# Patient Record
Sex: Male | Born: 1955 | Hispanic: No | Marital: Single | Smoking: Former smoker
Health system: Southern US, Community
[De-identification: ages and names within clinical notes are randomized; demographics above are authoritative.]

## PROBLEM LIST (undated history)

## (undated) DIAGNOSIS — E119 Type 2 diabetes mellitus without complications: Secondary | ICD-10-CM

## (undated) DIAGNOSIS — I10 Essential (primary) hypertension: Secondary | ICD-10-CM

## (undated) DIAGNOSIS — B1920 Unspecified viral hepatitis C without hepatic coma: Secondary | ICD-10-CM

## (undated) HISTORY — DX: Essential (primary) hypertension: I10

## (undated) HISTORY — DX: Unspecified viral hepatitis C without hepatic coma: B19.20

## (undated) HISTORY — DX: Type 2 diabetes mellitus without complications: E11.9

---

## 2000-11-26 ENCOUNTER — Emergency Department (HOSPITAL_COMMUNITY): Admission: EM | Admit: 2000-11-26 | Discharge: 2000-11-26 | Payer: Self-pay | Admitting: Emergency Medicine

## 2012-05-09 ENCOUNTER — Ambulatory Visit (INDEPENDENT_AMBULATORY_CARE_PROVIDER_SITE_OTHER): Payer: 59 | Admitting: Physician Assistant

## 2012-05-09 ENCOUNTER — Encounter: Payer: Self-pay | Admitting: Physician Assistant

## 2012-05-09 VITALS — BP 120/70 | HR 75 | Temp 98.3°F | Resp 18 | Ht 60.0 in | Wt 160.8 lb

## 2012-05-09 DIAGNOSIS — R768 Other specified abnormal immunological findings in serum: Secondary | ICD-10-CM | POA: Insufficient documentation

## 2012-05-09 DIAGNOSIS — J069 Acute upper respiratory infection, unspecified: Secondary | ICD-10-CM

## 2012-05-09 MED ORDER — AZITHROMYCIN 250 MG PO TABS: ORAL_TABLET | ORAL | Status: DC

## 2012-05-09 MED ORDER — MAGIC MOUTHWASH W/LIDOCAINE: 5.0000 mL | Freq: Four times a day (QID) | ORAL | Status: DC | PRN

## 2012-05-09 MED ORDER — FLUTICASONE PROPIONATE 50 MCG/ACT NA SUSP: 2.0000 | Freq: Every day | NASAL | Status: DC

## 2012-05-09 NOTE — Progress Notes (Signed)
  Subjective:    Patient ID: [name] [name], male    DOB: 1956-07-28, 56 y.o.   MRN: [id number]  HPI 56 yo male here for 2 week h/o nasal congestion, cough, occasional sneezing, sore throat.  No f/c.   Review of Systems  All other systems reviewed and are negative.       Objective:   Physical Exam  Nursing note and vitals reviewed. Constitutional: He is oriented to person, place, and time. He appears well-developed and well-nourished.  HENT:  Head: Normocephalic and atraumatic.  Mouth/Throat: No oropharyngeal exudate (throat w/erythema but no exudate).       B ears w/ congestion, no infxn. Turbinates enlarged and inflamed.  Neck: Normal range of motion. Neck supple. No thyromegaly present.  Cardiovascular: Normal rate, regular rhythm and normal heart sounds.   Pulmonary/Chest: Effort normal and breath sounds normal.  Lymphadenopathy:    He has no cervical adenopathy.  Neurological: He is alert and oriented to person, place, and time.  Skin: Skin is warm.       Assessment & Plan:  URI-cover for atypicals due to length of illness.  See meds.

## 2014-02-23 ENCOUNTER — Ambulatory Visit: Payer: 59

## 2014-02-23 ENCOUNTER — Ambulatory Visit (INDEPENDENT_AMBULATORY_CARE_PROVIDER_SITE_OTHER): Payer: 59 | Admitting: Family Medicine

## 2014-02-23 VITALS — BP 132/74 | HR 70 | Temp 98.2°F | Resp 18 | Ht 60.5 in | Wt 156.8 lb

## 2014-02-23 DIAGNOSIS — R079 Chest pain, unspecified: Secondary | ICD-10-CM

## 2014-02-23 DIAGNOSIS — J309 Allergic rhinitis, unspecified: Secondary | ICD-10-CM

## 2014-02-23 DIAGNOSIS — Z23 Encounter for immunization: Secondary | ICD-10-CM

## 2014-02-23 DIAGNOSIS — R040 Epistaxis: Secondary | ICD-10-CM

## 2014-02-23 DIAGNOSIS — M25539 Pain in unspecified wrist: Secondary | ICD-10-CM

## 2014-02-23 DIAGNOSIS — M25532 Pain in left wrist: Principal | ICD-10-CM

## 2014-02-23 DIAGNOSIS — R209 Unspecified disturbances of skin sensation: Secondary | ICD-10-CM

## 2014-02-23 DIAGNOSIS — R202 Paresthesia of skin: Secondary | ICD-10-CM

## 2014-02-23 DIAGNOSIS — M25531 Pain in right wrist: Secondary | ICD-10-CM

## 2014-02-23 DIAGNOSIS — M778 Other enthesopathies, not elsewhere classified: Secondary | ICD-10-CM

## 2014-02-23 DIAGNOSIS — G5600 Carpal tunnel syndrome, unspecified upper limb: Secondary | ICD-10-CM

## 2014-02-23 LAB — POCT CBC
Granulocyte percent: 63.1 %G (ref 37–80)
HCT, POC: 36 % — AB (ref 43.5–53.7)
Hemoglobin: 10.5 g/dL — AB (ref 14.1–18.1)
Lymph, poc: 2 (ref 0.6–3.4)
MCH, POC: 23.3 pg — AB (ref 27–31.2)
MCHC: 29.2 g/dL — AB (ref 31.8–35.4)
MCV: 80.1 fL (ref 80–97)
MID (cbc): 0.7 (ref 0–0.9)
MPV: 10.9 fL (ref 0–99.8)
POC Granulocyte: 4.5 (ref 2–6.9)
POC LYMPH PERCENT: 27.7 %L (ref 10–50)
POC MID %: 9.2 %M (ref 0–12)
Platelet Count, POC: 175 10*3/uL (ref 142–424)
RBC: 4.5 M/uL — AB (ref 4.69–6.13)
RDW, POC: 14.4 %
WBC: 7.1 10*3/uL (ref 4.6–10.2)

## 2014-02-23 MED ORDER — GABAPENTIN 300 MG PO CAPS: 300.0000 mg | ORAL_CAPSULE | Freq: Every day | ORAL | Status: DC

## 2014-02-23 MED ORDER — FLUTICASONE PROPIONATE 50 MCG/ACT NA SUSP: 2.0000 | Freq: Every day | NASAL | Status: DC

## 2014-02-23 MED ORDER — MELOXICAM 15 MG PO TABS: 15.0000 mg | ORAL_TABLET | Freq: Every day | ORAL | Status: DC

## 2014-02-23 NOTE — Patient Instructions (Addendum)
1.  START B COMPLEX VITAMIN --- ONE TABLET DAILY.  H?i Ch?ng ?ng C? Tay (Carpal Tunnel Syndrome) ?ng c? tay l m?t vng h?p n?m ? m?t lng bn tay c?a c? tay. ?ng ???c hnh thnh b?i cc x??ng c? tay v dy ch?ng. Dy th?n kinh, m?ch mu v gn ?i qua ?ng c? tay. C? ??ng c? tay l?p ?i l?p l?i ho?c cc b?nh l nh?t ??nh c th? gy s?ng ? bn trong ?ng ny. Ch? s?ng ny b ch?t dy th?n kinh chnh ? c? tay (dy th?n kinh gi?a) v gy tnh tr?ng ?au ? bn tay v cnh tay ???c g?i l h?i ch?ng ?ng c? tay.  NGUYN NHN  C? ??ng c? tay l?p ?i l?p l?i.  Ch?n th??ng c? tay.  M?t s? b?nh nh?t ??nh nh? vim kh?p, b?nh ti?u ???ng, nghi?n r??u, c??ng gip v suy th?n.  Bo ph.  Mang thai. TRI?U CH?NG  C?m gic "r?n r?n nh? ki?n b" ? cc ngn tay ho?c bn tay.  ?au bu?t ho?c t ? ngn tay ho?c bn tay.  C?m gic ?au nh?c ? ton b? cnh tay.  ?au c? tay lan ln cnh tay t?i vai.  ?au lan xu?ng lng bn tay ho?c ngn tay.  C?m gic y?u ? bn tay. CH?N ?ON Chuyn gia ch?m [place] s?c kh?e s? xem b?nh s? c?a b?n v khm th?c th?. C th? c?n ph?i ki?m tra ?i?n c? ??. Ki?m tra ny ?o tn hi?u ?i?n do cc c? g?i ?i. Cc tn hi?u ?i?n ny th??ng b? ch?m l?i b?i h?i ch?ng ?ng c? tay. B?n c?ng c th? c?n ph?i ch?p X-quang. ?I?U TR? H?i ch?ng ?ng c? tay c th? t? kh?i. Chuyn gia ch?m [place] s?c kh?e c?a b?n c th? khuy?n ngh? n?p c? tay ho?c s? d?ng thu?c, ch?ng h?n nh? thu?c khng vim khng c steroid. Tim cortison c th? h?u ch. ?i khi, c th? c?n ph?i ph?u thu?t ?? gi?i phng dy th?n kinh b? b ch?t. H??NG D?N CH?M [place] T?I NH  S? d?ng t?t c? thu?c theo ch? d?n c?a chuyn gia ch?m [place] s?c kh?e. Ch? s? d?ng thu?c khng c?n k toa ho?c thu?c c?n k toa ?? gi?m ?au, gi?m c?m gic kh ch?u ho?c h? s?t theo ch? d?n c?a chuyn gia ch?m  s?c kh?e c?a b?n.  N?u b?n ???c n?p ?? gi? cho c? tay khng b? cong, hy s? d?ng n?p theo ch? d?n. ?i?u quan tr?ng l c?n mang n?p vo ban ?m. S? d?ng n?p cho ??n  ch?ng no b?n cn b? ?au ho?c t ? bn tay, cnh tay ho?c c? tay. C th? m?t 1 ??n 2 thng.  Khng s? d?ng c? tay vo b?t k? ho?t ??ng no c th? gy ?au. N?u cc tri?u ch?ng c?a b?n c lin quan ??n cng vi?c, b?n c th? c?n ph?i ni chuy?n v?i ng??i s? d?ng lao ??ng c?a mnh v? vi?c ??i sang m?t cng vi?c khng c?n s? d?ng c? tay.  Ch??m ? l?nh ln c? tay sau th?i gian ho?t ??ng c? tay ko di.  Cho ? l?nh vo ti nh?a.  ??t kh?n t?m gi?a da v ti.  Ch??m ? l?nh trong 15 ??n 20 pht, 3 ??n 4 l?n m?i ngy.  Gi? m?i cu?c h?n khm l?i theo ch? d?n c?a chuyn gia ch?m  s?c kh?e. Cc cu?c h?n ny bao g?m b?t k? cu?c h?n chuy?n khm chuyn khoa ch?nh hnh,  v?t l tr? li?u v ph?c h?i ch?c n?ng no. B?t k? s? ch?m tr? no trong vi?c nh?n ch?m [place] c?n thi?t c th? d?n ??n s? ch?m tr? ho?c tnh tr?ng b?nh c?a b?n khng kh?i. HY NGAY L?P T?C ?I KHM N?U:  B?n c nh?ng tri?u ch?ng m?i khng r nguyn nhn.  Cc tri?u ch?ng c?a b?n tr? nn t?i t? h?n v khng c?i thi?n ho?c ki?m sot ???c b?ng thu?c. ??M B?O B?N:  Hi?u cc h??ng d?n ny.  S? theo di tnh tr?ng c?a mnh.  S? yu c?u tr? gip ngay l?p t?c n?u b?n c?m th?y khng ?? ho?c tnh tr?ng tr?m tr?ng h?n. Document Released: 12/04/2005 Document Revised: 08/06/2013 [facility] Patient Information 2014 [place], [place].

## 2014-02-23 NOTE — Progress Notes (Addendum)
Subjective:    Patient ID: [name] [name], male    DOB: Mar 20, 1956, 58 y.o.   MRN: [id number] This chart was scribed for [name] Simmer, MD by Nicholos Johns, Medical Scribe. This patient's care was started at 8:01 PM.  02/23/2014  Pain, Epistaxis and Flu Vaccine  Epistaxis    HPI Comments: [name] [name] is a 58 y.o. male who presents to the [facility] complaining of pain, tingling, weakness, and numbness in the hands and wrists; worse on the right, onset 1 month ago. Describes pain like a bee sting. Denies any injury related to these symptoms. Has taken Advil and Aleve for pain with no relief. Has not applied ice or heat.   Also reports excessive epistaxis episodes especially at night. 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 Will also have occasional HA; states he had one yesterday but does not have one today.   Reports intermittent chest pain that has been off and on since 2005. Will sometimes feel it when he rotates side to side.   Has been diagnosed with Hepatitis C.   Only child. Father still alive. States mother died in 76. Currently has a girlfriend in which they share 3 children ages 73, 31, and 58. Also has 3 children back in [place]. Quit smoking in 1994. Drinks beer occasionally. Denies drug usage. Denies any other medical problems or recent surgeries. Denies neck pain.   Pt would also like a flu vaccine.   Review of Systems  Constitutional: Negative for fever, chills, diaphoresis and fatigue.  HENT: Positive for nosebleeds.   Respiratory: Negative for cough and shortness of breath.   Cardiovascular: Positive for chest pain.  Gastrointestinal: Negative for blood in stool.  Genitourinary: Negative for hematuria.  Musculoskeletal: Positive for arthralgias (wrists). Negative for neck pain.  Skin: Negative for rash.  Neurological: Positive for headaches. Negative for dizziness, tremors, seizures, syncope,  facial asymmetry, speech difficulty, weakness, light-headedness and numbness.   History reviewed. No pertinent past medical history. No Known Allergies Current Outpatient Prescriptions  Medication Sig Dispense Refill   Alum & Mag Hydroxide-Simeth (MAGIC MOUTHWASH W/LIDOCAINE) SOLN Take 5 mLs by mouth 4 (four) times daily as needed.  120 mL  0   DM-Doxylamine-Acetaminophen (NYQUIL COLD & FLU PO) Take by mouth every 24 hours x 2 doses.       fluticasone (FLONASE) 50 MCG/ACT nasal spray Place 2 sprays into the nose daily.  16 g  6   gabapentin (NEURONTIN) 300 MG capsule Take 1 capsule (300 mg total) by mouth at bedtime.  30 capsule  1   meloxicam (MOBIC) 15 MG tablet Take 1 tablet (15 mg total) by mouth daily.  30 tablet  0   No current facility-administered medications for this visit.       Objective:    BP 132/74   Pulse 70   Temp(Src) 98.2 F (36.8 C) (Oral)   Resp 18   Ht 5' 0.5" (1.537 m)   Wt 156 lb 12.8 oz (71.124 kg)   BMI 30.11 kg/m2   SpO2 98% Physical Exam  Vitals reviewed. Constitutional: He is oriented to person, place, and time. He appears well-developed and well-nourished. No distress.  HENT:  Head: Normocephalic and atraumatic.  Right Ear: External ear normal.  Left Ear: External ear normal.  Mouth/Throat: Oropharynx is clear and moist.  Drainage in nares bilaterally.   Eyes: Conjunctivae and EOM are  normal. Pupils are equal, round, and reactive to light.  Neck: Neck supple.  Cardiovascular: Normal rate, regular rhythm and normal heart sounds.   No murmur heard. Pulmonary/Chest: Effort normal. No respiratory distress.  Abdominal: Soft. There is no tenderness.  Musculoskeletal: Normal range of motion.  Full ROM of lumbar and cervical spine. Full ROM of the shoulders. Normal supination and pronation of the hands bilaterally. Pain with flexion and extension of the lateral wrist. Mild tenderness to palpation of the lateral epicondyle bilaterally.   Lymphadenopathy:      He has no cervical adenopathy.  Neurological: He is alert and oriented to person, place, and time.  Skin: Skin is warm and dry.  Psychiatric: He has a normal mood and affect. His behavior is normal.   Results for orders placed in visit on 02/23/14  POCT CBC      Result Value Ref Range   WBC 7.1  4.6 - 10.2 K/uL   Lymph, poc 2.0  0.6 - 3.4   POC LYMPH PERCENT 27.7  10 - 50 %L   MID (cbc) 0.7  0 - 0.9   POC MID % 9.2  0 - 12 %M   POC Granulocyte 4.5  2 - 6.9   Granulocyte percent 63.1  37 - 80 %G   RBC 4.50 (*) 4.69 - 6.13 M/uL   Hemoglobin 10.5 (*) 14.1 - 18.1 g/dL   HCT, POC 47.836.0 (*) 29.543.5 - 53.7 %   MCV 80.1  80 - 97 fL   MCH, POC 23.3 (*) 27 - 31.2 pg   MCHC 29.2 (*) 31.8 - 35.4 g/dL   RDW, POC 62.114.4     Platelet Count, POC 175  142 - 424 K/uL   MPV 10.9  0 - 99.8 fL   EKG: NSR; no acute ST changes.  [facility] reading (PRIMARY) by  Dr. Katrinka BlazingSmith.  R WRIST:  NAD   Assessment & Plan:  Pain in both wrists - Plan: DG Wrist Complete Right  Paresthesias - Plan: POCT CBC  Chest pain - Plan: Comprehensive metabolic panel, EKG 12-Lead  Epistaxis - Plan: POCT CBC  Allergic rhinitis, cause unspecified  Need for prophylactic vaccination and inoculation against influenza - Plan: Flu Vaccine QUAD 36+ mos IM  Carpal tunnel syndrome  Wrist tendonitis   1. Carpal tunnel syndrome versus wrist tendonitis: New. Etiology to B wrist pain with paresthesias; rx for Meloxicam and Gabapentin provided; recommend wrist splints qhs.  If no improvement in one month, call office for ortho referral.  Recommend starting B complex daily.  2.  Chest pain: New.  EKG normal; suggestive of musculoskeletal etiology because changing positions can trigger pain. 3.  Epistaxis: New.  Recommend humidifier qhs and nasal saline each nare bid.   4. Allergic Rhinitis: uncontrolled; recommend Claritin or Zyrtec daily. 5.  S/p flu vaccine.  6. Anemia: New to this provider; will warrant repeat at next visit.  Will also  need to clarify if history of colonoscopy.  May be chronic issue for patient with hemoglobinopathy?  Language barrier thus history is difficult. 7. Hepatitis C: pt reports history of Hepatitis C; did not go into detail at this visit regarding previous treatment or referral to hepatitis clinic; will warrant clarification.    Meds ordered this encounter  Medications   meloxicam (MOBIC) 15 MG tablet    Sig: Take 1 tablet (15 mg total) by mouth daily.    Dispense:  30 tablet    Refill:  0   gabapentin (NEURONTIN)  300 MG capsule    Sig: Take 1 capsule (300 mg total) by mouth at bedtime.    Dispense:  30 capsule    Refill:  1    No Follow-up on file.  I personally performed the services described in this documentation, which was scribed in my presence.  The recorded information has been reviewed and is accurate.  [name] [name], M.D.  [facility] [address] [place], [place]  [address] [phone] phone [phone] fax

## 2014-02-24 LAB — COMPREHENSIVE METABOLIC PANEL
ALT: 31 U/L (ref 0–53)
AST: 45 U/L — ABNORMAL HIGH (ref 0–37)
Albumin: 3.7 g/dL (ref 3.5–5.2)
Alkaline Phosphatase: 84 U/L (ref 39–117)
BUN: 11 mg/dL (ref 6–23)
CO2: 25 mEq/L (ref 19–32)
Calcium: 8.5 mg/dL (ref 8.4–10.5)
Chloride: 97 mEq/L (ref 96–112)
Creat: 0.8 mg/dL (ref 0.50–1.35)
Glucose, Bld: 251 mg/dL — ABNORMAL HIGH (ref 70–99)
Potassium: 4 mEq/L (ref 3.5–5.3)
Sodium: 129 mEq/L — ABNORMAL LOW (ref 135–145)
Total Bilirubin: 0.9 mg/dL (ref 0.2–1.2)
Total Protein: 9.3 g/dL — ABNORMAL HIGH (ref 6.0–8.3)

## 2014-02-28 ENCOUNTER — Encounter: Payer: Self-pay | Admitting: *Deleted

## 2014-03-02 ENCOUNTER — Ambulatory Visit (INDEPENDENT_AMBULATORY_CARE_PROVIDER_SITE_OTHER): Payer: 59 | Admitting: Family Medicine

## 2014-03-02 VITALS — BP 114/72 | HR 64 | Temp 97.9°F | Resp 18 | Wt 158.0 lb

## 2014-03-02 DIAGNOSIS — M25539 Pain in unspecified wrist: Secondary | ICD-10-CM

## 2014-03-02 DIAGNOSIS — T148XXA Other injury of unspecified body region, initial encounter: Secondary | ICD-10-CM

## 2014-03-02 DIAGNOSIS — X503XXA Overexertion from repetitive movements, initial encounter: Secondary | ICD-10-CM

## 2014-03-02 MED ORDER — PREDNISONE 20 MG PO TABS: 40.0000 mg | ORAL_TABLET | Freq: Every day | ORAL | Status: DC

## 2014-03-02 NOTE — Progress Notes (Signed)
Patient ID: [name] [name] MRN: [id number], DOB: August 26, 1956, 58 y.o. Date of Encounter: 03/02/2014, 6:46 PM  Primary Physician: No primary provider on file.  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 Pt was seen by Dr. Katrinka Blazing one weeks ago for similar symptoms. Pt was prescribed Meloxicam and gabapentin. He states that his Epistaxis has resolved. Pt states that his right wrist pain started 1 month ago. Mr. 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 After sanding the wood he states that he experiences a throbbing pain and numbness.    No past medical history on file.   Home Meds: Prior to Admission medications   Medication Sig Start Date End Date Taking? Authorizing Provider  fluticasone (FLONASE) 50 MCG/ACT nasal spray Place 2 sprays into both nostrils daily. 02/23/14 02/23/15 Yes [name] Chick, MD  gabapentin (NEURONTIN) 300 MG capsule Take 1 capsule (300 mg total) by mouth at bedtime. 02/23/14  Yes [name] Chick, MD  meloxicam (MOBIC) 15 MG tablet Take 1 tablet (15 mg total) by mouth daily. 02/23/14  Yes [name] Chick, MD    Allergies: No Known Allergies  History   Social History   Marital Status: Single    Spouse Name: N/A    Number of Children: 3   Years of Education: N/A   Occupational History   WOOD WORK    Social History Main Topics   Smoking status: Former Smoker    Quit date: 05/09/1996   Smokeless tobacco: Never Used   Alcohol Use: Yes     Comment: social   Drug Use: No   Sexual Activity: Yes   Other Topics Concern   Not on file   Social History Narrative   Marital status: single; dating girlfriend (age 43) x 11 years.  From [place].      Children: 3 sons (12, 15, 3) in [place]; 3 children in [place].      Employment: wood work      Tobacco: none; quit 1994.      Alcohol:  Beer on weekends.    Drugs: none          Review of Systems: right wrist pain, right arm pain Constitutional: negative for chills, fever, night sweats, weight changes, or fatigue  HEENT: negative for vision changes, hearing loss, congestion, rhinorrhea, ST, epistaxis, or sinus pressure Cardiovascular: negative for chest pain or palpitations Respiratory: negative for hemoptysis, wheezing, shortness of breath, or cough Abdominal: negative for abdominal pain, nausea, vomiting, diarrhea, or constipation Dermatological: negative for rash Neurologic: negative for headache, dizziness, or syncope All other systems reviewed and are otherwise negative with the exception to those above and in the HPI.   Physical Exam: Blood pressure 114/72, pulse 64, temperature 97.9 F (36.6 C), temperature source Oral, resp. rate 18, weight 158 lb (71.668 kg), SpO2 98.00%., Body mass index is 30.34 kg/(m^2). General: Well developed, well nourished, in no acute distress. Head: Normocephalic, atraumatic, eyes without discharge, sclera non-icteric, nares are without discharge. Bilateral auditory canals clear, TM's are without perforation, pearly grey and translucent with reflective cone of light bilaterally. Oral cavity moist, posterior pharynx without exudate, erythema, peritonsillar abscess, or post nasal drip.  Neck: Supple. No thyromegaly. Full ROM. No lymphadenopathy. Lungs: Clear bilaterally to auscultation without wheezes, rales, or rhonchi. Breathing is unlabored.  Heart: RRR with S1 S2. No murmurs, rubs, or gallops appreciated. Abdomen: Soft, non-tender, non-distended with normoactive bowel sounds. No hepatomegaly. No rebound/guarding. No obvious abdominal masses. Msk:  Strength and tone normal for age. Extremities/Skin: Warm and dry. No clubbing or cyanosis. No edema. No rashes or suspicious lesions. Neuro: Alert and oriented X 3. Moves all extremities spontaneously. Gait is normal. CNII-XII grossly in tact. Psych:  Responds to  questions appropriately with a normal affect.    ASSESSMENT AND PLAN:  58 y.o. 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

## 2014-07-28 ENCOUNTER — Ambulatory Visit (INDEPENDENT_AMBULATORY_CARE_PROVIDER_SITE_OTHER): Payer: 59 | Admitting: Internal Medicine

## 2014-07-28 ENCOUNTER — Ambulatory Visit (INDEPENDENT_AMBULATORY_CARE_PROVIDER_SITE_OTHER): Payer: 59

## 2014-07-28 VITALS — BP 152/90 | HR 57 | Temp 98.5°F | Resp 20 | Ht 60.5 in | Wt 150.0 lb

## 2014-07-28 DIAGNOSIS — R1013 Epigastric pain: Secondary | ICD-10-CM

## 2014-07-28 DIAGNOSIS — D649 Anemia, unspecified: Secondary | ICD-10-CM

## 2014-07-28 DIAGNOSIS — R7309 Other abnormal glucose: Secondary | ICD-10-CM

## 2014-07-28 DIAGNOSIS — IMO0001 Reserved for inherently not codable concepts without codable children: Secondary | ICD-10-CM

## 2014-07-28 DIAGNOSIS — R030 Elevated blood-pressure reading, without diagnosis of hypertension: Secondary | ICD-10-CM

## 2014-07-28 DIAGNOSIS — E119 Type 2 diabetes mellitus without complications: Secondary | ICD-10-CM

## 2014-07-28 DIAGNOSIS — R161 Splenomegaly, not elsewhere classified: Secondary | ICD-10-CM

## 2014-07-28 DIAGNOSIS — R894 Abnormal immunological findings in specimens from other organs, systems and tissues: Secondary | ICD-10-CM

## 2014-07-28 DIAGNOSIS — R739 Hyperglycemia, unspecified: Secondary | ICD-10-CM

## 2014-07-28 DIAGNOSIS — R768 Other specified abnormal immunological findings in serum: Secondary | ICD-10-CM

## 2014-07-28 LAB — POCT CBC
Granulocyte percent: 76.9 %G (ref 37–80)
HEMATOCRIT: 41.9 % — AB (ref 43.5–53.7)
HEMOGLOBIN: 12.2 g/dL — AB (ref 14.1–18.1)
LYMPH, POC: 1.2 (ref 0.6–3.4)
MCH: 23.8 pg — AB (ref 27–31.2)
MCHC: 29.2 g/dL — AB (ref 31.8–35.4)
MCV: 81.5 fL (ref 80–97)
MID (cbc): 0.5 (ref 0–0.9)
MPV: 8.9 fL (ref 0–99.8)
POC GRANULOCYTE: 5.7 (ref 2–6.9)
POC LYMPH PERCENT: 16.1 %L (ref 10–50)
POC MID %: 7 %M (ref 0–12)
Platelet Count, POC: 126 10*3/uL — AB (ref 142–424)
RBC: 5.14 M/uL (ref 4.69–6.13)
RDW, POC: 15.3 %
WBC: 7.04 10*3/uL (ref 4.6–10.2)

## 2014-07-28 LAB — POCT GLYCOSYLATED HEMOGLOBIN (HGB A1C): HEMOGLOBIN A1C: 8

## 2014-07-28 MED ORDER — LISINOPRIL 10 MG PO TABS: 10.0000 mg | ORAL_TABLET | Freq: Every day | ORAL | Status: DC

## 2014-07-28 MED ORDER — POLYETHYLENE GLYCOL 3350 17 GM/SCOOP PO POWD: ORAL | Status: DC

## 2014-07-28 MED ORDER — ONDANSETRON HCL 4 MG PO TABS: 4.0000 mg | ORAL_TABLET | Freq: Three times a day (TID) | ORAL | Status: DC | PRN

## 2014-07-28 MED ORDER — METFORMIN HCL 500 MG PO TABS: 500.0000 mg | ORAL_TABLET | Freq: Two times a day (BID) | ORAL | Status: DC

## 2014-07-28 NOTE — Patient Instructions (Signed)
Return next Tuesday 8/18 at 10am

## 2014-07-28 NOTE — Progress Notes (Addendum)
Subjective:    Patient ID: [name] [name], male    DOB: Jan 22, 1956, 58 y.o.   MRN: [id number] This chart was scribed for Ellamae Siaobert Leilyn Frayre, MD by Evon Slackerrance Branch, ED Scribe. This Patient was seen in room 13 and the patients care was started at 8:38 PM  Abdominal Pain Associated symptoms include constipation and headaches. Pertinent negatives include no diarrhea, dysuria, fever or vomiting.   Chief Complaint  Patient presents with  . Abdominal Pain    pt stated that he is having abd pain/heartburn that started yesterday.  worse when he lays down, not able to eat or drink.    HPI Comments: [name] [name] is a 58 y.o. male who presents to the [facility] complaining of abdominal pain onset 1 day prior. HE states he has associated constipation, headache and right sided chest pain. He states that he was having some difficulty at work yesterday due to the pain. Denies vomiting, diarrhea, fever, chills, palpations, SOB, or dysuria. He states he has a Hx of Hepatitis C.  Hx Rec abd pain and HA over the years.  Review of chart shows recent visit in March where blood sugar was abnormal/he was lost to contact followup Language barrier significant for phone calls Hemoglobin 10.5 that time 1 abnormal liver test/platelets normal  Patient Active Problem List   Diagnosis Date Noted  . Hepatitis C antibody test positive 05/09/2012   chart revealed [facility] Dr. Trixie DeisNuez infectious disease 2009-genotypes 6 Cryoglobulins positive2 No followup He was immunized against hepatitis A/has antibodies to hepatitis B  Further review of the old chart Evaluation by Dr. Link SnuffereveSchwar for chronic costochondral and other joint pains 2009--low positive ANA. 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 Ellis Parentsaub's exam [phone] but f/u not in chart   Prior to Admission medications   Medication Sig Start Date End Date Taking? Authorizing Provider  aspirin-sod bicarb-citric acid (ALKA-SELTZER) 325 MG TBEF tablet Take 325 mg by mouth every 6 (six) hours as needed.   Yes Historical Provider, MD  fluticasone (FLONASE) 50 MCG/ACT nasal spray Place 2 sprays into both nostrils daily. 02/23/14 02/23/15 Yes Ethelda ChickKristi M Smith, MD  gabapentin (NEURONTIN) 300 MG capsule Take 1 capsule (300 mg total) by mouth at bedtime. 02/23/14  Yes Ethelda ChickKristi M Smith, MD  meloxicam (MOBIC) 15 MG tablet Take 1 tablet (15 mg total) by mouth daily. 02/23/14  Yes Ethelda ChickKristi M Smith, MD  predniSONE (DELTASONE) 20 MG tablet Take 2 tablets (40 mg total) by mouth daily. 03/02/14  Yes Elvina SidleKurt Lauenstein, MD    Review of Systems  Constitutional: Negative for fever and chills.  HENT: Positive for nosebleeds.   Respiratory: Negative for shortness of breath.   Cardiovascular: Positive for chest pain. Negative for palpitations.  Gastrointestinal: Positive for abdominal pain and constipation. Negative for vomiting and diarrhea.  Genitourinary: Negative for dysuria.  Neurological: Positive for headaches.    Objective:    Physical Exam  Nursing note and vitals reviewed. Constitutional: He is oriented to person, place, and time. He appears well-developed and well-nourished. No distress.  HENT:  Head: Normocephalic and atraumatic.  Eyes: Conjunctivae and EOM are normal.  Neck: Neck supple. No thyromegaly present.  Cardiovascular: Normal rate and regular rhythm.   Murmur heard.  Systolic murmur is present  Soft systolic murmur at apex of heart.   Pulmonary/Chest: Effort normal and breath sounds normal. No respiratory distress.  Abdominal: Soft. Bowel sounds are increased.  Firm mass LUQ-LMQ ? Spleen Liver not enlarged No other  masses tenderLMQ/LLQ  Musculoskeletal: Normal range of motion.  Lymphadenopathy:    He has no cervical adenopathy.  Neurological: He is alert and oriented to person, place, and time.  Skin: Skin is warm and dry.  Psychiatric: He has a normal mood and affect. His behavior is normal.   [facility] reading (PRIMARY) by  Dr. Merla Riches no obstruction/? Splenomegaly/excessive stool burden  Results for orders placed in visit on 07/28/14  COMPREHENSIVE METABOLIC PANEL      Result Value Ref Range   Sodium 133 (*) 135 - 145 mEq/L   Potassium 4.1  3.5 - 5.3 mEq/L   Chloride 101  96 - 112 mEq/L   CO2 26  19 - 32 mEq/L   Glucose, Bld 302 (*) 70 - 99 mg/dL   BUN 8  6 - 23 mg/dL   Creat 9.51  8.84 - 1.66 mg/dL   Total Bilirubin 1.1  0.2 - 1.2 mg/dL   Alkaline Phosphatase 77  39 - 117 U/L   AST 61 (*) 0 - 37 U/L   ALT 43  0 - 53 U/L   Total Protein 8.7 (*) 6.0 - 8.3 g/dL   Albumin 3.7  3.5 - 5.2 g/dL   Calcium 9.0  8.4 - 06.3 mg/dL  LIPID PANEL      Result Value Ref Range   Cholesterol 109  0 - 200 mg/dL   Triglycerides 016 (*) <150 mg/dL   HDL 35 (*) >01 mg/dL   Total CHOL/HDL Ratio 3.1     VLDL 63 (*) 0 - 40 mg/dL   LDL Cholesterol 11  0 - 99 mg/dL  POCT CBC      Result Value Ref Range   WBC 7.04  4.6 - 10.2 K/uL   Lymph, poc 1.2  0.6 - 3.4   POC LYMPH PERCENT 16.1  10 - 50 %L   MID (cbc) 0.5  0 - 0.9   POC MID % 7.0  0 - 12 %M   POC Granulocyte 5.7  2 - 6.9   Granulocyte percent 76.9  37 - 80 %G   RBC 5.14  4.69 - 6.13 M/uL   Hemoglobin 12.2 (*) 14.1 - 18.1 g/dL   HCT, POC 09.3 (*) 23.5 - 53.7 %   MCV 81.5  80 - 97 fL   MCH, POC 23.8 (*) 27 - 31.2 pg   MCHC 29.2 (*) 31.8 - 35.4 g/dL   RDW, POC 57.3     Platelet Count, POC 126 (*) 142 - 424 K/uL   MPV 8.9  0 - 99.8 fL  POCT GLYCOSYLATED HEMOGLOBIN (HGB A1C)      Result Value Ref Range   Hemoglobin A1C 8.0      45 min OV Assessment & Plan:  Abdominal pain, epigastric -  Likely secondary to constipation with possible  relationship to hyperglycemia  MiraLax twice a day til successful than once a day  Anemia, unspecified anemia type  This has varied with microcytosis at times since 1997--very likely hemoglobin E or a variant  Hep C by Hx without followup--he says he was treated but there is no record in the [facility] system  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 aspirin-sod bicarb-citric acid (ALKA-SELTZER) 325 MG TBEF tablet    Sig: Take 325 mg by mouth every 6 (six) hours as needed.  . metFORMIN (GLUCOPHAGE) 500 MG tablet    Sig: Take 1 tablet (500 mg total) by mouth 2 (two) times daily with a meal.    Dispense:  60 tablet    Refill:  0  . lisinopril (PRINIVIL,ZESTRIL) 10 MG tablet    Sig: Take 1 tablet (10 mg total) by mouth daily.    Dispense:  90 tablet    Refill:  3  . ondansetron (ZOFRAN) 4 MG tablet    Sig: Take 1 tablet (4 mg total) by mouth every 8 (eight) hours as needed for nausea or vomiting.    Dispense:  20 tablet    Refill:  0  . polyethylene glycol powder (GLYCOLAX/MIRALAX) powder    Sig: 1 scoop in 8 oz water bid for 3 days then qd for 7 days    Dispense:  255 g    Refill:  0   Reck 1 week   I have completed the patient encounter in its entirety as documented by the scribe, with editing by me where necessary. Christyanna Mckeon P. Merla Riches, M.D.

## 2014-07-29 DIAGNOSIS — D500 Iron deficiency anemia secondary to blood loss (chronic): Secondary | ICD-10-CM | POA: Insufficient documentation

## 2014-07-29 DIAGNOSIS — D638 Anemia in other chronic diseases classified elsewhere: Secondary | ICD-10-CM | POA: Insufficient documentation

## 2014-07-29 DIAGNOSIS — R161 Splenomegaly, not elsewhere classified: Secondary | ICD-10-CM | POA: Insufficient documentation

## 2014-07-29 LAB — COMPREHENSIVE METABOLIC PANEL
ALT: 43 U/L (ref 0–53)
AST: 61 U/L — ABNORMAL HIGH (ref 0–37)
Albumin: 3.7 g/dL (ref 3.5–5.2)
Alkaline Phosphatase: 77 U/L (ref 39–117)
BUN: 8 mg/dL (ref 6–23)
CO2: 26 meq/L (ref 19–32)
Calcium: 9 mg/dL (ref 8.4–10.5)
Chloride: 101 mEq/L (ref 96–112)
Creat: 0.76 mg/dL (ref 0.50–1.35)
GLUCOSE: 302 mg/dL — AB (ref 70–99)
Potassium: 4.1 mEq/L (ref 3.5–5.3)
SODIUM: 133 meq/L — AB (ref 135–145)
TOTAL PROTEIN: 8.7 g/dL — AB (ref 6.0–8.3)
Total Bilirubin: 1.1 mg/dL (ref 0.2–1.2)

## 2014-07-29 LAB — LIPID PANEL
CHOL/HDL RATIO: 3.1 ratio
Cholesterol: 109 mg/dL (ref 0–200)
HDL: 35 mg/dL — AB (ref >39)
LDL CALC: 11 mg/dL (ref 0–99)
Triglycerides: 314 mg/dL — ABNORMAL HIGH (ref <150)
VLDL: 63 mg/dL — ABNORMAL HIGH (ref 0–40)

## 2014-08-04 ENCOUNTER — Ambulatory Visit (INDEPENDENT_AMBULATORY_CARE_PROVIDER_SITE_OTHER): Payer: 59

## 2014-08-04 ENCOUNTER — Ambulatory Visit (INDEPENDENT_AMBULATORY_CARE_PROVIDER_SITE_OTHER): Payer: 59 | Admitting: Internal Medicine

## 2014-08-04 VITALS — BP 118/76 | HR 68 | Temp 97.7°F | Resp 16 | Ht 60.5 in | Wt 148.8 lb

## 2014-08-04 DIAGNOSIS — R894 Abnormal immunological findings in specimens from other organs, systems and tissues: Secondary | ICD-10-CM

## 2014-08-04 DIAGNOSIS — R079 Chest pain, unspecified: Secondary | ICD-10-CM

## 2014-08-04 DIAGNOSIS — E119 Type 2 diabetes mellitus without complications: Secondary | ICD-10-CM

## 2014-08-04 DIAGNOSIS — R51 Headache: Secondary | ICD-10-CM

## 2014-08-04 DIAGNOSIS — R768 Other specified abnormal immunological findings in serum: Secondary | ICD-10-CM

## 2014-08-04 LAB — GLUCOSE, POCT (MANUAL RESULT ENTRY): POC GLUCOSE: 154 mg/dL — AB (ref 70–99)

## 2014-08-04 MED ORDER — METFORMIN HCL 500 MG PO TABS: 500.0000 mg | ORAL_TABLET | Freq: Two times a day (BID) | ORAL | Status: DC

## 2014-08-04 NOTE — Progress Notes (Signed)
Subjective:  This chart was scribed for Tonye Pearsonobert P Sherre Wooton, MD by Charline BillsEssence Howell, ED Scribe. The patient was seen in room 4. Patient's care was started at 11:18 AM.   Patient ID: [name] [name], male    DOB: July 12, 1956, 58 y.o.   MRN: [id number]  Chief Complaint  Patient presents with  . Follow-up    abdominal pain better but headache, dizziness   HPI HPI Comments: [name] [name] is a 58 y.o. male who presents to the [facility] for a follow-up regarding abdominal pain. Pt was seen one week ago by Dr. Merla Richesoolittle for abdominal pain onset 1 day prior to visit. Pt states that abdominal pain has improved but he reports associated HA, dizziness, eye pain, visual disturbances, dry throat, R sided mild chest pain. He denies cough. Pt states that he rubs medicine on his chest with relief. Last CXR in 2001.  Pt has not eaten today. He takes 2 Metformin tablets daily.  Pt is still working.   History reviewed. No pertinent past medical history. Current Outpatient Prescriptions on File Prior to Visit  Medication Sig Dispense Refill  . aspirin-sod bicarb-citric acid (ALKA-SELTZER) 325 MG TBEF tablet Take 325 mg by mouth every 6 (six) hours as needed.      . fluticasone (FLONASE) 50 MCG/ACT nasal spray Place 2 sprays into both nostrils daily.  16 g  11  . lisinopril (PRINIVIL,ZESTRIL) 10 MG tablet Take 1 tablet (10 mg total) by mouth daily.  90 tablet  3  . metFORMIN (GLUCOPHAGE) 500 MG tablet Take 1 tablet (500 mg total) by mouth 2 (two) times daily with a meal.  60 tablet  0  . ondansetron (ZOFRAN) 4 MG tablet Take 1 tablet (4 mg total) by mouth every 8 (eight) hours as needed for nausea or vomiting.  20 tablet  0  . polyethylene glycol powder (GLYCOLAX/MIRALAX) powder 1 scoop in 8 oz water bid for 3 days then qd for 7 days  255 g  0   No current facility-administered medications on file prior to visit.   No Known Allergies  Review of Systems  Eyes: Positive for pain and visual  disturbance.  Respiratory: Negative for cough.   Cardiovascular: Positive for chest pain.  Neurological: Positive for dizziness and headaches.      Objective:   Physical Exam  Nursing note and vitals reviewed. Constitutional: He is oriented to person, place, and time. He appears well-developed and well-nourished.  HENT:  Head: Normocephalic and atraumatic.  Nose: Nose normal.  Mouth/Throat: Oropharynx is clear and moist.  Eyes: Conjunctivae and EOM are normal. Pupils are equal, round, and reactive to light.  Neck: Neck supple.  Cardiovascular: Normal rate and regular rhythm.   No murmur heard. Pulmonary/Chest: Effort normal and breath sounds normal.  Clear to ausculation   Musculoskeletal: Normal range of motion.  Neurological: He is alert and oriented to person, place, and time.  Cranial nerves 2-12 intact  Skin: Skin is warm and dry.  Psychiatric: He has a normal mood and affect. His behavior is normal.  Triage Vitals: BP 118/76  Pulse 68  Temp(Src) 97.7 F (36.5 C) (Oral)  Resp 16  Ht 5' 0.5" (1.537 m)  Wt 148 lb 12.8 oz (67.495 kg)  BMI 28.57 kg/m2  SpO2 99%     [facility] reading (PRIMARY) by  Dr Glorianna Gott= lungs are clear/no bony lesions/heart size normal   Assessment & Plan:   I personally performed the services described in this documentation, which  was scribed in my presence. The recorded information has been reviewed and is accurate.  Hepatitis C antibody test positive - Plan: HCV RNA quant rflx ultra or genotype  Type II or unspecified type diabetes mellitus without mention of complication, not stated as uncontrolled--07/28/14 -   Responding to medication/hemoglobin A1c the first week in November  Blurred vision secondary to this  Chest pain, unspecified - musculoskeletal origin  Headache-unclear etiology/use Tylenol and follow  Meds ordered this encounter  Medications  . metFORMIN (GLUCOPHAGE) 500 MG tablet    Sig: Take 1 tablet (500 mg total) by mouth 2  (two) times daily with a meal.    Dispense:  180 tablet    Refill:  0

## 2014-08-04 NOTE — Patient Instructions (Signed)
Recheck the 1st week of november

## 2014-08-05 LAB — HCV RNA QUANT RFLX ULTRA OR GENOTYP
HCV QUANT: 71893 [IU]/mL — AB (ref <15)
HCV Quantitative Log: 4.86 {Log} — ABNORMAL HIGH (ref <1.18)

## 2014-08-10 LAB — HEPATITIS C GENOTYPE: HCV Genotype: 1

## 2014-10-20 ENCOUNTER — Encounter: Payer: Self-pay | Admitting: Family Medicine

## 2015-01-15 ENCOUNTER — Other Ambulatory Visit: Payer: Self-pay | Admitting: Internal Medicine

## 2015-04-05 ENCOUNTER — Ambulatory Visit (INDEPENDENT_AMBULATORY_CARE_PROVIDER_SITE_OTHER): Payer: 59 | Admitting: Family Medicine

## 2015-04-05 ENCOUNTER — Ambulatory Visit (INDEPENDENT_AMBULATORY_CARE_PROVIDER_SITE_OTHER): Payer: 59

## 2015-04-05 VITALS — BP 118/62 | HR 67 | Temp 98.0°F | Resp 16 | Ht 60.5 in | Wt 158.4 lb

## 2015-04-05 DIAGNOSIS — R079 Chest pain, unspecified: Secondary | ICD-10-CM | POA: Diagnosis not present

## 2015-04-05 DIAGNOSIS — E119 Type 2 diabetes mellitus without complications: Secondary | ICD-10-CM | POA: Diagnosis not present

## 2015-04-05 DIAGNOSIS — Z23 Encounter for immunization: Secondary | ICD-10-CM

## 2015-04-05 DIAGNOSIS — L282 Other prurigo: Secondary | ICD-10-CM

## 2015-04-05 DIAGNOSIS — K219 Gastro-esophageal reflux disease without esophagitis: Secondary | ICD-10-CM

## 2015-04-05 DIAGNOSIS — J370 Chronic laryngitis: Secondary | ICD-10-CM | POA: Diagnosis not present

## 2015-04-05 LAB — POCT GLYCOSYLATED HEMOGLOBIN (HGB A1C): HEMOGLOBIN A1C: 6.3

## 2015-04-05 LAB — GLUCOSE, POCT (MANUAL RESULT ENTRY): POC Glucose: 278 mg/dl — AB (ref 70–99)

## 2015-04-05 MED ORDER — METFORMIN HCL 500 MG PO TABS: 500.0000 mg | ORAL_TABLET | Freq: Two times a day (BID) | ORAL | Status: DC

## 2015-04-05 MED ORDER — LISINOPRIL 10 MG PO TABS: 10.0000 mg | ORAL_TABLET | Freq: Every day | ORAL | Status: DC

## 2015-04-05 MED ORDER — OMEPRAZOLE 20 MG PO CPDR: 20.0000 mg | DELAYED_RELEASE_CAPSULE | Freq: Two times a day (BID) | ORAL | Status: AC

## 2015-04-05 MED ORDER — PREDNISONE 10 MG (21) PO TBPK
10.0000 mg | ORAL_TABLET | Freq: Every day | ORAL | Status: DC
Start: 2015-04-05 — End: 2015-04-05

## 2015-04-05 NOTE — Patient Instructions (Addendum)
Please take your medication as prescribed.  I am placing you on omeprazole to see if it helps your hoarseness, while we await the referral to the ear nose and throat doctor.  Remember to drink water.   We need to check your diabetes again in 6 months.

## 2015-04-05 NOTE — Progress Notes (Signed)
[facility] [address] [place] [address] [phone]  Date:  04/05/2015   Name:  [name] [name]   DOB:  09-10-1956   MRN:  [id number]  PCP:  No PCP Per Patient    Chief Complaint: Sore Throat; Hoarse; Diabetes; and Medication Refill   History of Present Illness:  [name] [name] is a 59 y.o. very pleasant male patient who presents with the following: [place] speaking with moderate language barrier, no translator with hotline for this dialect...  Patient complains of 4-5 months of sore throat.  He states that he has some trouble breathing, and as if something is stuck down his throat.  He does have some heartburn but no regurge.  He denies any blood in the stool.  He has no He has never felt choking.  It feels very dry.  He drinks 2-3 bottles of water per day.   He also complains of chest pain.  This is not associated with any sob, diaphoresis, nasuea, numbness or tingling.  He states he feels pain on the inside, pointing to his sternum.  It is not a palpable pain.  There is a rash flare along his sternum that itches.   He has no hx of asthma, but he was an avid smoker for decades.  He quit in 1997. Patient is here also for diabetes recheck along with medication refill.  He has been a week without metformin.  He denies nausea, vomiting, diarrhea, tremulousness, or episodes of vision changes.  He does not check his blood sugar at home.  No dizziness.  1 week of red rash along chest.  Palpitations, feels like his water in lungs.  No asthma.  Water 2-3 water bottles.      Patient Active Problem List   Diagnosis Date Noted  . 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 Type II or unspecified type diabetes mellitus without mention of complication, not stated as uncontrolled--07/28/14 07/29/2014  . Absolute anemia 07/29/2014  . Hepatitis C antibody test positive 05/09/2012    Past Medical History  Diagnosis Date  . Hepatitis C   . Diabetes mellitus without complication   .  Hypertension     History reviewed. No pertinent past surgical history.  History  Substance Use Topics  . Smoking status: Former Smoker    Quit date: 05/09/1996  . Smokeless tobacco: Never Used  . Alcohol Use: Yes     Comment: social    History reviewed. No pertinent family history.  No Known Allergies  Medication list has been reviewed and updated.  Current Outpatient Prescriptions on File Prior to Visit  Medication Sig Dispense Refill  . lisinopril (PRINIVIL,ZESTRIL) 10 MG tablet Take 1 tablet (10 mg total) by mouth daily. 90 tablet 3  . metFORMIN (GLUCOPHAGE) 500 MG tablet Take 1 tablet (500 mg total) by mouth 2 (two) times daily with a meal. PATIENT NEEDS OFFICE VISIT FOR ADDITIONAL REFILLS 60 tablet 0  . aspirin-sod bicarb-citric acid (ALKA-SELTZER) 325 MG TBEF tablet Take 325 mg by mouth every 6 (six) hours as needed.    . ondansetron (ZOFRAN) 4 MG tablet Take 1 tablet (4 mg total) by mouth every 8 (eight) hours as needed for nausea or vomiting. (Patient not taking: Reported on 04/05/2015) 20 tablet 0  . polyethylene glycol powder (GLYCOLAX/MIRALAX) powder 1 scoop in 8 oz water bid for 3 days then qd for 7 days (Patient not taking: Reported on 04/05/2015) 255 g 0   No current facility-administered medications on file prior  to visit.    Review of Systems: ROS otherwise unremarkable unless listed above.  Physical Examination: Filed Vitals:   04/05/15 1806  BP: 118/62  Pulse: 67  Temp: 98 F (36.7 C)  Resp: 16   Filed Vitals:   04/05/15 1806  Height: 5' 0.5" (1.537 m)  Weight: 158 lb 6 oz (71.838 kg)   Body mass index is 30.41 kg/(m^2). Ideal Body Weight: Weight in (lb) to have BMI = 25: 129.9  Physical Exam  Constitutional: He appears well-developed and well-nourished.  HENT:  Head: Normocephalic and atraumatic.  Right Ear: External ear and ear canal normal. Tympanic membrane is injected. Tympanic membrane is not perforated and not erythematous.  Left Ear:  External ear and ear canal normal. Tympanic membrane is injected. Tympanic membrane is not perforated and not erythematous.  Nose: Nose normal. No mucosal edema or rhinorrhea. Right sinus exhibits no maxillary sinus tenderness and no frontal sinus tenderness. Left sinus exhibits no maxillary sinus tenderness and no frontal sinus tenderness.  Mouth/Throat: Oropharynx is clear and moist. Mucous membranes are not pale and not cyanotic. No uvula swelling. No oropharyngeal exudate, posterior oropharyngeal edema or posterior oropharyngeal erythema.  Eyes: Conjunctivae and EOM are normal. Pupils are equal, round, and reactive to light.  Neck: Trachea normal. No rigidity. Normal range of motion present. No thyroid mass and no thyromegaly present.  Abnormal phonation, with raspy but almost potato-like.    Cardiovascular: Normal rate and regular rhythm.  Exam reveals no gallop, no distant heart sounds and no friction rub.   No murmur heard. Pulses:      Carotid pulses are 2+ on the right side, and 2+ on the left side.      Radial pulses are 2+ on the right side, and 2+ on the left side.  Pulmonary/Chest: No apnea. No respiratory distress. He has no decreased breath sounds. He has no wheezes. He has no rhonchi.  No stridor or tripoding.  Breath sounds are normal.  Abdominal: Soft. Normal appearance.  Skin: He is not diaphoretic.  Upper sternum with mildly erythematous raised skin without vesicles or papules.    Psychiatric: He has a normal mood and affect. His behavior is normal. Thought content normal.    Prenebulizing treatment: 330  Predicted: 556  [facility] reading (PRIMARY) by  Dr. Milus GlazierLauenstein: No acute cardiopulmonary findings.  Results for orders placed or performed in visit on 04/05/15  POCT glucose (manual entry)  Result Value Ref Range   POC Glucose 278 (A) 70 - 99 mg/dl  POCT glycosylated hemoglobin (Hb A1C)  Result Value Ref Range   Hemoglobin A1C 6.3      Assessment and Plan: 59 year  old male is here today for chief complaint of sorethroat, hoarseness, and medication refill.   Blood sugar is high, but by the a1c this is likely due to his lack of medication continuity.  Refilling metformin.  Advised for diabetes recheck in 6 months. I am treating patient with a PPI for possible laryngeal reflux, though there is concern for stricture or malignancy.  At this time, ENT is appreciated with possible endoscopy.  He was a heavy smoker in the past, and endoscopy may be warranted at this time.  Type 2 diabetes mellitus without complication - Plan: POCT glucose (manual entry), POCT glycosylated hemoglobin (Hb A1C), COMPLETE METABOLIC PANEL WITH GFR, Lipid panel, Microalbumin, urine, metFORMIN (GLUCOPHAGE) 500 MG tablet, lisinopril (PRINIVIL,ZESTRIL) 10 MG tablet, Tdap vaccine greater than or equal to 7yo IM  Chest pain, unspecified  chest pain type - Plan: EKG 12-Lead, DG Chest 2 View  Chronic laryngitis - Plan: Ambulatory referral to ENT  Gastroesophageal reflux disease, esophagitis presence not specified - Plan: omeprazole (PRILOSEC) 20 MG capsule  Need for pneumococcal vaccination - Plan: Pneumococcal polysaccharide vaccine 23-valent greater than or equal to 2yo subcutaneous/IM  Need for Tdap vaccination - Plan: Tdap vaccine greater than or equal to 7yo IM  Trena Platt, PA-C [facility] 4/19/20168:07 AM

## 2015-04-06 LAB — LIPID PANEL
Cholesterol: 109 mg/dL (ref 0–200)
HDL: 31 mg/dL — ABNORMAL LOW (ref >=40)
LDL CALC: 57 mg/dL (ref 0–99)
Total CHOL/HDL Ratio: 3.5 Ratio
Triglycerides: 103 mg/dL (ref <150)
VLDL: 21 mg/dL (ref 0–40)

## 2015-04-06 LAB — COMPLETE METABOLIC PANEL WITH GFR
ALT: 53 U/L (ref 0–53)
AST: 68 U/L — ABNORMAL HIGH (ref 0–37)
Albumin: 3.5 g/dL (ref 3.5–5.2)
Alkaline Phosphatase: 75 U/L (ref 39–117)
BUN: 9 mg/dL (ref 6–23)
CALCIUM: 8.8 mg/dL (ref 8.4–10.5)
CHLORIDE: 101 meq/L (ref 96–112)
CO2: 26 meq/L (ref 19–32)
Creat: 0.83 mg/dL (ref 0.50–1.35)
GLUCOSE: 230 mg/dL — AB (ref 70–99)
Potassium: 4 mEq/L (ref 3.5–5.3)
SODIUM: 133 meq/L — AB (ref 135–145)
TOTAL PROTEIN: 8.3 g/dL (ref 6.0–8.3)
Total Bilirubin: 1.3 mg/dL — ABNORMAL HIGH (ref 0.2–1.2)

## 2015-04-06 LAB — MICROALBUMIN, URINE: Microalb, Ur: 8.5 mg/dL — ABNORMAL HIGH (ref <2.0)

## 2015-04-06 MED ORDER — TRIAMCINOLONE ACETONIDE 0.1 % EX CREA: 1.0000 "application " | TOPICAL_CREAM | Freq: Two times a day (BID) | CUTANEOUS | Status: AC

## 2015-04-15 ENCOUNTER — Encounter: Payer: Self-pay | Admitting: Family Medicine

## 2015-09-09 ENCOUNTER — Encounter (HOSPITAL_COMMUNITY): Payer: Self-pay | Admitting: Emergency Medicine

## 2015-09-09 ENCOUNTER — Inpatient Hospital Stay (HOSPITAL_COMMUNITY)
Admission: EM | Admit: 2015-09-09 | Discharge: 2015-09-12 | DRG: 394 | Disposition: A | Discharge status: Home / Self Care | Payer: 59 | Attending: Internal Medicine | Admitting: Internal Medicine

## 2015-09-09 DIAGNOSIS — E119 Type 2 diabetes mellitus without complications: Secondary | ICD-10-CM | POA: Diagnosis present

## 2015-09-09 DIAGNOSIS — Z79899 Other long term (current) drug therapy: Secondary | ICD-10-CM

## 2015-09-09 DIAGNOSIS — K6289 Other specified diseases of anus and rectum: Principal | ICD-10-CM | POA: Diagnosis present

## 2015-09-09 DIAGNOSIS — D638 Anemia in other chronic diseases classified elsewhere: Secondary | ICD-10-CM | POA: Diagnosis present

## 2015-09-09 DIAGNOSIS — K602 Anal fissure, unspecified: Secondary | ICD-10-CM | POA: Diagnosis present

## 2015-09-09 DIAGNOSIS — E871 Hypo-osmolality and hyponatremia: Secondary | ICD-10-CM | POA: Diagnosis present

## 2015-09-09 DIAGNOSIS — R339 Retention of urine, unspecified: Secondary | ICD-10-CM | POA: Insufficient documentation

## 2015-09-09 DIAGNOSIS — D696 Thrombocytopenia, unspecified: Secondary | ICD-10-CM | POA: Diagnosis present

## 2015-09-09 DIAGNOSIS — Z87891 Personal history of nicotine dependence: Secondary | ICD-10-CM

## 2015-09-09 DIAGNOSIS — R740 Nonspecific elevation of levels of transaminase and lactic acid dehydrogenase [LDH]: Secondary | ICD-10-CM | POA: Diagnosis present

## 2015-09-09 DIAGNOSIS — B1920 Unspecified viral hepatitis C without hepatic coma: Secondary | ICD-10-CM | POA: Diagnosis present

## 2015-09-09 DIAGNOSIS — K5900 Constipation, unspecified: Secondary | ICD-10-CM | POA: Diagnosis present

## 2015-09-09 DIAGNOSIS — E860 Dehydration: Secondary | ICD-10-CM | POA: Diagnosis present

## 2015-09-09 DIAGNOSIS — I10 Essential (primary) hypertension: Secondary | ICD-10-CM | POA: Diagnosis present

## 2015-09-09 DIAGNOSIS — R161 Splenomegaly, not elsewhere classified: Secondary | ICD-10-CM | POA: Diagnosis present

## 2015-09-09 DIAGNOSIS — D72829 Elevated white blood cell count, unspecified: Secondary | ICD-10-CM | POA: Diagnosis present

## 2015-09-09 MED ORDER — LORAZEPAM 2 MG/ML IJ SOLN
0.5000 mg | Freq: Once | INTRAMUSCULAR | Status: AC
  Administered 2015-09-10: 0.5 mg via INTRAVENOUS
  Filled 2015-09-09: qty 1

## 2015-09-09 MED ORDER — SODIUM CHLORIDE 0.9 % IV BOLUS (SEPSIS)
500.0000 mL | Freq: Once | INTRAVENOUS | Status: AC
  Administered 2015-09-10: 500 mL via INTRAVENOUS

## 2015-09-09 NOTE — ED Notes (Signed)
Per EMS, patient came from home complaining of abdominal pain.  His abdomen is distended.  Pt states he can't urinate or use restroom since 1 pm today.  Pt took 1 Dulocax without any relief.  Pt also complains of lower back pain.  Denies n/v or diarrhea.  Pt states his stool normal.    No IV or medication given route.  BP: 154/64 P:96 R: 20

## 2015-09-09 NOTE — ED Provider Notes (Signed)
CSN: [id number]   Arrival date & time 09/09/15 2256  History  This chart was scribed for Gerhard Munch, MD by Bethel Born, ED Scribe. This patient was seen in room WA03/WA03 and the patient's care was started at 11:24 PM.  Chief Complaint  Patient presents with  . Abdominal Pain    HPI The history is provided by the patient. No language interpreter was used.   [name] [name] is a 59 y.o. male with PMHx of DM, HTN, and hepatitis C who presents to the Emergency Department complaining of new urinary retention with onset 10 hours ago. Associated symptoms include severe lower abdominal pain, perineal pain, and rectal pain. He has not had a bowel movement today and notes very hard stool yesterday. Pt denies vomiting.   Past Medical History  Diagnosis Date  . Hepatitis C   . Diabetes mellitus without complication   . Hypertension     History reviewed. No pertinent past surgical history.  No family history on file.  Social History  Substance Use Topics  . Smoking status: Former Smoker    Quit date: 05/09/1996  . Smokeless tobacco: Never Used  . Alcohol Use: Yes     Comment: social     Review of Systems  Constitutional:       Per HPI, otherwise negative  HENT:       Per HPI, otherwise negative  Respiratory:       Per HPI, otherwise negative  Cardiovascular:       Per HPI, otherwise negative  Gastrointestinal: Negative for vomiting.  Endocrine:       Negative aside from HPI  Genitourinary:       Neg aside from HPI   Musculoskeletal:       Per HPI, otherwise negative  Skin: Negative.   Neurological: Negative for syncope.   Home Medications   Prior to Admission medications   Medication Sig Start Date End Date Taking? Authorizing Provider  aspirin-sod bicarb-citric acid (ALKA-SELTZER) 325 MG TBEF tablet Take 325 mg by mouth every 6 (six) hours as needed.   Yes Historical Provider, MD  bisacodyl (DULCOLAX) 5 MG EC tablet Take 5 mg by mouth daily as needed for moderate  constipation.   Yes Historical Provider, MD  lisinopril (PRINIVIL,ZESTRIL) 10 MG tablet Take 1 tablet (10 mg total) by mouth daily. 04/05/15  Yes Collie Siad English, PA  metFORMIN (GLUCOPHAGE) 500 MG tablet Take 1 tablet (500 mg total) by mouth 2 (two) times daily with a meal. 04/05/15  Yes Collie Siad English, PA  omeprazole (PRILOSEC) 20 MG capsule Take 1 capsule (20 mg total) by mouth 2 (two) times daily before a meal. 04/05/15  Yes Stephanie D English, PA  ondansetron (ZOFRAN) 4 MG tablet Take 1 tablet (4 mg total) by mouth every 8 (eight) hours as needed for nausea or vomiting. Patient not taking: Reported on 04/05/2015 07/28/14   Tonye Pearson, MD  polyethylene glycol powder [facility]) powder 1 scoop in 8 oz water bid for 3 days then qd for 7 days Patient not taking: Reported on 04/05/2015 07/28/14   Tonye Pearson, MD    Allergies  Review of patient's allergies indicates no known allergies.  Triage Vitals: BP 138/84 mmHg  Pulse 91  Temp(Src) 99.8 F (37.7 C) (Oral)  Resp 20  SpO2 98%  Physical Exam  Constitutional: He is oriented to person, place, and time. He appears well-developed. No distress.  HENT:  Head: Normocephalic and atraumatic.  Eyes: Conjunctivae and EOM are  normal.  Cardiovascular: Normal rate and regular rhythm.   Pulmonary/Chest: Effort normal. No stridor. No respiratory distress.  Abdominal: He exhibits no distension.  Genitourinary: Testes normal and penis normal. Uncircumcised.  Normal scrotum 1 external hemorrhoid No obvious bleeding or fissure  Musculoskeletal: He exhibits no edema.  Neurological: He is alert and oriented to person, place, and time.  Skin: Skin is warm and dry.  Psychiatric: He has a normal mood and affect.  Nursing note and vitals reviewed.   ED Course  Procedures COORDINATION OF CARE: 11:27 PM Discussed treatment plan which includes lab work, bladder scan, IVF and Ativan with pt at bedside and pt agreed to  plan.  Bladder scan demonstrates greater than 500 mL of urine. Patient Foley catheter placed, without complication. More than 500 mL of urine drained.   Labs Reviewed  COMPREHENSIVE METABOLIC PANEL - Abnormal; Notable for the following:    Sodium 131 (*)    Glucose, Bld 215 (*)    Total Protein 8.5 (*)    AST 116 (*)    ALT 89 (*)    Total Bilirubin 1.9 (*)    All other components within normal limits  LIPASE, BLOOD - Abnormal; Notable for the following:    Lipase 58 (*)    All other components within normal limits  CBC WITH DIFFERENTIAL/PLATELET - Abnormal; Notable for the following:    WBC 18.8 (*)    Hemoglobin 11.2 (*)    HCT 36.0 (*)    MCV 77.9 (*)    MCH 24.2 (*)    Platelets 116 (*)    Neutro Abs 16.4 (*)    Monocytes Absolute 1.3 (*)    All other components within normal limits  URINALYSIS, ROUTINE W REFLEX MICROSCOPIC (NOT AT [facility]) - Abnormal; Notable for the following:    Color, Urine AMBER (*)    Glucose, UA 250 (*)    Protein, ur 30 (*)    All other components within normal limits  CULTURE, BLOOD (ROUTINE X 2)  CULTURE, BLOOD (ROUTINE X 2)  ETHANOL  URINE MICROSCOPIC-ADD ON    Imaging Review Ct Abdomen Pelvis W Contrast  09/10/2015   CLINICAL DATA:  Severe lower abdominal pain, perineal pain, urinary retention for several hours, leukocytosis, elevated lipase, hepatitis-C, diabetes mellitus, hypertension, former smoker  EXAM: CT ABDOMEN AND PELVIS WITH CONTRAST  TECHNIQUE: Multidetector CT imaging of the abdomen and pelvis was performed using the standard protocol following bolus administration of intravenous contrast. Sagittal and coronal MPR images reconstructed from axial data set.  CONTRAST:  OMNIPAQUE IOHEXOL 300 MG/ML SOLN IV. Dilute oral contrast.  COMPARISON:  None  FINDINGS: Lung bases clear.  Splenomegaly, spleen measuring 12.4 x 5.0 x 15.4 cm.  No additional focal abnormalities of the liver, spleen, pancreas, kidneys, or adrenal glands.   Contracted gallbladder.  Normal appendix.  Rectal wall thickening.  Stomach and remaining bowel loops normal appearance.  Normal size celiac and periportal nodes.  No mass, adenopathy, free air or free fluid.  Foley catheter and air within urinary bladder.  Bladder wall minimally thickened though bladder is incompletely distended, potentially artifact.  Unremarkable ureters.  Osseous structures unremarkable.  IMPRESSION: Mild rectal wall thickening, raising question of proctitis, recommend correlation with proctoscopy.  Minimal splenomegaly.   Electronically Signed   By: Ulyses Southward M.D.   On: 09/10/2015 02:37    Update: Patient remains in substantial pain, s/p BO suppository.  5:11 AM Patient newly febrile.  Persistent tachycardia.    MDM  Patient presents with abdominal pain, decreased urine and stool production. On exam the patient was found to have notable lower abdominal pain, as well as urinary retention. Patient's CT scan demonstrated proctitis, and given his persistent rectal pain, decreased bowel movements, concern for early proctocolitis. Patient was tachycardic, febrile, and with his urinary retention, after initiation of prostaglandin antibodies, fluid resuscitation, he was admitted for further evaluation and management.    Gerhard Munch, MD 09/10/15 [phone]

## 2015-09-09 NOTE — ED Notes (Signed)
Bed: WA03 Expected date:  Expected time:  Means of arrival:  Comments: 59 yo M  Back  Pain

## 2015-09-10 ENCOUNTER — Encounter (HOSPITAL_COMMUNITY): Payer: Self-pay

## 2015-09-10 ENCOUNTER — Encounter (HOSPITAL_COMMUNITY)
Admission: EM | Disposition: A | Discharge status: Home / Self Care | Payer: Self-pay | Source: Home / Self Care | Attending: Internal Medicine

## 2015-09-10 ENCOUNTER — Emergency Department (HOSPITAL_COMMUNITY): Payer: 59

## 2015-09-10 DIAGNOSIS — I10 Essential (primary) hypertension: Secondary | ICD-10-CM | POA: Diagnosis present

## 2015-09-10 DIAGNOSIS — D72829 Elevated white blood cell count, unspecified: Secondary | ICD-10-CM | POA: Diagnosis present

## 2015-09-10 DIAGNOSIS — E871 Hypo-osmolality and hyponatremia: Secondary | ICD-10-CM | POA: Diagnosis present

## 2015-09-10 DIAGNOSIS — R339 Retention of urine, unspecified: Secondary | ICD-10-CM

## 2015-09-10 DIAGNOSIS — D638 Anemia in other chronic diseases classified elsewhere: Secondary | ICD-10-CM | POA: Diagnosis present

## 2015-09-10 DIAGNOSIS — Z87891 Personal history of nicotine dependence: Secondary | ICD-10-CM | POA: Diagnosis not present

## 2015-09-10 DIAGNOSIS — E119 Type 2 diabetes mellitus without complications: Secondary | ICD-10-CM | POA: Diagnosis present

## 2015-09-10 DIAGNOSIS — Z79899 Other long term (current) drug therapy: Secondary | ICD-10-CM | POA: Diagnosis not present

## 2015-09-10 DIAGNOSIS — K5900 Constipation, unspecified: Secondary | ICD-10-CM | POA: Diagnosis present

## 2015-09-10 DIAGNOSIS — K6289 Other specified diseases of anus and rectum: Secondary | ICD-10-CM | POA: Diagnosis present

## 2015-09-10 DIAGNOSIS — E860 Dehydration: Secondary | ICD-10-CM | POA: Diagnosis present

## 2015-09-10 DIAGNOSIS — B1920 Unspecified viral hepatitis C without hepatic coma: Secondary | ICD-10-CM | POA: Diagnosis present

## 2015-09-10 DIAGNOSIS — R161 Splenomegaly, not elsewhere classified: Secondary | ICD-10-CM | POA: Diagnosis present

## 2015-09-10 DIAGNOSIS — K602 Anal fissure, unspecified: Secondary | ICD-10-CM | POA: Diagnosis present

## 2015-09-10 DIAGNOSIS — D696 Thrombocytopenia, unspecified: Secondary | ICD-10-CM | POA: Diagnosis present

## 2015-09-10 DIAGNOSIS — R740 Nonspecific elevation of levels of transaminase and lactic acid dehydrogenase [LDH]: Secondary | ICD-10-CM | POA: Diagnosis present

## 2015-09-10 HISTORY — PX: FLEXIBLE SIGMOIDOSCOPY: SHX5431

## 2015-09-10 LAB — URINALYSIS, ROUTINE W REFLEX MICROSCOPIC
Bilirubin Urine: NEGATIVE
Glucose, UA: 250 mg/dL — AB
HGB URINE DIPSTICK: NEGATIVE
Ketones, ur: NEGATIVE mg/dL
LEUKOCYTES UA: NEGATIVE
Nitrite: NEGATIVE
Protein, ur: 30 mg/dL — AB
SPECIFIC GRAVITY, URINE: 1.02 (ref 1.005–1.030)
UROBILINOGEN UA: 1 mg/dL (ref 0.0–1.0)
pH: 6 (ref 5.0–8.0)

## 2015-09-10 LAB — CBC WITH DIFFERENTIAL/PLATELET
BASOS PCT: 0 %
Basophils Absolute: 0 10*3/uL (ref 0.0–0.1)
Eosinophils Absolute: 0 10*3/uL (ref 0.0–0.7)
Eosinophils Relative: 0 %
HEMATOCRIT: 36 % — AB (ref 39.0–52.0)
HEMOGLOBIN: 11.2 g/dL — AB (ref 13.0–17.0)
LYMPHS PCT: 6 %
Lymphs Abs: 1.1 10*3/uL (ref 0.7–4.0)
MCH: 24.2 pg — AB (ref 26.0–34.0)
MCHC: 31.1 g/dL (ref 30.0–36.0)
MCV: 77.9 fL — ABNORMAL LOW (ref 78.0–100.0)
MONOS PCT: 7 %
Monocytes Absolute: 1.3 10*3/uL — ABNORMAL HIGH (ref 0.1–1.0)
NEUTROS ABS: 16.4 10*3/uL — AB (ref 1.7–7.7)
Neutrophils Relative %: 87 %
Platelets: 116 10*3/uL — ABNORMAL LOW (ref 150–400)
RBC: 4.62 MIL/uL (ref 4.22–5.81)
RDW: 14.2 % (ref 11.5–15.5)
WBC: 18.8 10*3/uL — ABNORMAL HIGH (ref 4.0–10.5)

## 2015-09-10 LAB — COMPREHENSIVE METABOLIC PANEL
ALBUMIN: 3.6 g/dL (ref 3.5–5.0)
ALT: 89 U/L — AB (ref 17–63)
AST: 116 U/L — AB (ref 15–41)
Alkaline Phosphatase: 69 U/L (ref 38–126)
Anion gap: 7 (ref 5–15)
BUN: 13 mg/dL (ref 6–20)
CHLORIDE: 102 mmol/L (ref 101–111)
CO2: 22 mmol/L (ref 22–32)
CREATININE: 0.93 mg/dL (ref 0.61–1.24)
Calcium: 9 mg/dL (ref 8.9–10.3)
GFR calc Af Amer: >60 mL/min (ref >60)
GFR calc non Af Amer: >60 mL/min (ref >60)
GLUCOSE: 215 mg/dL — AB (ref 65–99)
Potassium: 3.7 mmol/L (ref 3.5–5.1)
SODIUM: 131 mmol/L — AB (ref 135–145)
Total Bilirubin: 1.9 mg/dL — ABNORMAL HIGH (ref 0.3–1.2)
Total Protein: 8.5 g/dL — ABNORMAL HIGH (ref 6.5–8.1)

## 2015-09-10 LAB — GLUCOSE, CAPILLARY
GLUCOSE-CAPILLARY: 249 mg/dL — AB (ref 65–99)
GLUCOSE-CAPILLARY: 206 mg/dL — AB (ref 65–99)
Glucose-Capillary: 209 mg/dL — ABNORMAL HIGH (ref 65–99)
Glucose-Capillary: 217 mg/dL — ABNORMAL HIGH (ref 65–99)

## 2015-09-10 LAB — MAGNESIUM: Magnesium: 1.4 mg/dL — ABNORMAL LOW (ref 1.7–2.4)

## 2015-09-10 LAB — HIV ANTIBODY (ROUTINE TESTING W REFLEX): HIV SCREEN 4TH GENERATION: NONREACTIVE

## 2015-09-10 LAB — LIPASE, BLOOD: Lipase: 58 U/L — ABNORMAL HIGH (ref 22–51)

## 2015-09-10 LAB — URINE MICROSCOPIC-ADD ON

## 2015-09-10 LAB — ETHANOL: Alcohol, Ethyl (B): <5 mg/dL (ref <5)

## 2015-09-10 SURGERY — SIGMOIDOSCOPY, FLEXIBLE
Anesthesia: Moderate Sedation

## 2015-09-10 MED ORDER — CIPROFLOXACIN IN D5W 400 MG/200ML IV SOLN
400.0000 mg | Freq: Once | INTRAVENOUS | Status: AC
  Administered 2015-09-10: 400 mg via INTRAVENOUS
  Filled 2015-09-10: qty 200

## 2015-09-10 MED ORDER — CIPROFLOXACIN IN D5W 400 MG/200ML IV SOLN
400.0000 mg | Freq: Two times a day (BID) | INTRAVENOUS | Status: DC
  Administered 2015-09-10 – 2015-09-12 (×4): 400 mg via INTRAVENOUS
  Filled 2015-09-10 (×5): qty 200

## 2015-09-10 MED ORDER — ENOXAPARIN SODIUM 40 MG/0.4ML ~~LOC~~ SOLN
40.0000 mg | SUBCUTANEOUS | Status: DC
  Administered 2015-09-10 – 2015-09-11 (×2): 40 mg via SUBCUTANEOUS
  Filled 2015-09-10 (×2): qty 0.4

## 2015-09-10 MED ORDER — ACETAMINOPHEN 500 MG PO TABS
1000.0000 mg | ORAL_TABLET | Freq: Once | ORAL | Status: AC
  Administered 2015-09-10: 1000 mg via ORAL
  Filled 2015-09-10: qty 2

## 2015-09-10 MED ORDER — METRONIDAZOLE IN NACL 5-0.79 MG/ML-% IV SOLN
500.0000 mg | Freq: Three times a day (TID) | INTRAVENOUS | Status: DC
  Administered 2015-09-10 – 2015-09-12 (×7): 500 mg via INTRAVENOUS
  Filled 2015-09-10 (×8): qty 100

## 2015-09-10 MED ORDER — POLYETHYLENE GLYCOL 3350 17 G PO PACK: 17.0000 g | PACK | Freq: Every day | ORAL | Status: DC | PRN

## 2015-09-10 MED ORDER — ONDANSETRON HCL 4 MG/2ML IJ SOLN: 4.0000 mg | Freq: Three times a day (TID) | INTRAMUSCULAR | Status: AC | PRN

## 2015-09-10 MED ORDER — SODIUM CHLORIDE 0.9 % IJ SOLN
3.0000 mL | Freq: Two times a day (BID) | INTRAMUSCULAR | Status: DC
  Administered 2015-09-11: 3 mL via INTRAVENOUS

## 2015-09-10 MED ORDER — LEVALBUTEROL HCL 0.63 MG/3ML IN NEBU: 0.6300 mg | INHALATION_SOLUTION | Freq: Four times a day (QID) | RESPIRATORY_TRACT | Status: DC | PRN

## 2015-09-10 MED ORDER — INFLUENZA VAC SPLIT QUAD 0.5 ML IM SUSY
0.5000 mL | PREFILLED_SYRINGE | INTRAMUSCULAR | Status: AC
  Administered 2015-09-11: 0.5 mL via INTRAMUSCULAR
  Filled 2015-09-10 (×2): qty 0.5

## 2015-09-10 MED ORDER — METRONIDAZOLE IN NACL 5-0.79 MG/ML-% IV SOLN
500.0000 mg | Freq: Once | INTRAVENOUS | Status: AC
  Administered 2015-09-10: 500 mg via INTRAVENOUS
  Filled 2015-09-10: qty 100

## 2015-09-10 MED ORDER — HYDROMORPHONE HCL 1 MG/ML IJ SOLN
0.5000 mg | INTRAMUSCULAR | Status: DC | PRN
  Administered 2015-09-10 – 2015-09-11 (×3): 0.5 mg via INTRAVENOUS
  Filled 2015-09-10 (×2): qty 1

## 2015-09-10 MED ORDER — IOHEXOL 300 MG/ML  SOLN
100.0000 mL | Freq: Once | INTRAMUSCULAR | Status: AC | PRN
  Administered 2015-09-10: 100 mL via INTRAVENOUS

## 2015-09-10 MED ORDER — SODIUM CHLORIDE 0.9 % IV SOLN
INTRAVENOUS | Status: DC
  Administered 2015-09-10 – 2015-09-11 (×3): via INTRAVENOUS

## 2015-09-10 MED ORDER — HYDROMORPHONE HCL 1 MG/ML IJ SOLN
1.0000 mg | INTRAMUSCULAR | Status: AC | PRN
Start: 2015-09-10 — End: 2015-09-11
  Filled 2015-09-10: qty 1

## 2015-09-10 MED ORDER — OXYCODONE HCL 5 MG PO TABS
5.0000 mg | ORAL_TABLET | Freq: Four times a day (QID) | ORAL | Status: DC | PRN
  Administered 2015-09-10 – 2015-09-12 (×5): 5 mg via ORAL
  Filled 2015-09-10 (×5): qty 1

## 2015-09-10 MED ORDER — INSULIN ASPART 100 UNIT/ML ~~LOC~~ SOLN
0.0000 [IU] | Freq: Three times a day (TID) | SUBCUTANEOUS | Status: DC
  Administered 2015-09-10 (×3): 3 [IU] via SUBCUTANEOUS
  Administered 2015-09-11: 5 [IU] via SUBCUTANEOUS
  Administered 2015-09-11 (×2): 3 [IU] via SUBCUTANEOUS
  Administered 2015-09-12: 7 [IU] via SUBCUTANEOUS
  Administered 2015-09-12: 3 [IU] via SUBCUTANEOUS
  Administered 2015-09-12: 5 [IU] via SUBCUTANEOUS

## 2015-09-10 MED ORDER — SODIUM CHLORIDE 0.9 % IV SOLN: INTRAVENOUS | Status: AC

## 2015-09-10 MED ORDER — BELLADONNA ALKALOIDS-OPIUM 16.2-60 MG RE SUPP
1.0000 | Freq: Once | RECTAL | Status: AC
  Administered 2015-09-10: 1 via RECTAL
  Filled 2015-09-10: qty 1

## 2015-09-10 MED ORDER — IOHEXOL 300 MG/ML  SOLN
25.0000 mL | Freq: Once | INTRAMUSCULAR | Status: AC | PRN
  Administered 2015-09-10: 25 mL via ORAL

## 2015-09-10 MED ORDER — MORPHINE SULFATE (PF) 4 MG/ML IV SOLN
4.0000 mg | Freq: Once | INTRAVENOUS | Status: AC
  Administered 2015-09-10: 4 mg via INTRAVENOUS
  Filled 2015-09-10: qty 1

## 2015-09-10 MED ORDER — NITROGLYCERIN 2 % TD OINT
0.5000 [in_us] | TOPICAL_OINTMENT | Freq: Three times a day (TID) | TRANSDERMAL | Status: DC
  Administered 2015-09-10 – 2015-09-12 (×7): 0.5 [in_us] via TOPICAL
  Filled 2015-09-10 (×2): qty 30

## 2015-09-10 MED ORDER — MAGNESIUM SULFATE 2 GM/50ML IV SOLN
2.0000 g | Freq: Once | INTRAVENOUS | Status: AC
  Administered 2015-09-10: 2 g via INTRAVENOUS
  Filled 2015-09-10: qty 50

## 2015-09-10 MED ORDER — SODIUM CHLORIDE 0.9 % IV SOLN: INTRAVENOUS | Status: DC

## 2015-09-10 NOTE — ED Notes (Signed)
MD notified of temp, orders received.

## 2015-09-10 NOTE — ED Notes (Signed)
Bladder scan = 591 ml

## 2015-09-10 NOTE — H&P (Addendum)
[facility] History and Physical  Nissim Lauri ION:[id number] DOB: 1956-09-06 DOA: 09/09/2015  Referring physician:   PCP: No PCP Per Patient   Chief Complaint: Rectal pain HPI:  59 year old male with a history of diabetes type 2, hypertension, hepatitis C who presents to the emergency room because of rectal pain, urinary retention, difficulty defecating. Symptoms have been ongoing for 1-2 days. No prior history of having a colonoscopy according to the patient. No nausea vomiting or fever. No hematochezia or melena. CT scan shows proctocolitis, white count is 18,000. Denies any family history of inflammatory bowel disease    Review of Systems: negative for the following  Constitutional: Denies fever, chills, diaphoresis, appetite change and fatigue.  HEENT: Denies photophobia, eye pain, redness, hearing loss, ear pain, congestion, sore throat, rhinorrhea, sneezing, mouth sores, trouble swallowing, neck pain, neck stiffness and tinnitus.  Respiratory: Denies SOB, DOE, cough, chest tightness, and wheezing.  Cardiovascular: Denies chest pain, palpitations and leg swelling.  Gastrointestinal: Per history of present illness  Genitourinary: Denies dysuria, urgency, frequency, hematuria, flank pain and difficulty urinating.  Musculoskeletal: Denies myalgias, back pain, joint swelling, arthralgias and gait problem.  Skin: Denies pallor, rash and wound.  Neurological: Denies dizziness, seizures, syncope, weakness, light-headedness, numbness and headaches.  Hematological: Denies adenopathy. Easy bruising, personal or family bleeding history  Psychiatric/Behavioral: Denies suicidal ideation, mood changes, confusion, nervousness, sleep disturbance and agitation       Past Medical History  Diagnosis Date  . Hepatitis C   . Diabetes mellitus without complication   . Hypertension      History reviewed. No pertinent past surgical history.    Social History:  reports that he quit smoking  about 19 years ago. He has never used smokeless tobacco. He reports that he drinks alcohol. He reports that he does not use illicit drugs.    No Known Allergies      FAMILY HISTORY  When questioned  Directly-patient reports  No family history of HTN, CVA ,DIABETES, TB, Cancer CAD, Bleeding Disorders, Sickle Cell, diabetes, anemia, asthma,   Prior to Admission medications   Medication Sig Start Date End Date Taking? Authorizing Provider  aspirin-sod bicarb-citric acid (ALKA-SELTZER) 325 MG TBEF tablet Take 325 mg by mouth every 6 (six) hours as needed.   Yes Historical Provider, MD  bisacodyl (DULCOLAX) 5 MG EC tablet Take 5 mg by mouth daily as needed for moderate constipation.   Yes Historical Provider, MD  lisinopril (PRINIVIL,ZESTRIL) 10 MG tablet Take 1 tablet (10 mg total) by mouth daily. 04/05/15  Yes Collie Siad English, PA  metFORMIN (GLUCOPHAGE) 500 MG tablet Take 1 tablet (500 mg total) by mouth 2 (two) times daily with a meal. 04/05/15  Yes Collie Siad English, PA  omeprazole (PRILOSEC) 20 MG capsule Take 1 capsule (20 mg total) by mouth 2 (two) times daily before a meal. 04/05/15  Yes Stephanie D English, PA  ondansetron (ZOFRAN) 4 MG tablet Take 1 tablet (4 mg total) by mouth every 8 (eight) hours as needed for nausea or vomiting. Patient not taking: Reported on 04/05/2015 07/28/14   Tonye Pearson, MD  polyethylene glycol powder [facility]) powder 1 scoop in 8 oz water bid for 3 days then qd for 7 days Patient not taking: Reported on 04/05/2015 07/28/14   Tonye Pearson, MD     Physical Exam: Filed Vitals:   09/10/15 0134 09/10/15 0230 09/10/15 0502 09/10/15 0621  BP: 132/77 132/72 117/76 131/72  Pulse: 92 96 97 91  Temp:  102.4 F (39.1 C) 99.7 F (37.6 C)  TempSrc:   Oral Oral  Resp: Height:     (1.626 m)  Weight:    69.809 kg (153 lb 14.4 oz)  SpO2: 100% 99% 97% 98%     Constitutional: Vital signs reviewed. Patient is a well-developed  and well-nourished in no acute distress and cooperative with exam. Alert and oriented x3.  Head: Normocephalic and atraumatic  Ear: TM normal bilaterally  Mouth: no erythema or exudates, MMM  Eyes: PERRL, EOMI, conjunctivae normal, No scleral icterus.  Neck: Supple, Trachea midline normal ROM, No JVD, mass, thyromegaly, or carotid bruit present.  Cardiovascular: RRR, S1 normal, S2 normal, no MRG, pulses symmetric and intact bilaterally  Pulmonary/Chest: CTAB, no wheezes, rales, or rhonchi  Abdominal: Normal scrotum, suprapubic discomfort  1 external hemorrhoid No obvious bleeding or fissure GU: no CVA tenderness Musculoskeletal: No joint deformities, erythema, or stiffness, ROM full and no nontender Ext: no edema and no cyanosis, pulses palpable bilaterally (DP and PT)  Hematology: no cervical, inginal, or axillary adenopathy.  Neurological: A&O x3, Strenght is normal and symmetric bilaterally, cranial nerve II-XII are grossly intact, no focal motor deficit, sensory intact to light touch bilaterally.  Skin: Warm, dry and intact. No rash, cyanosis, or clubbing.  Psychiatric: Normal mood and affect. speech and behavior is normal. Judgment and thought content normal. Cognition and memory are normal.      Data Review   Micro Results No results found for this or any previous visit (from the past 240 hour(s)).  Radiology Reports Ct Abdomen Pelvis W Contrast  09/10/2015   CLINICAL DATA:  Severe lower abdominal pain, perineal pain, urinary retention for several hours, leukocytosis, elevated lipase, hepatitis-C, diabetes mellitus, hypertension, former smoker  EXAM: CT ABDOMEN AND PELVIS WITH CONTRAST  TECHNIQUE: Multidetector CT imaging of the abdomen and pelvis was performed using the standard protocol following bolus administration of intravenous contrast. Sagittal and coronal MPR images reconstructed from axial data set.  CONTRAST:  OMNIPAQUE IOHEXOL 300 MG/ML SOLN IV. Dilute oral  contrast.  COMPARISON:  None  FINDINGS: Lung bases clear.  Splenomegaly, spleen measuring 12.4 x 5.0 x 15.4 cm.  No additional focal abnormalities of the liver, spleen, pancreas, kidneys, or adrenal glands.  Contracted gallbladder.  Normal appendix.  Rectal wall thickening.  Stomach and remaining bowel loops normal appearance.  Normal size celiac and periportal nodes.  No mass, adenopathy, free air or free fluid.  Foley catheter and air within urinary bladder.  Bladder wall minimally thickened though bladder is incompletely distended, potentially artifact.  Unremarkable ureters.  Osseous structures unremarkable.  IMPRESSION: Mild rectal wall thickening, raising question of proctitis, recommend correlation with proctoscopy.  Minimal splenomegaly.   Electronically Signed   By: Ulyses Southward M.D.   On: 09/10/2015 02:37     CBC  Recent Labs Lab 09/10/15 0005  WBC 18.8*  HGB 11.2*  HCT 36.0*  PLT 116*  MCV 77.9*  MCH 24.2*  MCHC 31.1  RDW 14.2  LYMPHSABS 1.1  MONOABS 1.3*  EOSABS 0.0  BASOSABS 0.0    Chemistries   Recent Labs Lab 09/10/15 0005 09/10/15 0910  NA 131*  --   K 3.7  --   CL 102  --   CO2 22  --   GLUCOSE 215*  --   BUN 13  --   CREATININE 0.93  --   CALCIUM 9.0  --   MG  --  1.4*  AST 116*  --   ALT 89*  --   ALKPHOS 69  --   BILITOT 1.9*  --    ------------------------------------------------------------------------------------------------------------------ estimated creatinine clearance is 73.4 mL/min (by C-G formula based on Cr of 0.93). ------------------------------------------------------------------------------------------------------------------ No results for input(s): HGBA1C in the last 72 hours. ------------------------------------------------------------------------------------------------------------------ No results for input(s): CHOL, HDL, LDLCALC, TRIG, CHOLHDL, LDLDIRECT in the last 72  hours. ------------------------------------------------------------------------------------------------------------------ No results for input(s): TSH, T4TOTAL, T3FREE, THYROIDAB in the last 72 hours.  Invalid input(s): FREET3 ------------------------------------------------------------------------------------------------------------------ No results for input(s): VITAMINB12, FOLATE, FERRITIN, TIBC, IRON, RETICCTPCT in the last 72 hours.  Coagulation profile No results for input(s): INR, PROTIME in the last 168 hours.  No results for input(s): DDIMER in the last 72 hours.  Cardiac Enzymes No results for input(s): CKMB, TROPONINI, MYOGLOBIN in the last 168 hours.  Invalid input(s): CK ------------------------------------------------------------------------------------------------------------------ Invalid input(s): POCBNP   CBG:  Recent Labs Lab 09/10/15 0809  GLUCAP 249*       EKG: Independently reviewed.     Assessment/Plan Proctitis-white count elevated, therefore started on antibiotics, patient started on stool softeners, clear liquid diet, Dr. Jeani Hawking consulted for possible flexible sigmoidoscopy. UA negative  Hyponatremia likely secondary dehydration  Diabetes type 2, patient started on sliding scale insulin for Accu-Cheks    Anemia of chronic disease hemoglobin 11.2 MCV 77.9, platelets 116, mild splenomegaly and history of hepatitis C, possibly also has underlying cirrhosis    Hypomagnesemia-replete  Transaminitis likely secondary to history of hepatitis C      Code Status:   full Family Communication: bedside Disposition Plan: admit   Total time spent 55 minutes.Greater than 50% of this time was spent in counseling, explanation of diagnosis, planning of further management, and coordination of care  [facility] Pager [phone]  If 7PM-7AM, please contact night-coverage [email or web address] Password [facility] 09/10/2015, 11:43  AM

## 2015-09-10 NOTE — Op Note (Signed)
[facility] [address] [place], [address]   FLEXIBLE SIGMOIDOSCOPY PROCEDURE REPORT  PATIENT: [name] [name], [name] [name]  MR#: [id number] BIRTHDATE: 1956/08/01 , 59  yrs. old GENDER: male ENDOSCOPIST: Jeani Hawking, MD REFERRED BY: PROCEDURE DATE:  09/10/2015 PROCEDURE:   Sigmoidoscopy with biopsy ASA CLASS:   Class III INDICATIONS: Proctitis MEDICATIONS: None  DESCRIPTION OF PROCEDURE:   After the risks benefits and alternatives of the procedure were thoroughly explained, informed consent was obtained.  Digital exam revealed an anal fissure. The endoscope was introduced through the anus  and advanced to the descending colon , The exam was Without limitations.    The quality of the prep was The overall prep quality was excellent. . Estimated blood loss is zero unless otherwise noted in this procedure report. The instrument was then slowly withdrawn as the mucosa was fully examined.       FINDINGS: In the distal rectum, just above the dentate line there was evidence of some inflammation and swelling.  No other abnormalities proximal to this point.  Cold biopsies were obtained. Retroflexed views as described.    The scope was then withdrawn from the patient and the procedure terminated.  COMPLICATIONS: There were no immediate complications.  ENDOSCOPIC IMPRESSION: 1) Proctitis s/p biopsy. 2) Posterior anal fissure.  RECOMMENDATIONS: 1) continue with antibiotics. 2) Start NTG ointment.  This may cause headaches as the pharmacy does not have the 0.125% formulation.  REPEAT EXAM:  eSigned:  Jeani Hawking, MD 09/10/2015 2:02 PM   CC:

## 2015-09-10 NOTE — Consult Note (Signed)
Reason for Consult: Proctitis and HCV Referring Physician: [facility]  Toby Ashford HPI: This is a 59 year old male with a PMH of HCV genotype 1, DM, and HTN admitted for complaints of proctalgia.  He has been suffering from the symptoms for the past 1-2 days.  Associated with the proctalgia is urinary retention and tenesmus.  A rectal examination upon admission was negative for any evidence of a fissure.  The CT scan reveals mild rectal wall thickening and his WBC was at 18,000.  Intermittently he has issues with constipation and he thinks this may have set off his issues.   No problems with diarrhea or fever.  Additionally, he was noted to have splenomegaly.  The patient denies having a prior colonoscopy.  As for his HCV, he was diagnosed in 08/02/2015 as he has a history of elevated liver enzymes. No comments about his liver, but he is thrombocytopenic.    Past Medical History  Diagnosis Date  . Hepatitis C   . Diabetes mellitus without complication   . Hypertension     History reviewed. No pertinent past surgical history.  No family history on file.  Social History:  reports that he quit smoking about 19 years ago. He has never used smokeless tobacco. He reports that he drinks alcohol. He reports that he does not use illicit drugs.  Allergies: No Known Allergies  Medications:  Scheduled: . ciprofloxacin  400 mg Intravenous Q12H  . enoxaparin (LOVENOX) injection  40 mg Subcutaneous Q24H  . [START ON 09/11/2015] Influenza vac split quadrivalent PF  0.5 mL Intramuscular Tomorrow-1000  . insulin aspart  0-9 Units Subcutaneous TID WC  . magnesium sulfate 1 - 4 g bolus IVPB  2 g Intravenous Once  . metronidazole  500 mg Intravenous Q8H  . sodium chloride  3 mL Intravenous Q12H   Continuous: . sodium chloride 100 mL/hr at 09/10/15 0754    Results for orders placed or performed during the hospital encounter of 09/09/15 (from the past 24 hour(s))  Comprehensive metabolic panel      Status: Abnormal   Collection Time: 09/10/15 12:05 AM  Result Value Ref Range   Sodium 131 (L) 135 - 145 mmol/L   Potassium 3.7 3.5 - 5.1 mmol/L   Chloride 102 101 - 111 mmol/L   CO2 22 22 - 32 mmol/L   Glucose, Bld 215 (H) 65 - 99 mg/dL   BUN 13 6 - 20 mg/dL   Creatinine, Ser 1.61 0.61 - 1.24 mg/dL   Calcium 9.0 8.9 - 09.6 mg/dL   Total Protein 8.5 (H) 6.5 - 8.1 g/dL   Albumin 3.6 3.5 - 5.0 g/dL   AST 045 (H) 15 - 41 U/L   ALT 89 (H) 17 - 63 U/L   Alkaline Phosphatase 69 38 - 126 U/L   Total Bilirubin 1.9 (H) 0.3 - 1.2 mg/dL   GFR calc non Af Amer >60 >60 mL/min   GFR calc Af Amer >60 >60 mL/min   Anion gap 7 5 - 15  Ethanol     Status: None   Collection Time: 09/10/15 12:05 AM  Result Value Ref Range   Alcohol, Ethyl (B) <5 <5 mg/dL  Lipase, blood     Status: Abnormal   Collection Time: 09/10/15 12:05 AM  Result Value Ref Range   Lipase 58 (H) 22 - 51 U/L  CBC with Differential     Status: Abnormal   Collection Time: 09/10/15 12:05 AM  Result Value Ref Range  WBC 18.8 (H) 4.0 - 10.5 K/uL   RBC 4.62 4.22 - 5.81 MIL/uL   Hemoglobin 11.2 (L) 13.0 - 17.0 g/dL   HCT 40.9 (L) 81.1 - 91.4 %   MCV 77.9 (L) 78.0 - 100.0 fL   MCH 24.2 (L) 26.0 - 34.0 pg   MCHC 31.1 30.0 - 36.0 g/dL   RDW 78.2 95.6 - 21.3 %   Platelets 116 (L) 150 - 400 K/uL   Neutrophils Relative % 87 %   Lymphocytes Relative 6 %   Monocytes Relative 7 %   Eosinophils Relative 0 %   Basophils Relative 0 %   Neutro Abs 16.4 (H) 1.7 - 7.7 K/uL   Lymphs Abs 1.1 0.7 - 4.0 K/uL   Monocytes Absolute 1.3 (H) 0.1 - 1.0 K/uL   Eosinophils Absolute 0.0 0.0 - 0.7 K/uL   Basophils Absolute 0.0 0.0 - 0.1 K/uL   RBC Morphology TARGET CELLS   Urinalysis, Routine w reflex microscopic     Status: Abnormal   Collection Time: 09/10/15 12:25 AM  Result Value Ref Range   Color, Urine AMBER (A) YELLOW   APPearance CLEAR CLEAR   Specific Gravity, Urine 1.020 1.005 - 1.030   pH 6.0 5.0 - 8.0   Glucose, UA 250 (A) NEGATIVE  mg/dL   Hgb urine dipstick NEGATIVE NEGATIVE   Bilirubin Urine NEGATIVE NEGATIVE   Ketones, ur NEGATIVE NEGATIVE mg/dL   Protein, ur 30 (A) NEGATIVE mg/dL   Urobilinogen, UA 1.0 0.0 - 1.0 mg/dL   Nitrite NEGATIVE NEGATIVE   Leukocytes, UA NEGATIVE NEGATIVE  Urine microscopic-add on     Status: None   Collection Time: 09/10/15 12:25 AM  Result Value Ref Range   WBC, UA 0-2 <3 WBC/hpf   Bacteria, UA RARE RARE  Glucose, capillary     Status: Abnormal   Collection Time: 09/10/15  8:09 AM  Result Value Ref Range   Glucose-Capillary 249 (H) 65 - 99 mg/dL  Magnesium     Status: Abnormal   Collection Time: 09/10/15  9:10 AM  Result Value Ref Range   Magnesium 1.4 (L) 1.7 - 2.4 mg/dL  Glucose, capillary     Status: Abnormal   Collection Time: 09/10/15 12:12 PM  Result Value Ref Range   Glucose-Capillary 206 (H) 65 - 99 mg/dL     Ct Abdomen Pelvis W Contrast  09/10/2015   CLINICAL DATA:  Severe lower abdominal pain, perineal pain, urinary retention for several hours, leukocytosis, elevated lipase, hepatitis-C, diabetes mellitus, hypertension, former smoker  EXAM: CT ABDOMEN AND PELVIS WITH CONTRAST  TECHNIQUE: Multidetector CT imaging of the abdomen and pelvis was performed using the standard protocol following bolus administration of intravenous contrast. Sagittal and coronal MPR images reconstructed from axial data set.  CONTRAST:  OMNIPAQUE IOHEXOL 300 MG/ML SOLN IV. Dilute oral contrast.  COMPARISON:  None  FINDINGS: Lung bases clear.  Splenomegaly, spleen measuring 12.4 x 5.0 x 15.4 cm.  No additional focal abnormalities of the liver, spleen, pancreas, kidneys, or adrenal glands.  Contracted gallbladder.  Normal appendix.  Rectal wall thickening.  Stomach and remaining bowel loops normal appearance.  Normal size celiac and periportal nodes.  No mass, adenopathy, free air or free fluid.  Foley catheter and air within urinary bladder.  Bladder wall minimally thickened though bladder is  incompletely distended, potentially artifact.  Unremarkable ureters.  Osseous structures unremarkable.  IMPRESSION: Mild rectal wall thickening, raising question of proctitis, recommend correlation with proctoscopy.  Minimal splenomegaly.   Electronically  Signed   By: Ulyses Southward M.D.   On: 09/10/2015 02:37    ROS:  As stated above in the HPI otherwise negative.  Blood pressure 131/72, pulse 91, temperature 99.7 F (37.6 C), temperature source Oral, resp. rate 20, height  (1.626 m), weight 69.809 kg (153 lb 14.4 oz), SpO2 98 %.    PE: Gen: NAD, Alert and Oriented HEENT:  /AT, EOMI Neck: Supple, no LAD Lungs: CTA Bilaterally CV: RRR without M/G/R ABM: Soft, NTND, +BS Ext: No C/C/E Rectal: Posterior anal fissure.  Erosion of the gluteal cleft.  Assessment/Plan: 1) Posterior anal fissure. 2) ? Proctitis. 3) Leukocytosis. 4) HCV genotype 1   It is clear to me that he has a posterior anal fissure and this is the source of his urinary retention and tenesmus, however, I cannot explain the markedly elevated WBC.  He denies any fever.  I will perform a FFS to evaluate the rectal mucosa today. As for his HCV, I suspect he has cirrhosis.  I asked him to stop drinking ETOH.  As an outpatient he will need elastography to see if he can qualify for treatment of his HCV.  Plan: 1) FFS now. 2) Agree with antibiotics for now.  HUNG,PATRICK D 09/10/2015, 12:25 PM

## 2015-09-10 NOTE — ED Notes (Signed)
Patient reports BM, reports some relief.

## 2015-09-11 LAB — CBC
HCT: 32.7 % — ABNORMAL LOW (ref 39.0–52.0)
Hemoglobin: 10.1 g/dL — ABNORMAL LOW (ref 13.0–17.0)
MCH: 24.3 pg — ABNORMAL LOW (ref 26.0–34.0)
MCHC: 30.9 g/dL (ref 30.0–36.0)
MCV: 78.8 fL (ref 78.0–100.0)
Platelets: 102 10*3/uL — ABNORMAL LOW (ref 150–400)
RBC: 4.15 MIL/uL — ABNORMAL LOW (ref 4.22–5.81)
RDW: 14.5 % (ref 11.5–15.5)
WBC: 10.7 10*3/uL — ABNORMAL HIGH (ref 4.0–10.5)

## 2015-09-11 LAB — GLUCOSE, CAPILLARY
GLUCOSE-CAPILLARY: 234 mg/dL — AB (ref 65–99)
Glucose-Capillary: 226 mg/dL — ABNORMAL HIGH (ref 65–99)
Glucose-Capillary: 267 mg/dL — ABNORMAL HIGH (ref 65–99)
Glucose-Capillary: 279 mg/dL — ABNORMAL HIGH (ref 65–99)

## 2015-09-11 LAB — COMPREHENSIVE METABOLIC PANEL
ALBUMIN: 3.1 g/dL — AB (ref 3.5–5.0)
ALK PHOS: 48 U/L (ref 38–126)
ALT: 60 U/L (ref 17–63)
AST: 69 U/L — ABNORMAL HIGH (ref 15–41)
Anion gap: 8 (ref 5–15)
BILIRUBIN TOTAL: 3.1 mg/dL — AB (ref 0.3–1.2)
BUN: 11 mg/dL (ref 6–20)
CO2: 23 mmol/L (ref 22–32)
Calcium: 8.1 mg/dL — ABNORMAL LOW (ref 8.9–10.3)
Chloride: 104 mmol/L (ref 101–111)
Creatinine, Ser: 0.82 mg/dL (ref 0.61–1.24)
GFR calc Af Amer: >60 mL/min (ref >60)
GFR calc non Af Amer: >60 mL/min (ref >60)
GLUCOSE: 165 mg/dL — AB (ref 65–99)
POTASSIUM: 3.7 mmol/L (ref 3.5–5.1)
Sodium: 135 mmol/L (ref 135–145)
TOTAL PROTEIN: 7.9 g/dL (ref 6.5–8.1)

## 2015-09-11 LAB — MAGNESIUM: MAGNESIUM: 1.8 mg/dL (ref 1.7–2.4)

## 2015-09-11 NOTE — Progress Notes (Signed)
Cross cover for Dr. Elnoria Howard Subjective: Patient complains of having ongoing rectal pain. Denies having any nausea, vomiting or abdominal pain. No BM today.   Objective: Vital signs in last 24 hours: Temp:  [98.4 F (36.9 C)-102.2 F (39 C)] 98.9 F (37.2 C) (09/24 1453) Pulse Rate:  [74-82] 78 (09/24 1453) Resp:  [18-19] 19 (09/24 1453) BP: (118-142)/(66-74) 142/74 mmHg (09/24 1453) SpO2:  [99 %] 99 % (09/24 1453) Last BM Date: 09/09/15  Intake/Output from previous day: 09/23 0701 - 09/24 0700 In: 1200 [I.V.:1200] Out: 1475 [Urine:1475] Intake/Output this shift: Total I/O In: 240 [P.O.:240] Out: 1400 [Urine:1400]  General appearance: alert, cooperative, appears stated age and no distress Resp: clear to auscultation bilaterally Cardio: regular rate and rhythm, S1, S2 normal, no murmur, click, rub or gallop GI: soft, non-tender; bowel sounds normal; no masses,  no organomegaly  Lab Results:  Recent Labs  09/10/15 0005 09/11/15 0534  WBC 18.8* 10.7*  HGB 11.2* 10.1*  HCT 36.0* 32.7*  PLT 116* 102*   BMET  Recent Labs  09/10/15 0005 09/11/15 0534  NA 131* 135  K 3.7 3.7  CL 102 104  CO2 22 23  GLUCOSE 215* 165*  BUN 13 11  CREATININE 0.93 0.82  CALCIUM 9.0 8.1*   LFT  Recent Labs  09/11/15 0534  PROT 7.9  ALBUMIN 3.1*  AST 69*  ALT 60  ALKPHOS 48  BILITOT 3.1*   Studies/Results: Ct Abdomen Pelvis W Contrast  09/10/2015   CLINICAL DATA:  Severe lower abdominal pain, perineal pain, urinary retention for several hours, leukocytosis, elevated lipase, hepatitis-C, diabetes mellitus, hypertension, former smoker  EXAM: CT ABDOMEN AND PELVIS WITH CONTRAST  TECHNIQUE: Multidetector CT imaging of the abdomen and pelvis was performed using the standard protocol following bolus administration of intravenous contrast. Sagittal and coronal MPR images reconstructed from axial data set.  CONTRAST:  OMNIPAQUE IOHEXOL 300 MG/ML SOLN IV. Dilute oral contrast.   COMPARISON:  None  FINDINGS: Lung bases clear.  Splenomegaly, spleen measuring 12.4 x 5.0 x 15.4 cm.  No additional focal abnormalities of the liver, spleen, pancreas, kidneys, or adrenal glands.  Contracted gallbladder.  Normal appendix.  Rectal wall thickening.  Stomach and remaining bowel loops normal appearance.  Normal size celiac and periportal nodes.  No mass, adenopathy, free air or free fluid.  Foley catheter and air within urinary bladder.  Bladder wall minimally thickened though bladder is incompletely distended, potentially artifact.  Unremarkable ureters.  Osseous structures unremarkable.  IMPRESSION: Mild rectal wall thickening, raising question of proctitis, recommend correlation with proctoscopy.  Minimal splenomegaly.   Electronically Signed   By: Ulyses Southward M.D.   On: 09/10/2015 02:37   Medications: I have reviewed the patient's current medications.  Assessment/Plan: 1) Rectal pain secondary to an anal fissure/history of constipation: continue NTG and a scheduled bowel regime for the patient with Colace 100 mg 2 PO BID along with liberal fluid intake. He should see Dr. Elnoria Howard as an OP basis. 2) Hepatitis C with splenomegaly. 3) Proctitis on flex sig: pathology results pending. . LOS: 1 day   MANN,JYOTHI 09/11/2015, 4:05 PM

## 2015-09-11 NOTE — Progress Notes (Signed)
[facility] PROGRESS NOTE  [name] [name] EAV:[id number] DOB: [birth year] DOA: 09/09/2015 PCP: No PCP Per Patient Interim summary/ Hospital course.  59 year old male with a history of diabetes type 2, hypertension, hepatitis C who presents to the emergency room because of rectal pain, urinary retention, difficulty defecating. CT showed Mild rectal wall thickening, raising question of proctitis, recommend correlation with proctoscopy. GI consutled , underwent flex sig on 9/23 showing proctitis and posterior anal fissure.  On IV antibiotics, still some nausea and vomiting. But symptoms are improving.  Plan for D/C in am.   Hepatitis C: Outpatient follow up for hep c treatment.   Diabetes Mellitus: CBG (last 3)   Recent Labs  09/10/15 1647 09/10/15 2128 09/11/15 0756  GLUCAP 209* 217* 226*   hgba1c is pending.  On SSI.  Metformin on hold for a few days in view of the contrast use.  Change regular to carb modified diet.    Hypertension: well controlled on lisinopril at home resume the same.   Hypomagnesemia: repleted repeat in am.     Code Status: full code.  Family Communication: multiple family members atbedside Disposition Plan: d/c in am, if no nausea or vomiting.    Consultants:  Gastroenterology   Procedures:  Flex sig on 9/23  Antibiotics: Flagyl.  HPI/Subjective: Some nausea and vomiting earlier today,  Did not eat breakfast, is trying to eat lunch  Objective: Filed Vitals:   09/11/15 0514  BP: 118/66  Pulse: 74  Temp: 98.4 F (36.9 C)  Resp: 18    Intake/Output Summary (Last 24 hours) at 09/11/15 1203 Last data filed at 09/11/15 0600  Gross per 24 hour  Intake   1200 ml  Output   1475 ml  Net   -275 ml   Filed Weights   09/10/15 7829  Weight: 69.809 kg (153 lb 14.4 oz)    Exam:   General:  Alert cheerful, reports some nausea  Cardiovascular: s1s2  Respiratory: ctab,n o wheezing or rhonchi  Abdomen: soft non tender non distended  bowel sounds heard  Musculoskeletal: no pedal edema, cyanosis.   Data Reviewed: Basic Metabolic Panel:  Recent Labs Lab 09/10/15 0005 09/10/15 0910 09/11/15 0534  NA 131*  --  135  K 3.7  --  3.7  CL 102  --  104  CO2 22  --  23  GLUCOSE 215*  --  165*  BUN 13  --  11  CREATININE 0.93  --  0.82  CALCIUM 9.0  --  8.1*  MG  --  1.4*  --    Liver Function Tests:  Recent Labs Lab 09/10/15 0005 09/11/15 0534  AST 116* 69*  ALT 89* 60  ALKPHOS 69 48  BILITOT 1.9* 3.1*  PROT 8.5* 7.9  ALBUMIN 3.6 3.1*    Recent Labs Lab 09/10/15 0005  LIPASE 58*   No results for input(s): AMMONIA in the last 168 hours. CBC:  Recent Labs Lab 09/10/15 0005 09/11/15 0534  WBC 18.8* 10.7*  NEUTROABS 16.4*  --   HGB 11.2* 10.1*  HCT 36.0* 32.7*  MCV 77.9* 78.8  PLT 116* 102*   Cardiac Enzymes: No results for input(s): CKTOTAL, CKMB, CKMBINDEX, TROPONINI in the last 168 hours. BNP (last 3 results) No results for input(s): BNP in the last 8760 hours.  ProBNP (last 3 results) No results for input(s): PROBNP in the last 8760 hours.  CBG:  Recent Labs Lab 09/10/15 0809 09/10/15 1212 09/10/15 1647 09/10/15 2128 09/11/15 0756  GLUCAP 249* 206*  209* 217* 226*    No results found for this or any previous visit (from the past 240 hour(s)).   Studies: Ct Abdomen Pelvis W Contrast  09/10/2015   CLINICAL DATA:  Severe lower abdominal pain, perineal pain, urinary retention for several hours, leukocytosis, elevated lipase, hepatitis-C, diabetes mellitus, hypertension, former smoker  EXAM: CT ABDOMEN AND PELVIS WITH CONTRAST  TECHNIQUE: Multidetector CT imaging of the abdomen and pelvis was performed using the standard protocol following bolus administration of intravenous contrast. Sagittal and coronal MPR images reconstructed from axial data set.  CONTRAST:  OMNIPAQUE IOHEXOL 300 MG/ML SOLN IV. Dilute oral contrast.  COMPARISON:  None  FINDINGS: Lung bases clear.   Splenomegaly, spleen measuring 12.4 x 5.0 x 15.4 cm.  No additional focal abnormalities of the liver, spleen, pancreas, kidneys, or adrenal glands.  Contracted gallbladder.  Normal appendix.  Rectal wall thickening.  Stomach and remaining bowel loops normal appearance.  Normal size celiac and periportal nodes.  No mass, adenopathy, free air or free fluid.  Foley catheter and air within urinary bladder.  Bladder wall minimally thickened though bladder is incompletely distended, potentially artifact.  Unremarkable ureters.  Osseous structures unremarkable.  IMPRESSION: Mild rectal wall thickening, raising question of proctitis, recommend correlation with proctoscopy.  Minimal splenomegaly.   Electronically Signed   By: Ulyses Southward M.D.   On: 09/10/2015 02:37    Scheduled Meds: . sodium chloride   Intravenous STAT  . ciprofloxacin  400 mg Intravenous Q12H  . enoxaparin (LOVENOX) injection  40 mg Subcutaneous Q24H  . insulin aspart  0-9 Units Subcutaneous TID WC  . metronidazole  500 mg Intravenous Q8H  . nitroGLYCERIN  0.5 inch Topical TID  . sodium chloride  3 mL Intravenous Q12H   Continuous Infusions: . sodium chloride 100 mL/hr at 09/11/15 1610    Active Problems:   Anemia of chronic disease   Proctitis   Hypomagnesemia    Time spent: 25 min    AKULA,VIJAYA  [facility] Pager [phone]  If 7PM-7AM, please contact night-coverage at [email or web address], password [facility] 09/11/2015, 12:03 PM  LOS: 1 day

## 2015-09-12 DIAGNOSIS — R339 Retention of urine, unspecified: Secondary | ICD-10-CM | POA: Insufficient documentation

## 2015-09-12 DIAGNOSIS — D638 Anemia in other chronic diseases classified elsewhere: Secondary | ICD-10-CM

## 2015-09-12 LAB — CBC
HEMATOCRIT: 31.4 % — AB (ref 39.0–52.0)
HEMOGLOBIN: 9.8 g/dL — AB (ref 13.0–17.0)
MCH: 24.4 pg — ABNORMAL LOW (ref 26.0–34.0)
MCHC: 31.2 g/dL (ref 30.0–36.0)
MCV: 78.1 fL (ref 78.0–100.0)
Platelets: 99 10*3/uL — ABNORMAL LOW (ref 150–400)
RBC: 4.02 MIL/uL — AB (ref 4.22–5.81)
RDW: 14.7 % (ref 11.5–15.5)
WBC: 8.4 10*3/uL (ref 4.0–10.5)

## 2015-09-12 LAB — GLUCOSE, CAPILLARY
Glucose-Capillary: 230 mg/dL — ABNORMAL HIGH (ref 65–99)
Glucose-Capillary: 303 mg/dL — ABNORMAL HIGH (ref 65–99)

## 2015-09-12 MED ORDER — TAMSULOSIN HCL 0.4 MG PO CAPS: 0.4000 mg | ORAL_CAPSULE | Freq: Every day | ORAL | Status: DC

## 2015-09-12 MED ORDER — TAMSULOSIN HCL 0.4 MG PO CAPS
0.4000 mg | ORAL_CAPSULE | Freq: Once | ORAL | Status: AC
  Administered 2015-09-12: 0.4 mg via ORAL
  Filled 2015-09-12: qty 1

## 2015-09-12 MED ORDER — DOCUSATE SODIUM 100 MG PO CAPS: 100.0000 mg | ORAL_CAPSULE | Freq: Two times a day (BID) | ORAL | Status: AC

## 2015-09-12 MED ORDER — OXYCODONE HCL 5 MG PO TABS: 5.0000 mg | ORAL_TABLET | Freq: Four times a day (QID) | ORAL | Status: AC | PRN

## 2015-09-12 MED ORDER — POLYETHYLENE GLYCOL 3350 17 G PO PACK: 17.0000 g | PACK | Freq: Every day | ORAL | Status: AC | PRN

## 2015-09-12 MED ORDER — METRONIDAZOLE 500 MG PO TABS: 500.0000 mg | ORAL_TABLET | Freq: Three times a day (TID) | ORAL | Status: DC

## 2015-09-12 MED ORDER — GLIPIZIDE 5 MG PO TABS: 5.0000 mg | ORAL_TABLET | Freq: Every day | ORAL | Status: DC

## 2015-09-12 MED ORDER — CIPROFLOXACIN HCL 500 MG PO TABS: 500.0000 mg | ORAL_TABLET | Freq: Two times a day (BID) | ORAL | Status: DC

## 2015-09-12 NOTE — Progress Notes (Signed)
Talked with Dr. Susie Cassette about pt voided only 100cc and bladder scan showed 572cc. Ordered a single dose of Flomax and states watch him for four more hours to see if he can void.

## 2015-09-12 NOTE — Progress Notes (Signed)
UNASSIGNED PATIENT Subjective: Since I last evaluated the patient, he seems to be doing somewhat better. Rectal pain has improved.   Objective: Vital signs in last 24 hours: Temp:  [98.3 F (36.8 C)-99.8 F (37.7 C)] 98.3 F (36.8 C) (09/25 4098) Pulse Rate:  [72-78] 72 (09/25 0638) Resp:  [18-19] 18 (09/25 1191) BP: (142-145)/(74-84) 145/84 mmHg (09/25 0638) SpO2:  [99 %-100 %] 100 % (09/25 4782) Last BM Date: 09/09/15  Intake/Output from previous day: 09/24 0701 - 09/25 0700 In: 1640 [P.O.:240; I.V.:1200; IV Piggyback:200] Out: 3275 [Urine:3275] Intake/Output this shift:    General appearance: alert, cooperative and no distress Resp: clear to auscultation bilaterally Cardio: regular rate and rhythm, S1, S2 normal, no murmur, click, rub or gallop GI: soft, non-tender; bowel sounds normal; no masses,  no organomegaly  Lab Results:  Recent Labs  09/10/15 0005 09/11/15 0534 09/12/15 0423  WBC 18.8* 10.7* 8.4  HGB 11.2* 10.1* 9.8*  HCT 36.0* 32.7* 31.4*  PLT 116* 102* 99*   BMET  Recent Labs  09/10/15 0005 09/11/15 0534  NA 131* 135  K 3.7 3.7  CL 102 104  CO2 22 23  GLUCOSE 215* 165*  BUN 13 11  CREATININE 0.93 0.82  CALCIUM 9.0 8.1*   LFT  Recent Labs  09/11/15 0534  PROT 7.9  ALBUMIN 3.1*  AST 69*  ALT 60  ALKPHOS 48  BILITOT 3.1*   Medications: I have reviewed the patient's current medications.  Assessment/Plan: Chronic constipation with and anal fissure: patient advised to follow a high fiber diet with liberal fluid intake. Colace 200 mg PO BID will be helpful. OP follow up is advised with Dr. Elnoria Howard.   LOS: 2 days   Kyndahl Jablon 09/12/2015, 10:09 AM

## 2015-09-12 NOTE — Discharge Summary (Addendum)
Physician Discharge Summary  [name] [name] MRN: [id number] DOB/AGE: [birth year] 57 y.o.  PCP: No PCP Per Patient   Admit date: 09/09/2015 Discharge date: 09/12/2015  Discharge Diagnoses:     Active Problems:   Anemia of chronic disease   Proctitis   Hypomagnesemia   Urinary retention    Follow-up recommendations Follow-up with PCP in 3-5 days , including all  additional recommended appointments as below Follow-up CBC, CMP in 3-5 days Follow-up with Dr. Carol Ada,     Medication List    STOP taking these medications        aspirin-sod bicarb-citric acid 325 MG Tbef tablet  Commonly known as:  ALKA-SELTZER     lisinopril 10 MG tablet  Commonly known as:  PRINIVIL,ZESTRIL      TAKE these medications        bisacodyl 5 MG EC tablet  Commonly known as:  DULCOLAX  Take 5 mg by mouth daily as needed for moderate constipation.     ciprofloxacin 500 MG tablet  Commonly known as:  CIPRO  Take 1 tablet (500 mg total) by mouth 2 (two) times daily.     docusate sodium 100 MG capsule  Commonly known as:  COLACE  Take 1 capsule (100 mg total) by mouth 2 (two) times daily.     glipiZIDE 5 MG tablet  Commonly known as:  GLUCOTROL  Take 1 tablet (5 mg total) by mouth daily before breakfast.     metFORMIN 500 MG tablet  Commonly known as:  GLUCOPHAGE  Take 1 tablet (500 mg total) by mouth 2 (two) times daily with a meal.     metroNIDAZOLE 500 MG tablet  Commonly known as:  FLAGYL  Take 1 tablet (500 mg total) by mouth 3 (three) times daily.     omeprazole 20 MG capsule  Commonly known as:  PRILOSEC  Take 1 capsule (20 mg total) by mouth 2 (two) times daily before a meal.     oxyCODONE 5 MG immediate release tablet  Commonly known as:  Oxy IR/ROXICODONE  Take 1 tablet (5 mg total) by mouth every 6 (six) hours as needed for moderate pain.     polyethylene glycol packet  Commonly known as:  MIRALAX / GLYCOLAX  Take 17 g by mouth daily as needed for mild constipation.          Discharge Condition:   Disposition: Final discharge disposition not confirmed   Consults: Gastroenterology   Significant Diagnostic Studies:  Ct Abdomen Pelvis W Contrast  09/10/2015   CLINICAL DATA:  Severe lower abdominal pain, perineal pain, urinary retention for several hours, leukocytosis, elevated lipase, hepatitis-C, diabetes mellitus, hypertension, former smoker  EXAM: CT ABDOMEN AND PELVIS WITH CONTRAST  TECHNIQUE: Multidetector CT imaging of the abdomen and pelvis was performed using the standard protocol following bolus administration of intravenous contrast. Sagittal and coronal MPR images reconstructed from axial data set.  CONTRAST:  157m OMNIPAQUE IOHEXOL 300 MG/ML SOLN IV. Dilute oral contrast.  COMPARISON:  None  FINDINGS: Lung bases clear.  Splenomegaly, spleen measuring 12.4 x 5.0 x 15.4 cm.  No additional focal abnormalities of the liver, spleen, pancreas, kidneys, or adrenal glands.  Contracted gallbladder.  Normal appendix.  Rectal wall thickening.  Stomach and remaining bowel loops normal appearance.  Normal size celiac and periportal nodes.  No mass, adenopathy, free air or free fluid.  Foley catheter and air within urinary bladder.  Bladder wall minimally thickened though bladder is incompletely distended, potentially artifact.  Unremarkable  ureters.  Osseous structures unremarkable.  IMPRESSION: Mild rectal wall thickening, raising question of proctitis, recommend correlation with proctoscopy.  Minimal splenomegaly.   Electronically Signed   By: Lavonia Dana M.D.   On: 09/10/2015 02:37        Filed Weights   09/10/15 6754  Weight: 69.809 kg (153 lb 14.4 oz)     Microbiology: Recent Results (from the past 240 hour(s))  Culture, blood (routine x 2)     Status: None (Preliminary result)   Collection Time: 09/10/15  5:15 AM  Result Value Ref Range Status   Specimen Description BLOOD LEFT ANTECUBITAL  Final   Special Requests BOTTLES DRAWN AEROBIC AND  ANAEROBIC 5CC  Final   Culture   Final    NO GROWTH 1 DAY Performed at [facility]    Report Status PENDING  Incomplete  Culture, blood (routine x 2)     Status: None (Preliminary result)   Collection Time: 09/10/15  5:15 AM  Result Value Ref Range Status   Specimen Description BLOOD RIGHT ARM  Final   Special Requests BOTTLES DRAWN AEROBIC AND ANAEROBIC 5CC  Final   Culture   Final    NO GROWTH 1 DAY Performed at [facility]    Report Status PENDING  Incomplete       Blood Culture    Component Value Date/Time   SDES BLOOD LEFT ANTECUBITAL 09/10/2015 0515   SDES BLOOD RIGHT ARM 09/10/2015 0515   SPECREQUEST BOTTLES DRAWN AEROBIC AND ANAEROBIC 5CC 09/10/2015 0515   SPECREQUEST BOTTLES DRAWN AEROBIC AND ANAEROBIC 5CC 09/10/2015 0515   CULT  09/10/2015 0515    NO GROWTH 1 DAY Performed at [facility]  09/10/2015 0515    NO GROWTH 1 DAY Performed at [place] PENDING 09/10/2015 0515   REPTSTATUS PENDING 09/10/2015 0515      Labs: Results for orders placed or performed during the hospital encounter of 09/09/15 (from the past 48 hour(s))  Glucose, capillary     Status: Abnormal   Collection Time: 09/10/15 12:12 PM  Result Value Ref Range   Glucose-Capillary 206 (H) 65 - 99 mg/dL  Glucose, capillary     Status: Abnormal   Collection Time: 09/10/15  4:47 PM  Result Value Ref Range   Glucose-Capillary 209 (H) 65 - 99 mg/dL  Glucose, capillary     Status: Abnormal   Collection Time: 09/10/15  9:28 PM  Result Value Ref Range   Glucose-Capillary 217 (H) 65 - 99 mg/dL   Comment 1 Notify RN   Comprehensive metabolic panel     Status: Abnormal   Collection Time: 09/11/15  5:34 AM  Result Value Ref Range   Sodium 135 135 - 145 mmol/L   Potassium 3.7 3.5 - 5.1 mmol/L   Chloride 104 101 - 111 mmol/L   CO2 23 22 - 32 mmol/L   Glucose, Bld 165 (H) 65 - 99 mg/dL   BUN 11 6 - 20 mg/dL   Creatinine, Ser 0.82 0.61 - 1.24  mg/dL   Calcium 8.1 (L) 8.9 - 10.3 mg/dL   Total Protein 7.9 6.5 - 8.1 g/dL   Albumin 3.1 (L) 3.5 - 5.0 g/dL   AST 69 (H) 15 - 41 U/L   ALT 60 17 - 63 U/L   Alkaline Phosphatase 48 38 - 126 U/L   Total Bilirubin 3.1 (H) 0.3 - 1.2 mg/dL   GFR calc non Af Amer >60 >  60 mL/min   GFR calc Af Amer >60 >60 mL/min    Comment: (NOTE) The eGFR has been calculated using the CKD EPI equation. This calculation has not been validated in all clinical situations. eGFR's persistently <60 mL/min signify possible Chronic Kidney Disease.    Anion gap 8 5 - 15  CBC     Status: Abnormal   Collection Time: 09/11/15  5:34 AM  Result Value Ref Range   WBC 10.7 (H) 4.0 - 10.5 K/uL   RBC 4.15 (L) 4.22 - 5.81 MIL/uL   Hemoglobin 10.1 (L) 13.0 - 17.0 g/dL   HCT 32.7 (L) 39.0 - 52.0 %   MCV 78.8 78.0 - 100.0 fL   MCH 24.3 (L) 26.0 - 34.0 pg   MCHC 30.9 30.0 - 36.0 g/dL   RDW 14.5 11.5 - 15.5 %   Platelets 102 (L) 150 - 400 K/uL    Comment: CONSISTENT WITH PREVIOUS RESULT  Magnesium     Status: None   Collection Time: 09/11/15  5:34 AM  Result Value Ref Range   Magnesium 1.8 1.7 - 2.4 mg/dL  Glucose, capillary     Status: Abnormal   Collection Time: 09/11/15  7:56 AM  Result Value Ref Range   Glucose-Capillary 226 (H) 65 - 99 mg/dL  Glucose, capillary     Status: Abnormal   Collection Time: 09/11/15 12:11 PM  Result Value Ref Range   Glucose-Capillary 267 (H) 65 - 99 mg/dL  Glucose, capillary     Status: Abnormal   Collection Time: 09/11/15  4:26 PM  Result Value Ref Range   Glucose-Capillary 234 (H) 65 - 99 mg/dL  Glucose, capillary     Status: Abnormal   Collection Time: 09/11/15  9:47 PM  Result Value Ref Range   Glucose-Capillary 279 (H) 65 - 99 mg/dL   Comment 1 Notify RN   CBC     Status: Abnormal   Collection Time: 09/12/15  4:23 AM  Result Value Ref Range   WBC 8.4 4.0 - 10.5 K/uL   RBC 4.02 (L) 4.22 - 5.81 MIL/uL   Hemoglobin 9.8 (L) 13.0 - 17.0 g/dL   HCT 31.4 (L) 39.0 - 52.0 %    MCV 78.1 78.0 - 100.0 fL   MCH 24.4 (L) 26.0 - 34.0 pg   MCHC 31.2 30.0 - 36.0 g/dL   RDW 14.7 11.5 - 15.5 %   Platelets 99 (L) 150 - 400 K/uL    Comment: CONSISTENT WITH PREVIOUS RESULT  Glucose, capillary     Status: Abnormal   Collection Time: 09/12/15  7:32 AM  Result Value Ref Range   Glucose-Capillary 230 (H) 65 - 99 mg/dL     Lipid Panel     Component Value Date/Time   CHOL 109 04/05/2015 1914   TRIG 103 04/05/2015 1914   HDL 31* 04/05/2015 1914   CHOLHDL 3.5 04/05/2015 1914   VLDL 21 04/05/2015 1914   LDLCALC 57 04/05/2015 1914     Lab Results  Component Value Date   HGBA1C 6.3 04/05/2015   HGBA1C 8.0 07/28/2014     Lab Results  Component Value Date   MICROALBUR 8.5* 04/05/2015   LDLCALC 57 04/05/2015   CREATININE 0.82 09/11/2015     HPI :59 year old male with a history of diabetes type 2, hypertension, hepatitis C who presents to the emergency room because of rectal pain, urinary retention, difficulty defecating. Symptoms have been ongoing for 1-2 days. No prior history of having a colonoscopy according to the  patient. No nausea vomiting or fever. No hematochezia or melena. CT scan shows proctocolitis, white count is 18,000. Denies any family history of inflammatory bowel disease   HOSPITAL COURSE:  1) Rectal pain secondary to an anal fissure/history of constipation: continue NTG and a scheduled bowel regime for the patient with Colace 100 mg 2 PO BID along with liberal fluid intake. He should see Dr. Benson Norway as an OP basis. Patient to continue with empiric antibiotics for another 7 days  2) Hepatitis C with splenomegaly.   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 Hemoglobin A1c still pending  Anemia of chronic disease hemoglobin 11.2 MCV 77.9, platelets 116, mild splenomegaly and history of hepatitis C, possibly also has  underlying cirrhosis   Hypomagnesemia-repleted  Urinary retention: started on flomax , will need urology consult  Discharge Exam:    Blood pressure 145/84, pulse 72, temperature 98.3 F (36.8 C), temperature source Oral, resp. rate 18, height 5' 4"  (1.626 m), weight 69.809 kg (153 lb 14.4 oz), SpO2 100 %.  General appearance: alert, cooperative, appears stated age and no distress Resp: clear to auscultation bilaterally Cardio: regular rate and rhythm, S1, S2 normal, no murmur, click, rub or gallop GI: soft, non-tender; bowel sounds normal; no masses, no organomegaly        Discharge Instructions    Diet - low sodium heart healthy    Complete by:  As directed      Increase activity slowly    Complete by:  As directed            Follow-up Information    Follow up with pcp. Schedule an appointment as soon as possible for a visit in 1 week.      Follow up with HUNG,PATRICK D, MD. Schedule an appointment as soon as possible for a visit in 1 week.   Specialty:  Gastroenterology   Contact information:   [address], [place] [address] [phone]       Signed: Reyne Dumas 09/12/2015, 10:41 AM        Time spent >45 mins

## 2015-09-12 NOTE — Progress Notes (Signed)
Patient had episode of nausea this am with no vomiting.

## 2015-09-13 LAB — HEMOGLOBIN A1C
HEMOGLOBIN A1C: 7.4 % — AB (ref 4.8–5.6)
MEAN PLASMA GLUCOSE: 166 mg/dL

## 2015-09-14 ENCOUNTER — Emergency Department (HOSPITAL_COMMUNITY)
Admission: EM | Admit: 2015-09-14 | Discharge: 2015-09-14 | Disposition: A | Discharge status: Home / Self Care | Payer: 59 | Attending: Emergency Medicine | Admitting: Emergency Medicine

## 2015-09-14 ENCOUNTER — Encounter (HOSPITAL_COMMUNITY): Payer: Self-pay | Admitting: *Deleted

## 2015-09-14 DIAGNOSIS — Z792 Long term (current) use of antibiotics: Secondary | ICD-10-CM | POA: Insufficient documentation

## 2015-09-14 DIAGNOSIS — I10 Essential (primary) hypertension: Secondary | ICD-10-CM | POA: Insufficient documentation

## 2015-09-14 DIAGNOSIS — Z87891 Personal history of nicotine dependence: Secondary | ICD-10-CM | POA: Diagnosis not present

## 2015-09-14 DIAGNOSIS — Z8619 Personal history of other infectious and parasitic diseases: Secondary | ICD-10-CM | POA: Insufficient documentation

## 2015-09-14 DIAGNOSIS — Z79899 Other long term (current) drug therapy: Secondary | ICD-10-CM | POA: Diagnosis not present

## 2015-09-14 DIAGNOSIS — K5900 Constipation, unspecified: Secondary | ICD-10-CM | POA: Insufficient documentation

## 2015-09-14 DIAGNOSIS — K6289 Other specified diseases of anus and rectum: Secondary | ICD-10-CM | POA: Insufficient documentation

## 2015-09-14 DIAGNOSIS — R1030 Lower abdominal pain, unspecified: Secondary | ICD-10-CM

## 2015-09-14 DIAGNOSIS — R34 Anuria and oliguria: Secondary | ICD-10-CM | POA: Insufficient documentation

## 2015-09-14 DIAGNOSIS — K644 Residual hemorrhoidal skin tags: Secondary | ICD-10-CM | POA: Insufficient documentation

## 2015-09-14 DIAGNOSIS — E119 Type 2 diabetes mellitus without complications: Secondary | ICD-10-CM | POA: Insufficient documentation

## 2015-09-14 LAB — URINALYSIS, ROUTINE W REFLEX MICROSCOPIC
BILIRUBIN URINE: NEGATIVE
Glucose, UA: 500 mg/dL — AB
Hgb urine dipstick: NEGATIVE
Ketones, ur: NEGATIVE mg/dL
Leukocytes, UA: NEGATIVE
NITRITE: NEGATIVE
PH: 7 (ref 5.0–8.0)
Protein, ur: NEGATIVE mg/dL
SPECIFIC GRAVITY, URINE: 1.01 (ref 1.005–1.030)
UROBILINOGEN UA: 0.2 mg/dL (ref 0.0–1.0)

## 2015-09-14 LAB — CBC WITH DIFFERENTIAL/PLATELET
BASOS ABS: 0.1 10*3/uL (ref 0.0–0.1)
BASOS PCT: 1 %
EOS ABS: 0.2 10*3/uL (ref 0.0–0.7)
Eosinophils Relative: 3 %
HEMATOCRIT: 31.4 % — AB (ref 39.0–52.0)
HEMOGLOBIN: 9.8 g/dL — AB (ref 13.0–17.0)
Lymphocytes Relative: 22 %
Lymphs Abs: 1.4 10*3/uL (ref 0.7–4.0)
MCH: 24.2 pg — ABNORMAL LOW (ref 26.0–34.0)
MCHC: 31.2 g/dL (ref 30.0–36.0)
MCV: 77.5 fL — ABNORMAL LOW (ref 78.0–100.0)
Monocytes Absolute: 0.6 10*3/uL (ref 0.1–1.0)
Monocytes Relative: 10 %
NEUTROS ABS: 4.3 10*3/uL (ref 1.7–7.7)
NEUTROS PCT: 65 %
Platelets: 143 10*3/uL — ABNORMAL LOW (ref 150–400)
RBC: 4.05 MIL/uL — AB (ref 4.22–5.81)
RDW: 14.5 % (ref 11.5–15.5)
WBC: 6.6 10*3/uL (ref 4.0–10.5)

## 2015-09-14 LAB — COMPREHENSIVE METABOLIC PANEL
ALBUMIN: 3.5 g/dL (ref 3.5–5.0)
ALK PHOS: 59 U/L (ref 38–126)
ALT: 98 U/L — ABNORMAL HIGH (ref 17–63)
ANION GAP: 4 — AB (ref 5–15)
AST: 194 U/L — AB (ref 15–41)
BILIRUBIN TOTAL: 1.4 mg/dL — AB (ref 0.3–1.2)
BUN: 8 mg/dL (ref 6–20)
CALCIUM: 8.7 mg/dL — AB (ref 8.9–10.3)
CO2: 25 mmol/L (ref 22–32)
Chloride: 105 mmol/L (ref 101–111)
Creatinine, Ser: 0.72 mg/dL (ref 0.61–1.24)
GFR calc Af Amer: >60 mL/min (ref >60)
GFR calc non Af Amer: >60 mL/min (ref >60)
GLUCOSE: 206 mg/dL — AB (ref 65–99)
Potassium: 3.5 mmol/L (ref 3.5–5.1)
SODIUM: 134 mmol/L — AB (ref 135–145)
TOTAL PROTEIN: 8.6 g/dL — AB (ref 6.5–8.1)

## 2015-09-14 LAB — LIPASE, BLOOD: Lipase: 88 U/L — ABNORMAL HIGH (ref 22–51)

## 2015-09-14 MED ORDER — DICYCLOMINE HCL 20 MG PO TABS: 20.0000 mg | ORAL_TABLET | Freq: Two times a day (BID) | ORAL | Status: AC

## 2015-09-14 NOTE — ED Provider Notes (Signed)
CSN: [id number]     Arrival date & time 09/14/15  1024 History   First MD Initiated Contact with Patient 09/14/15 1132     Chief Complaint  Patient presents with  . Rectal Pain    Rectal pain started 9/25      (Consider location/radiation/quality/duration/timing/severity/associated sxs/prior Treatment) HPI Comments: Pt is a 59 yo male who presents to the ED with complaint of rectal pain. Pt was admitted on 9/23 for proctitis and d/c on 9/25. Pt reports his pain has worsened since d/c. He notes he has been taking all his medications as prescribed. He also endorses urinary retention, lower abdominal pain, constipation. He notes he has only been able to have small BMs since d/c. Pt also reports dec. Urinary output and notes he only has "dribbles" with voiding. Denies fever, chills, CP, SOB, N/V, dysuria, hematuria, penile/testicular pain, diarrhea, blood in stool, rectal bleeding, back pain, numbness, tingling, weakness.    Past Medical History  Diagnosis Date  . Hepatitis C   . Diabetes mellitus without complication   . Hypertension    History reviewed. No pertinent past surgical history. History reviewed. No pertinent family history. Social History  Substance Use Topics  . Smoking status: Former Smoker    Quit date: 05/09/1996  . Smokeless tobacco: Never Used  . Alcohol Use: 3.0 oz/week    5 Glasses of wine per week     Comment: social    Review of Systems  Gastrointestinal: Positive for abdominal pain, constipation and rectal pain.  Genitourinary: Positive for decreased urine volume.  All other systems reviewed and are negative.     Allergies  Review of patient's allergies indicates no known allergies.  Home Medications   Prior to Admission medications   Medication Sig Start Date End Date Taking? Authorizing Provider  ciprofloxacin (CIPRO) 500 MG tablet Take 1 tablet (500 mg total) by mouth 2 (two) times daily. 09/12/15  Yes Richarda Overlie, MD  glipiZIDE (GLUCOTROL) 5 MG  tablet Take 1 tablet (5 mg total) by mouth daily before breakfast. 09/12/15  Yes Richarda Overlie, MD  metroNIDAZOLE (FLAGYL) 500 MG tablet Take 1 tablet (500 mg total) by mouth 3 (three) times daily. 09/12/15  Yes Richarda Overlie, MD  oxyCODONE (OXY IR/ROXICODONE) 5 MG immediate release tablet Take 1 tablet (5 mg total) by mouth every 6 (six) hours as needed for moderate pain. 09/12/15  Yes Richarda Overlie, MD  polyethylene glycol (MIRALAX / GLYCOLAX) packet Take 17 g by mouth daily as needed for mild constipation. 09/12/15  Yes Richarda Overlie, MD  tamsulosin (FLOMAX) 0.4 MG CAPS capsule Take 1 capsule (0.4 mg total) by mouth daily. 09/12/15  Yes Richarda Overlie, MD  docusate sodium (COLACE) 100 MG capsule Take 1 capsule (100 mg total) by mouth 2 (two) times daily. Patient not taking: Reported on 09/14/2015 09/12/15   Richarda Overlie, MD  metFORMIN (GLUCOPHAGE) 500 MG tablet Take 1 tablet (500 mg total) by mouth 2 (two) times daily with a meal. Patient not taking: Reported on 09/14/2015 04/05/15   Collie Siad English, PA  omeprazole (PRILOSEC) 20 MG capsule Take 1 capsule (20 mg total) by mouth 2 (two) times daily before a meal. Patient not taking: Reported on 09/14/2015 04/05/15   Collie Siad English, PA   BP 144/65 mmHg  Pulse 68  Temp(Src) 98.3 F (36.8 C) (Oral)  Resp 18  Ht  (1.626 m)  Wt 156 lb (70.761 kg)  BMI 26.76 kg/m2  SpO2 98% Physical Exam  Constitutional: He  is oriented to person, place, and time. He appears well-developed and well-nourished. No distress.  HENT:  Head: Normocephalic and atraumatic.  Mouth/Throat: Oropharynx is clear and moist. No oropharyngeal exudate.  Eyes: Conjunctivae and EOM are normal. Pupils are equal, round, and reactive to light. Right eye exhibits no discharge. Left eye exhibits no discharge. No scleral icterus.  Neck: Normal range of motion. Neck supple.  Cardiovascular: Normal rate, regular rhythm, normal heart sounds and intact distal pulses.   Pulmonary/Chest:  Effort normal and breath sounds normal. He has no wheezes. He has no rales. He exhibits no tenderness.  Abdominal: Soft. Bowel sounds are normal. He exhibits no mass. There is tenderness (LLQ, RLQ and suprapubic tenderness). There is no rebound and no guarding.  Genitourinary:  External hemorrhoid noted at 6 o'clock region, no thrombosis, no bleeding. No fissures.   Musculoskeletal: Normal range of motion. He exhibits no edema.  Lymphadenopathy:    He has no cervical adenopathy.  Neurological: He is alert and oriented to person, place, and time. He has normal strength. No sensory deficit. Coordination normal.  Skin: Skin is warm and dry. He is not diaphoretic.  Nursing note and vitals reviewed.   ED Course  Procedures (including critical care time) Labs Review Labs Reviewed  CBC WITH DIFFERENTIAL/PLATELET  COMPREHENSIVE METABOLIC PANEL  LIPASE, BLOOD  URINALYSIS, ROUTINE W REFLEX MICROSCOPIC (NOT AT [facility])    Imaging Review No results found. I have personally reviewed and evaluated these images and lab results as part of my medical decision-making.  Filed Vitals:   09/14/15 1336  BP: 133/82  Pulse: 69  Temp: 98.1 F (36.7 C)  Resp: 17   Meds given in ED:  Medications - No data to display  New Prescriptions   DICYCLOMINE (BENTYL) 20 MG TABLET    Take 1 tablet (20 mg total) by mouth 2 (two) times daily.     MDM   Final diagnoses:  Proctitis  Lower abdominal pain    Pt presents with rectal pain, lower abdominal pain and difficulty voiding. He notes he has been constipated but endorses having multiple small BMs daily. Denies fever, N/V, back pain, rectal bleeding. Pt was admitted on 9/23 for proctitis and was d/c 9/25 and started on cipro and flagyl. Pt reports he has taken one dose of his antibiotics. History of Hep C, DM, HTN. VSS. Lower abdomen TTP, external hemorrhoid noted on rectal exam.   Pt able to urinate over 600cc. Post void residual US revealed 170cc. Lab  values at pt's baseline. Pt reports abdominal pain improved after voiding. Plan to d/c pt home with bentyl. Advised pt to continue taking Cipro and Flagyl for proctitis along with taking his rx of oxycodone for pain relief. Pt has follow up appointment with Dr. Elnoria Howard (GI).   Evaluation does not show pathology requring ongoing emergent intervention or admission. Pt is hemodynamically stable and mentating appropriately. Discussed findings/results and plan with patient/guardian, who agrees with plan. All questions answered. Return precautions discussed and outpatient follow up given.      Satira Sark [place], [place] 09/14/15 1437  Tilden Fossa, MD 09/16/15 [phone]

## 2015-09-14 NOTE — ED Notes (Signed)
Discussed and gave discharge paperwork. The patient expresses no further questions or needs. Departed ambulatory with no apparent distress.

## 2015-09-14 NOTE — ED Notes (Signed)
Patient ambulated to restroom.

## 2015-09-14 NOTE — ED Notes (Signed)
The patient presents with anal/rectal pain onset 2 days ago; he was seen at [facility] on 9/25th. Per the patient he is unable to sit due to the pain, and is also has had difficulty voiding.

## 2015-09-14 NOTE — Discharge Instructions (Signed)
Please take Bentyl as prescribed for abdominal pain. Continue taking your antibiotics and prescribed pain medications as prescribed. Please follow up with your primary care provider in 3-4 days. Please return to the Emergency Department if symptoms worsen or new onset of fever, vomiting, blood in stool, blood in urine, unable to urinate.

## 2015-09-14 NOTE — Progress Notes (Signed)
Cm reviewed EPIC chart information for pt Cm inquired which provider at [facility] provider he prefers Pt confirms he does not have a preference and has seen various providers at [facility]  CM updated EPIC by entering Dr Merla Riches

## 2015-09-15 LAB — CULTURE, BLOOD (ROUTINE X 2)
CULTURE: NO GROWTH
CULTURE: NO GROWTH

## 2015-09-15 LAB — GLUCOSE, CAPILLARY: GLUCOSE-CAPILLARY: 261 mg/dL — AB (ref 65–99)

## 2015-09-29 ENCOUNTER — Other Ambulatory Visit: Payer: Self-pay | Admitting: Gastroenterology

## 2015-09-29 DIAGNOSIS — B182 Chronic viral hepatitis C: Secondary | ICD-10-CM

## 2015-10-20 ENCOUNTER — Telehealth: Payer: Self-pay | Admitting: Family Medicine

## 2015-10-20 ENCOUNTER — Encounter: Payer: Self-pay | Admitting: Family Medicine

## 2015-10-20 ENCOUNTER — Telehealth (HOSPITAL_COMMUNITY): Payer: Self-pay

## 2015-10-20 NOTE — Telephone Encounter (Signed)
Left a message for patient to return call to schedule appointment for diabetes, med adhearance, and find out when his last eye exam was and where it was at.

## 2015-10-20 NOTE — Telephone Encounter (Signed)
Called to remind pt of appt at [facility] on 10/21/15, left message for pt to call back. AW

## 2015-10-21 ENCOUNTER — Ambulatory Visit (HOSPITAL_COMMUNITY): Payer: 59 | Attending: Gastroenterology

## 2015-11-15 ENCOUNTER — Encounter: Payer: Self-pay | Admitting: Internal Medicine

## 2016-03-09 ENCOUNTER — Ambulatory Visit (INDEPENDENT_AMBULATORY_CARE_PROVIDER_SITE_OTHER): Payer: 59 | Admitting: Family Medicine

## 2016-03-09 VITALS — BP 120/74 | HR 74 | Temp 98.0°F | Resp 17 | Ht 60.0 in | Wt 148.0 lb

## 2016-03-09 DIAGNOSIS — J209 Acute bronchitis, unspecified: Secondary | ICD-10-CM | POA: Diagnosis not present

## 2016-03-09 DIAGNOSIS — E119 Type 2 diabetes mellitus without complications: Secondary | ICD-10-CM

## 2016-03-09 DIAGNOSIS — N400 Enlarged prostate without lower urinary tract symptoms: Secondary | ICD-10-CM | POA: Diagnosis not present

## 2016-03-09 MED ORDER — GLIPIZIDE 5 MG PO TABS: 5.0000 mg | ORAL_TABLET | Freq: Every day | ORAL | Status: DC

## 2016-03-09 MED ORDER — METFORMIN HCL 500 MG PO TABS: 500.0000 mg | ORAL_TABLET | Freq: Two times a day (BID) | ORAL | Status: DC

## 2016-03-09 MED ORDER — AZITHROMYCIN 250 MG PO TABS: ORAL_TABLET | ORAL | Status: AC

## 2016-03-09 MED ORDER — TAMSULOSIN HCL 0.4 MG PO CAPS: 0.4000 mg | ORAL_CAPSULE | Freq: Every day | ORAL | Status: AC

## 2016-03-09 MED ORDER — HYDROCODONE-HOMATROPINE 5-1.5 MG/5ML PO SYRP: 5.0000 mL | ORAL_SOLUTION | Freq: Three times a day (TID) | ORAL | Status: AC | PRN

## 2016-03-09 NOTE — Progress Notes (Addendum)
Patient ID: [name] [name] MRN: [id number], DOB: 1956-10-10, 60 y.o. Date of Encounter: 03/09/2016, 7:34 PM  Primary Physician: Tonye PearsonOLITTLE, ROBERT P, MD  Chief Complaint:  Chief Complaint  Patient presents with  . Cough    X Friday  . Chest Congestion    X Friday  . Medication Refill    Glipizide, metformin, flomax    HPI: 60 y.o. year old male presents with a 6 day history of nasal congestion, post nasal drip, sore throat, and cough. Mild sinus pressure. Afebrile. No chills. Nasal congestion thick and green/yellow. Cough is productive of green/yellow sputum and not associated with time of day. Ears feel full, leading to sensation of muffled hearing. Has tried OTC cold preps without success. No GI complaints.   No sick contacts, recent antibiotics, or recent travels.   No leg trauma, sedentary periods, h/o cancer, or tobacco use.  Past Medical History  Diagnosis Date  . Hepatitis C   . Diabetes mellitus without complication (HCC)   . Hypertension      Home Meds: Prior to Admission medications   Medication Sig Start Date End Date Taking? Authorizing Provider  azithromycin (ZITHROMAX) 250 MG tablet Take 2 tabs PO x 1 dose, then 1 tab PO QD x 4 days 03/09/16   Elvina SidleKurt Ellieana Dolecki, MD  dicyclomine (BENTYL) 20 MG tablet Take 1 tablet (20 mg total) by mouth 2 (two) times daily. Patient not taking: Reported on 03/09/2016 09/14/15   Barrett HenleNicole Elizabeth Nadeau, PA-C  docusate sodium (COLACE) 100 MG capsule Take 1 capsule (100 mg total) by mouth 2 (two) times daily. Patient not taking: Reported on 09/14/2015 09/12/15   Richarda OverlieNayana Abrol, MD  glipiZIDE (GLUCOTROL) 5 MG tablet Take 1 tablet (5 mg total) by mouth daily before breakfast. 03/09/16   Elvina SidleKurt Ravin Denardo, MD  HYDROcodone-homatropine [facility](HYCODAN) 5-1.5 MG/5ML syrup Take 5 mLs by mouth every 8 (eight) hours as needed for cough. 03/09/16   Elvina SidleKurt Jode Lippe, MD  metFORMIN (GLUCOPHAGE) 500 MG tablet Take 1 tablet (500 mg total) by mouth 2 (two) times daily with  a meal. 03/09/16   Elvina SidleKurt Ladana Chavero, MD  omeprazole (PRILOSEC) 20 MG capsule Take 1 capsule (20 mg total) by mouth 2 (two) times daily before a meal. Patient not taking: Reported on 09/14/2015 04/05/15   Collie SiadStephanie D English, PA  oxyCODONE (OXY IR/ROXICODONE) 5 MG immediate release tablet Take 1 tablet (5 mg total) by mouth every 6 (six) hours as needed for moderate pain. Patient not taking: Reported on 03/09/2016 09/12/15   Richarda OverlieNayana Abrol, MD  polyethylene glycol (MIRALAX / GLYCOLAX) packet Take 17 g by mouth daily as needed for mild constipation. Patient not taking: Reported on 03/09/2016 09/12/15   Richarda OverlieNayana Abrol, MD  tamsulosin (FLOMAX) 0.4 MG CAPS capsule Take 1 capsule (0.4 mg total) by mouth daily. 03/09/16   Elvina SidleKurt Calene Paradiso, MD    Allergies: No Known Allergies  Social History   Social History  . Marital Status: Single    Spouse Name: N/A  . Number of Children: 3  . Years of Education: N/A   Occupational History  . WOOD WORK    Social History Main Topics  . Smoking status: Former Smoker    Quit date: 05/09/1996  . Smokeless tobacco: Never Used  . Alcohol Use: 3.0 oz/week    5 Glasses of wine per week     Comment: social  . Drug Use: No  . Sexual Activity: Yes   Other Topics Concern  . Not on file   Social  History Narrative   Marital status: single; dating girlfriend (age 34) x 11 years.  From [place].      Children: 3 sons (12, 49, 3) in [place]; 3 children in [place].      Employment: wood work      Tobacco: none; quit 1994.      Alcohol:  Beer on weekends.      Drugs: none          Review of Systems: Constitutional: negative for chills, fever, night sweats or weight changes Cardiovascular: negative for chest pain or palpitations Respiratory: negative for hemoptysis, wheezing, or shortness of breath Abdominal: negative for abdominal pain, nausea, vomiting or diarrhea Dermatological: negative for rash Neurologic: negative for headache   Physical Exam: Blood pressure  120/74, pulse 74, temperature 98 F (36.7 C), temperature source Oral, resp. rate 17, height 5' (1.524 m), weight 148 lb (67.132 kg), SpO2 98 %., Body mass index is 28.9 kg/(m^2). General: Well developed, well nourished, in no acute distress. Head: Normocephalic, atraumatic, eyes without discharge, sclera non-icteric, nares are congested. Bilateral auditory canals clear, TM's are without perforation, pearly grey with reflective cone of light bilaterally. No sinus TTP. Oral cavity moist, dentition normal. Posterior pharynx with post nasal drip and mild erythema. No peritonsillar abscess or tonsillar exudate. Neck: Supple. No thyromegaly. Full ROM. No lymphadenopathy. Lungs: Coarse breath sounds bilaterally without wheezes, rales, or rhonchi. Breathing is unlabored.  Heart: RRR with S1 S2. No murmurs, rubs, or gallops appreciated. Msk:  Strength and tone normal for age. Extremities: No clubbing or cyanosis. No edema. Neuro: Alert and oriented X 3. Moves all extremities spontaneously. CNII-XII grossly in tact. Psych:  Responds to questions appropriately with a normal affect.   Labs:   ASSESSMENT AND PLAN:  60 y.o. year old male with bronchitis. -   ICD-9-CM ICD-10-CM   1. Type 2 diabetes mellitus without complication, without long-term current use of insulin (HCC) 250.00 E11.9 metFORMIN (GLUCOPHAGE) 500 MG tablet     glipiZIDE (GLUCOTROL) 5 MG tablet     DISCONTINUED: metFORMIN (GLUCOPHAGE) 500 MG tablet     DISCONTINUED: glipiZIDE (GLUCOTROL) 5 MG tablet  2. BPH (benign prostatic hyperplasia) 600.00 N40.0 tamsulosin (FLOMAX) 0.4 MG CAPS capsule  3. Acute bronchitis, unspecified organism 466.0 J20.9 azithromycin (ZITHROMAX) 250 MG tablet     HYDROcodone-homatropine (HYCODAN) 5-1.5 MG/5ML syrup   -Tylenol/Motrin prn -Rest/fluids -RTC precautions -RTC 3-5 days if no improvement  Signed, Elvina Sidle, MD 03/09/2016 7:34 PM

## 2016-03-09 NOTE — Patient Instructions (Addendum)

## 2016-03-09 NOTE — Addendum Note (Signed)
Addended by: Elvina SidleLAUENSTEIN, Rogen Porte on: 03/09/2016 07:34 PM   Modules accepted: Orders

## 2017-06-03 ENCOUNTER — Encounter (HOSPITAL_COMMUNITY): Payer: Self-pay | Admitting: *Deleted

## 2017-06-03 ENCOUNTER — Ambulatory Visit (HOSPITAL_COMMUNITY)
Admission: EM | Admit: 2017-06-03 | Discharge: 2017-06-03 | Disposition: A | Discharge status: Home / Self Care | Payer: 59 | Attending: Internal Medicine | Admitting: Internal Medicine

## 2017-06-03 DIAGNOSIS — K047 Periapical abscess without sinus: Secondary | ICD-10-CM

## 2017-06-03 DIAGNOSIS — J209 Acute bronchitis, unspecified: Secondary | ICD-10-CM

## 2017-06-03 DIAGNOSIS — E119 Type 2 diabetes mellitus without complications: Secondary | ICD-10-CM

## 2017-06-03 DIAGNOSIS — R220 Localized swelling, mass and lump, head: Secondary | ICD-10-CM

## 2017-06-03 MED ORDER — CLINDAMYCIN HCL 150 MG PO CAPS: 150.0000 mg | ORAL_CAPSULE | Freq: Four times a day (QID) | ORAL | 0 refills | Status: AC

## 2017-06-03 MED ORDER — HYDROCODONE-ACETAMINOPHEN 5-325 MG PO TABS: 1.0000 | ORAL_TABLET | ORAL | 0 refills | Status: AC | PRN

## 2017-06-03 MED ORDER — METFORMIN HCL 500 MG PO TABS: 500.0000 mg | ORAL_TABLET | Freq: Two times a day (BID) | ORAL | 1 refills | Status: AC

## 2017-06-03 MED ORDER — GLIPIZIDE 5 MG PO TABS: 5.0000 mg | ORAL_TABLET | Freq: Every day | ORAL | 1 refills | Status: AC

## 2017-06-03 NOTE — ED Provider Notes (Signed)
CSN: [id number]     Arrival date & time 06/03/17  1222 History   None    Chief Complaint  Patient presents with  . Facial Swelling   (Consider location/radiation/quality/duration/timing/severity/associated sxs/prior Treatment) Patient c/o severe right jaw swelling and pain for 3 days.   The history is provided by the patient.  Dental Pain  Location:  Lower Lower teeth location:  27/RL cuspid Quality:  Aching Severity:  Severe Onset quality:  Sudden Duration:  3 days Timing:  Constant Progression:  Worsening   Past Medical History:  Diagnosis Date  . Diabetes mellitus without complication (HCC)   . Hepatitis C   . Hypertension    Past Surgical History:  Procedure Laterality Date  . FLEXIBLE SIGMOIDOSCOPY N/A 09/10/2015   Procedure: FLEXIBLE SIGMOIDOSCOPY;  Surgeon: Jeani HawkingPatrick Hung, MD;  Location: [facility];  Service: Endoscopy;  Laterality: N/A;   History reviewed. No pertinent family history. Social History  Substance Use Topics  . Smoking status: Former Smoker    Quit date: 05/09/1996  . Smokeless tobacco: Never Used  . Alcohol use 3.0 oz/week    5 Glasses of wine per week     Comment: social    Review of Systems  Constitutional: Negative.   HENT: Positive for dental problem.   Eyes: Negative.   Respiratory: Negative.   Cardiovascular: Negative.   Gastrointestinal: Negative.   Endocrine: Negative.   Genitourinary: Negative.   Musculoskeletal: Negative.   Allergic/Immunologic: Negative.   Neurological: Negative.   Hematological: Negative.   Psychiatric/Behavioral: Negative.     Allergies  Patient has no known allergies.  Home Medications   Prior to Admission medications   Medication Sig Start Date End Date Taking? Authorizing Provider  azithromycin (ZITHROMAX) 250 MG tablet Take 2 tabs PO x 1 dose, then 1 tab PO QD x 4 days 03/09/16   Elvina SidleLauenstein, Kurt, MD  clindamycin (CLEOCIN) 150 MG capsule Take 1 capsule (150 mg total) by mouth every 6 (six) hours.  06/03/17   Deatra Canterxford, William J, FNP  dicyclomine (BENTYL) 20 MG tablet Take 1 tablet (20 mg total) by mouth 2 (two) times daily. Patient not taking: Reported on 03/09/2016 09/14/15   Barrett HenleNadeau, Nicole Elizabeth, PA-C  docusate sodium (COLACE) 100 MG capsule Take 1 capsule (100 mg total) by mouth 2 (two) times daily. Patient not taking: Reported on 09/14/2015 09/12/15   Richarda OverlieAbrol, Nayana, MD  glipiZIDE (GLUCOTROL) 5 MG tablet Take 1 tablet (5 mg total) by mouth daily before breakfast. 06/03/17   Deatra Canterxford, William J, FNP  HYDROcodone-acetaminophen (NORCO/VICODIN) 5-325 MG tablet Take 1-2 tablets by mouth every 4 (four) hours as needed. 06/03/17   Deatra Canterxford, William J, FNP  HYDROcodone-homatropine (HYCODAN) 5-1.5 MG/5ML syrup Take 5 mLs by mouth every 8 (eight) hours as needed for cough. 03/09/16   Elvina SidleLauenstein, Kurt, MD  metFORMIN (GLUCOPHAGE) 500 MG tablet Take 1 tablet (500 mg total) by mouth 2 (two) times daily with a meal. 06/03/17   [place], Anselm PancoastWilliam J, FNP  omeprazole (PRILOSEC) 20 MG capsule Take 1 capsule (20 mg total) by mouth 2 (two) times daily before a meal. Patient not taking: Reported on 09/14/2015 04/05/15   Trena PlattEnglish, Stephanie D, PA  oxyCODONE (OXY IR/ROXICODONE) 5 MG immediate release tablet Take 1 tablet (5 mg total) by mouth every 6 (six) hours as needed for moderate pain. Patient not taking: Reported on 03/09/2016 09/12/15   Richarda OverlieAbrol, Nayana, MD  polyethylene glycol (MIRALAX / GLYCOLAX) packet Take 17 g by mouth daily as needed for mild constipation. Patient  not taking: Reported on 03/09/2016 09/12/15   Richarda Overlie, MD  tamsulosin (FLOMAX) 0.4 MG CAPS capsule Take 1 capsule (0.4 mg total) by mouth daily. 03/09/16   Elvina Sidle, MD   Meds Ordered and Administered this Visit  Medications - No data to display  BP 137/89 (BP Location: Left Arm)   Pulse 89   Temp 100.1 F (37.8 C) (Oral)   Resp 17   SpO2 100%  No data found.   Physical Exam  Constitutional: He is oriented to person, place, and time. He  appears well-developed and well-nourished.  HENT:  Head: Normocephalic and atraumatic.  Poor dentition  Eyes: Conjunctivae and EOM are normal. Pupils are equal, round, and reactive to light.  Neck: Normal range of motion. Neck supple.  Cardiovascular: Normal rate, regular rhythm and normal heart sounds.   Pulmonary/Chest: Effort normal and breath sounds normal.  Abdominal: Soft. Bowel sounds are normal.  Musculoskeletal: Normal range of motion.  Neurological: He is alert and oriented to person, place, and time.  Nursing note and vitals reviewed.   Urgent Care Course     Procedures (including critical care time)  Labs Review Labs Reviewed - No data to display  Imaging Review No results found.   Visual Acuity Review  Right Eye Distance:   Left Eye Distance:   Bilateral Distance:    Right Eye Near:   Left Eye Near:    Bilateral Near:         MDM   1. Dental abscess   2. Facial swelling   3. Acute bronchitis, unspecified organism   4. Type 2 diabetes mellitus without complication, without long-term current use of insulin (HCC)    Refill diabetic meds and explained needs to get in to see PCP. Clindamycin 150mg  one po qid  Norco 5mg  one po q 6 hours #12  Follow up in ED if swelling worsens      Deatra Canter, FNP 06/03/17 2023

## 2017-06-03 NOTE — ED Notes (Signed)
Patient states that he has not taken his "blood sugar" meds in over a year

## 2017-06-03 NOTE — ED Triage Notes (Signed)
Patient states that on Friday he started having right sided facial swelling, today patient with large amount of facial swelling. He states that his teeth do not hurt it is all his cheek. Patient with fevers. States the entire right side of his face is now hurting to include sore throat and headache.

## 2017-06-04 ENCOUNTER — Encounter (HOSPITAL_COMMUNITY): Payer: Self-pay | Admitting: Emergency Medicine

## 2017-06-04 ENCOUNTER — Emergency Department (HOSPITAL_COMMUNITY): Payer: 59

## 2017-06-04 ENCOUNTER — Inpatient Hospital Stay (HOSPITAL_COMMUNITY)
Admission: EM | Admit: 2017-06-04 | Discharge: 2017-07-18 | DRG: 003 | Disposition: E | Payer: 59 | Attending: Pulmonary Disease | Admitting: Pulmonary Disease

## 2017-06-04 DIAGNOSIS — E119 Type 2 diabetes mellitus without complications: Secondary | ICD-10-CM | POA: Diagnosis not present

## 2017-06-04 DIAGNOSIS — M726 Necrotizing fasciitis: Secondary | ICD-10-CM

## 2017-06-04 DIAGNOSIS — E875 Hyperkalemia: Secondary | ICD-10-CM | POA: Diagnosis present

## 2017-06-04 DIAGNOSIS — B182 Chronic viral hepatitis C: Secondary | ICD-10-CM | POA: Diagnosis not present

## 2017-06-04 DIAGNOSIS — Z930 Tracheostomy status: Secondary | ICD-10-CM | POA: Diagnosis not present

## 2017-06-04 DIAGNOSIS — A498 Other bacterial infections of unspecified site: Secondary | ICD-10-CM

## 2017-06-04 DIAGNOSIS — J189 Pneumonia, unspecified organism: Secondary | ICD-10-CM

## 2017-06-04 DIAGNOSIS — N179 Acute kidney failure, unspecified: Secondary | ICD-10-CM | POA: Diagnosis not present

## 2017-06-04 DIAGNOSIS — G92 Toxic encephalopathy: Secondary | ICD-10-CM | POA: Diagnosis present

## 2017-06-04 DIAGNOSIS — J9600 Acute respiratory failure, unspecified whether with hypoxia or hypercapnia: Secondary | ICD-10-CM

## 2017-06-04 DIAGNOSIS — B961 Klebsiella pneumoniae [K. pneumoniae] as the cause of diseases classified elsewhere: Secondary | ICD-10-CM | POA: Diagnosis present

## 2017-06-04 DIAGNOSIS — L0201 Cutaneous abscess of face: Secondary | ICD-10-CM | POA: Diagnosis present

## 2017-06-04 DIAGNOSIS — Z0189 Encounter for other specified special examinations: Secondary | ICD-10-CM

## 2017-06-04 DIAGNOSIS — L0211 Cutaneous abscess of neck: Secondary | ICD-10-CM | POA: Diagnosis present

## 2017-06-04 DIAGNOSIS — T17990A Other foreign object in respiratory tract, part unspecified in causing asphyxiation, initial encounter: Secondary | ICD-10-CM | POA: Diagnosis present

## 2017-06-04 DIAGNOSIS — H00032 Abscess of right lower eyelid: Secondary | ICD-10-CM | POA: Diagnosis present

## 2017-06-04 DIAGNOSIS — R17 Unspecified jaundice: Secondary | ICD-10-CM

## 2017-06-04 DIAGNOSIS — E1165 Type 2 diabetes mellitus with hyperglycemia: Secondary | ICD-10-CM | POA: Diagnosis present

## 2017-06-04 DIAGNOSIS — I4891 Unspecified atrial fibrillation: Secondary | ICD-10-CM | POA: Diagnosis not present

## 2017-06-04 DIAGNOSIS — L02811 Cutaneous abscess of head [any part, except face]: Secondary | ICD-10-CM | POA: Diagnosis not present

## 2017-06-04 DIAGNOSIS — E871 Hypo-osmolality and hyponatremia: Secondary | ICD-10-CM | POA: Diagnosis present

## 2017-06-04 DIAGNOSIS — A419 Sepsis, unspecified organism: Principal | ICD-10-CM | POA: Diagnosis present

## 2017-06-04 DIAGNOSIS — Y95 Nosocomial condition: Secondary | ICD-10-CM | POA: Diagnosis present

## 2017-06-04 DIAGNOSIS — R0902 Hypoxemia: Secondary | ICD-10-CM | POA: Diagnosis not present

## 2017-06-04 DIAGNOSIS — T508X5A Adverse effect of diagnostic agents, initial encounter: Secondary | ICD-10-CM | POA: Diagnosis present

## 2017-06-04 DIAGNOSIS — J9690 Respiratory failure, unspecified, unspecified whether with hypoxia or hypercapnia: Secondary | ICD-10-CM

## 2017-06-04 DIAGNOSIS — R001 Bradycardia, unspecified: Secondary | ICD-10-CM | POA: Diagnosis present

## 2017-06-04 DIAGNOSIS — D62 Acute posthemorrhagic anemia: Secondary | ICD-10-CM | POA: Diagnosis not present

## 2017-06-04 DIAGNOSIS — J390 Retropharyngeal and parapharyngeal abscess: Secondary | ICD-10-CM | POA: Diagnosis present

## 2017-06-04 DIAGNOSIS — R079 Chest pain, unspecified: Secondary | ICD-10-CM | POA: Diagnosis not present

## 2017-06-04 DIAGNOSIS — Z7984 Long term (current) use of oral hypoglycemic drugs: Secondary | ICD-10-CM

## 2017-06-04 DIAGNOSIS — E876 Hypokalemia: Secondary | ICD-10-CM | POA: Diagnosis not present

## 2017-06-04 DIAGNOSIS — E872 Acidosis: Secondary | ICD-10-CM | POA: Diagnosis present

## 2017-06-04 DIAGNOSIS — J988 Other specified respiratory disorders: Secondary | ICD-10-CM | POA: Diagnosis not present

## 2017-06-04 DIAGNOSIS — E43 Unspecified severe protein-calorie malnutrition: Secondary | ICD-10-CM | POA: Diagnosis present

## 2017-06-04 DIAGNOSIS — J9601 Acute respiratory failure with hypoxia: Secondary | ICD-10-CM | POA: Diagnosis present

## 2017-06-04 DIAGNOSIS — E11649 Type 2 diabetes mellitus with hypoglycemia without coma: Secondary | ICD-10-CM | POA: Diagnosis present

## 2017-06-04 DIAGNOSIS — L03211 Cellulitis of face: Secondary | ICD-10-CM

## 2017-06-04 DIAGNOSIS — R680 Hypothermia, not associated with low environmental temperature: Secondary | ICD-10-CM | POA: Diagnosis present

## 2017-06-04 DIAGNOSIS — R5082 Postprocedural fever: Secondary | ICD-10-CM | POA: Diagnosis present

## 2017-06-04 DIAGNOSIS — D696 Thrombocytopenia, unspecified: Secondary | ICD-10-CM | POA: Diagnosis not present

## 2017-06-04 DIAGNOSIS — Z79899 Other long term (current) drug therapy: Secondary | ICD-10-CM

## 2017-06-04 DIAGNOSIS — D500 Iron deficiency anemia secondary to blood loss (chronic): Secondary | ICD-10-CM | POA: Diagnosis not present

## 2017-06-04 DIAGNOSIS — R161 Splenomegaly, not elsewhere classified: Secondary | ICD-10-CM | POA: Diagnosis present

## 2017-06-04 DIAGNOSIS — E8770 Fluid overload, unspecified: Secondary | ICD-10-CM | POA: Diagnosis present

## 2017-06-04 DIAGNOSIS — I96 Gangrene, not elsewhere classified: Secondary | ICD-10-CM | POA: Diagnosis present

## 2017-06-04 DIAGNOSIS — K1121 Acute sialoadenitis: Secondary | ICD-10-CM | POA: Diagnosis present

## 2017-06-04 DIAGNOSIS — Z87891 Personal history of nicotine dependence: Secondary | ICD-10-CM | POA: Diagnosis not present

## 2017-06-04 DIAGNOSIS — Z9889 Other specified postprocedural states: Secondary | ICD-10-CM | POA: Diagnosis not present

## 2017-06-04 DIAGNOSIS — R740 Nonspecific elevation of levels of transaminase and lactic acid dehydrogenase [LDH]: Secondary | ICD-10-CM | POA: Diagnosis present

## 2017-06-04 DIAGNOSIS — I10 Essential (primary) hypertension: Secondary | ICD-10-CM | POA: Diagnosis present

## 2017-06-04 DIAGNOSIS — K1120 Sialoadenitis, unspecified: Secondary | ICD-10-CM

## 2017-06-04 DIAGNOSIS — R6521 Severe sepsis with septic shock: Secondary | ICD-10-CM | POA: Diagnosis present

## 2017-06-04 DIAGNOSIS — H00031 Abscess of right upper eyelid: Secondary | ICD-10-CM | POA: Diagnosis present

## 2017-06-04 DIAGNOSIS — R0689 Other abnormalities of breathing: Secondary | ICD-10-CM | POA: Diagnosis present

## 2017-06-04 DIAGNOSIS — Z9911 Dependence on respirator [ventilator] status: Secondary | ICD-10-CM | POA: Diagnosis not present

## 2017-06-04 DIAGNOSIS — N170 Acute kidney failure with tubular necrosis: Secondary | ICD-10-CM | POA: Diagnosis present

## 2017-06-04 DIAGNOSIS — J150 Pneumonia due to Klebsiella pneumoniae: Secondary | ICD-10-CM | POA: Diagnosis present

## 2017-06-04 DIAGNOSIS — Z4659 Encounter for fitting and adjustment of other gastrointestinal appliance and device: Secondary | ICD-10-CM

## 2017-06-04 DIAGNOSIS — T4275XA Adverse effect of unspecified antiepileptic and sedative-hypnotic drugs, initial encounter: Secondary | ICD-10-CM | POA: Diagnosis present

## 2017-06-04 DIAGNOSIS — Z6832 Body mass index (BMI) 32.0-32.9, adult: Secondary | ICD-10-CM

## 2017-06-04 DIAGNOSIS — Z515 Encounter for palliative care: Secondary | ICD-10-CM | POA: Diagnosis present

## 2017-06-04 DIAGNOSIS — Z978 Presence of other specified devices: Secondary | ICD-10-CM

## 2017-06-04 DIAGNOSIS — Z1611 Resistance to penicillins: Secondary | ICD-10-CM | POA: Diagnosis not present

## 2017-06-04 LAB — GLUCOSE, CAPILLARY
GLUCOSE-CAPILLARY: 350 mg/dL — AB (ref 65–99)
GLUCOSE-CAPILLARY: 298 mg/dL — AB (ref 65–99)
Glucose-Capillary: 272 mg/dL — ABNORMAL HIGH (ref 65–99)
Glucose-Capillary: 273 mg/dL — ABNORMAL HIGH (ref 65–99)

## 2017-06-04 LAB — CBC WITH DIFFERENTIAL/PLATELET
BASOS ABS: 0 10*3/uL (ref 0.0–0.1)
Basophils Relative: 0 %
EOS ABS: 0 10*3/uL (ref 0.0–0.7)
EOS PCT: 0 %
HCT: 38.9 % — ABNORMAL LOW (ref 39.0–52.0)
Hemoglobin: 12.1 g/dL — ABNORMAL LOW (ref 13.0–17.0)
Lymphocytes Relative: 5 %
Lymphs Abs: 1.1 10*3/uL (ref 0.7–4.0)
MCH: 24.2 pg — ABNORMAL LOW (ref 26.0–34.0)
MCHC: 31.1 g/dL (ref 30.0–36.0)
MCV: 77.8 fL — ABNORMAL LOW (ref 78.0–100.0)
MONO ABS: 1.8 10*3/uL — AB (ref 0.1–1.0)
Monocytes Relative: 9 %
Neutro Abs: 17.9 10*3/uL — ABNORMAL HIGH (ref 1.7–7.7)
Neutrophils Relative %: 86 %
PLATELETS: 125 10*3/uL — AB (ref 150–400)
RBC: 5 MIL/uL (ref 4.22–5.81)
RDW: 14.6 % (ref 11.5–15.5)
WBC: 20.9 10*3/uL — ABNORMAL HIGH (ref 4.0–10.5)

## 2017-06-04 LAB — I-STAT CHEM 8, ED
BUN: 26 mg/dL — ABNORMAL HIGH (ref 6–20)
CHLORIDE: 95 mmol/L — AB (ref 101–111)
CREATININE: 0.7 mg/dL (ref 0.61–1.24)
Calcium, Ion: 1.1 mmol/L — ABNORMAL LOW (ref 1.15–1.40)
Glucose, Bld: 380 mg/dL — ABNORMAL HIGH (ref 65–99)
HEMATOCRIT: 43 % (ref 39.0–52.0)
Hemoglobin: 14.6 g/dL (ref 13.0–17.0)
POTASSIUM: 5.5 mmol/L — AB (ref 3.5–5.1)
Sodium: 129 mmol/L — ABNORMAL LOW (ref 135–145)
TCO2: 24 mmol/L (ref 0–100)

## 2017-06-04 LAB — COMPREHENSIVE METABOLIC PANEL
ALBUMIN: 2.6 g/dL — AB (ref 3.5–5.0)
ALT: 55 U/L (ref 17–63)
ANION GAP: 10 (ref 5–15)
AST: 79 U/L — ABNORMAL HIGH (ref 15–41)
Alkaline Phosphatase: 65 U/L (ref 38–126)
BUN: 17 mg/dL (ref 6–20)
CHLORIDE: 103 mmol/L (ref 101–111)
CO2: 18 mmol/L — AB (ref 22–32)
Calcium: 7.8 mg/dL — ABNORMAL LOW (ref 8.9–10.3)
Creatinine, Ser: 1.07 mg/dL (ref 0.61–1.24)
GFR calc Af Amer: >60 mL/min (ref >60)
GFR calc non Af Amer: >60 mL/min (ref >60)
GLUCOSE: 303 mg/dL — AB (ref 65–99)
POTASSIUM: 4.2 mmol/L (ref 3.5–5.1)
SODIUM: 131 mmol/L — AB (ref 135–145)
Total Bilirubin: 4.6 mg/dL — ABNORMAL HIGH (ref 0.3–1.2)
Total Protein: 7.7 g/dL (ref 6.5–8.1)

## 2017-06-04 LAB — URINALYSIS, ROUTINE W REFLEX MICROSCOPIC
Bilirubin Urine: NEGATIVE
Ketones, ur: 20 mg/dL — AB
Nitrite: NEGATIVE
PH: 6 (ref 5.0–8.0)
PROTEIN: 100 mg/dL — AB
SPECIFIC GRAVITY, URINE: 1.027 (ref 1.005–1.030)

## 2017-06-04 LAB — CBG MONITORING, ED
GLUCOSE-CAPILLARY: 333 mg/dL — AB (ref 65–99)
Glucose-Capillary: 310 mg/dL — ABNORMAL HIGH (ref 65–99)

## 2017-06-04 LAB — I-STAT CG4 LACTIC ACID, ED
LACTIC ACID, VENOUS: 5.26 mmol/L — AB (ref 0.5–1.9)
LACTIC ACID, VENOUS: 3.66 mmol/L — AB (ref 0.5–1.9)

## 2017-06-04 LAB — LACTIC ACID, PLASMA: LACTIC ACID, VENOUS: 3.8 mmol/L — AB (ref 0.5–1.9)

## 2017-06-04 LAB — I-STAT TROPONIN, ED: Troponin i, poc: 0 ng/mL (ref 0.00–0.08)

## 2017-06-04 LAB — MRSA PCR SCREENING: MRSA by PCR: NEGATIVE

## 2017-06-04 MED ORDER — PANTOPRAZOLE SODIUM 40 MG IV SOLR
40.0000 mg | Freq: Every day | INTRAVENOUS | Status: DC
  Administered 2017-06-04 – 2017-06-09 (×6): 40 mg via INTRAVENOUS
  Filled 2017-06-04 (×6): qty 40

## 2017-06-04 MED ORDER — MORPHINE SULFATE (PF) 2 MG/ML IV SOLN
1.0000 mg | INTRAVENOUS | Status: DC | PRN
  Administered 2017-06-04 – 2017-06-05 (×2): 4 mg via INTRAVENOUS
  Administered 2017-06-05 – 2017-06-06 (×7): 2 mg via INTRAVENOUS
  Filled 2017-06-04: qty 1
  Filled 2017-06-04: qty 2
  Filled 2017-06-04 (×4): qty 1
  Filled 2017-06-04: qty 2
  Filled 2017-06-04 (×2): qty 1

## 2017-06-04 MED ORDER — FENTANYL CITRATE (PF) 100 MCG/2ML IJ SOLN
25.0000 ug | INTRAMUSCULAR | Status: DC | PRN
  Administered 2017-06-04 (×2): 100 ug via INTRAVENOUS
  Filled 2017-06-04 (×2): qty 2

## 2017-06-04 MED ORDER — SODIUM CHLORIDE 0.9 % IV SOLN: 250.0000 mL | INTRAVENOUS | Status: DC | PRN

## 2017-06-04 MED ORDER — INSULIN ASPART 100 UNIT/ML ~~LOC~~ SOLN
10.0000 [IU] | Freq: Once | SUBCUTANEOUS | Status: AC
  Administered 2017-06-04: 10 [IU] via INTRAVENOUS

## 2017-06-04 MED ORDER — SODIUM CHLORIDE 0.9 % IV BOLUS (SEPSIS)
500.0000 mL | Freq: Once | INTRAVENOUS | Status: AC
  Administered 2017-06-04: 500 mL via INTRAVENOUS

## 2017-06-04 MED ORDER — METHYLPREDNISOLONE SODIUM SUCC 125 MG IJ SOLR
60.0000 mg | Freq: Four times a day (QID) | INTRAMUSCULAR | Status: DC
  Administered 2017-06-04 – 2017-06-06 (×8): 60 mg via INTRAVENOUS
  Filled 2017-06-04 (×3): qty 0.96
  Filled 2017-06-04: qty 2
  Filled 2017-06-04: qty 0.96
  Filled 2017-06-04: qty 2
  Filled 2017-06-04 (×3): qty 0.96

## 2017-06-04 MED ORDER — SODIUM CHLORIDE 0.9 % IV SOLN
INTRAVENOUS | Status: DC
  Administered 2017-06-04: 100 mL/h via INTRAVENOUS
  Administered 2017-06-05: 18:00:00 via INTRAVENOUS
  Administered 2017-06-05: 1000 mL via INTRAVENOUS
  Administered 2017-06-06 (×2): via INTRAVENOUS
  Administered 2017-06-07: 75 mL/h via INTRAVENOUS
  Administered 2017-06-08 – 2017-06-11 (×3): via INTRAVENOUS

## 2017-06-04 MED ORDER — SODIUM CHLORIDE 0.9 % IV SOLN
3.0000 g | Freq: Four times a day (QID) | INTRAVENOUS | Status: DC
  Administered 2017-06-04 – 2017-06-09 (×19): 3 g via INTRAVENOUS
  Filled 2017-06-04 (×20): qty 3

## 2017-06-04 MED ORDER — SODIUM CHLORIDE 0.9 % IV BOLUS (SEPSIS)
1000.0000 mL | Freq: Once | INTRAVENOUS | Status: AC
  Administered 2017-06-04: 1000 mL via INTRAVENOUS

## 2017-06-04 MED ORDER — ACETAMINOPHEN 650 MG RE SUPP
650.0000 mg | Freq: Once | RECTAL | Status: AC
  Administered 2017-06-04: 650 mg via RECTAL
  Filled 2017-06-04: qty 1

## 2017-06-04 MED ORDER — IOPAMIDOL (ISOVUE-300) INJECTION 61%
INTRAVENOUS | Status: AC
  Administered 2017-06-04: 75 mL
  Filled 2017-06-04: qty 75

## 2017-06-04 MED ORDER — INSULIN ASPART 100 UNIT/ML ~~LOC~~ SOLN
0.0000 [IU] | SUBCUTANEOUS | Status: DC
  Administered 2017-06-04 – 2017-06-05 (×4): 11 [IU] via SUBCUTANEOUS
  Administered 2017-06-05 – 2017-06-06 (×8): 7 [IU] via SUBCUTANEOUS
  Administered 2017-06-07: 4 [IU] via SUBCUTANEOUS
  Administered 2017-06-07 (×3): 11 [IU] via SUBCUTANEOUS
  Administered 2017-06-07: 3 [IU] via SUBCUTANEOUS
  Administered 2017-06-07: 11 [IU] via SUBCUTANEOUS
  Administered 2017-06-08: 3 [IU] via SUBCUTANEOUS
  Administered 2017-06-08 (×3): 4 [IU] via SUBCUTANEOUS
  Administered 2017-06-08 (×2): 3 [IU] via SUBCUTANEOUS
  Administered 2017-06-09 (×4): 7 [IU] via SUBCUTANEOUS
  Administered 2017-06-09: 4 [IU] via SUBCUTANEOUS
  Administered 2017-06-10 (×3): 11 [IU] via SUBCUTANEOUS
  Administered 2017-06-10: 7 [IU] via SUBCUTANEOUS
  Administered 2017-06-10 – 2017-06-11 (×3): 11 [IU] via SUBCUTANEOUS
  Administered 2017-06-11 (×3): 7 [IU] via SUBCUTANEOUS
  Administered 2017-06-12: 4 [IU] via SUBCUTANEOUS
  Administered 2017-06-12: 3 [IU] via SUBCUTANEOUS
  Administered 2017-06-12 (×2): 4 [IU] via SUBCUTANEOUS
  Administered 2017-06-12: 11 [IU] via SUBCUTANEOUS
  Administered 2017-06-13: 1 [IU] via SUBCUTANEOUS
  Administered 2017-06-13 (×2): 7 [IU] via SUBCUTANEOUS
  Administered 2017-06-13: 11 [IU] via SUBCUTANEOUS
  Administered 2017-06-13 (×2): 7 [IU] via SUBCUTANEOUS
  Administered 2017-06-14: 4 [IU] via SUBCUTANEOUS
  Administered 2017-06-14: 3 [IU] via SUBCUTANEOUS

## 2017-06-04 MED ORDER — ORAL CARE MOUTH RINSE
15.0000 mL | Freq: Two times a day (BID) | OROMUCOSAL | Status: DC
  Administered 2017-06-04 – 2017-06-11 (×14): 15 mL via OROMUCOSAL

## 2017-06-04 MED ORDER — MORPHINE SULFATE (PF) 2 MG/ML IV SOLN
1.0000 mg | INTRAVENOUS | Status: DC | PRN
  Administered 2017-06-04: 2 mg via INTRAVENOUS
  Filled 2017-06-04: qty 1

## 2017-06-04 MED ORDER — CLINDAMYCIN PHOSPHATE 600 MG/50ML IV SOLN
600.0000 mg | Freq: Once | INTRAVENOUS | Status: AC
  Administered 2017-06-04: 600 mg via INTRAVENOUS
  Filled 2017-06-04: qty 50

## 2017-06-04 MED ORDER — CLINDAMYCIN PHOSPHATE 600 MG/50ML IV SOLN
600.0000 mg | Freq: Three times a day (TID) | INTRAVENOUS | Status: DC
  Administered 2017-06-04 – 2017-06-09 (×15): 600 mg via INTRAVENOUS
  Filled 2017-06-04 (×16): qty 50

## 2017-06-04 MED ORDER — INSULIN ASPART 100 UNIT/ML ~~LOC~~ SOLN
1.0000 [IU] | SUBCUTANEOUS | Status: DC
  Administered 2017-06-04 (×2): 3 [IU] via SUBCUTANEOUS
  Filled 2017-06-04 (×3): qty 1

## 2017-06-04 NOTE — ED Notes (Signed)
E l.ionk has called asking about a lactic acid supposed to have been drawn at 1400  Was not drawn  Will draw now

## 2017-06-04 NOTE — Progress Notes (Signed)
Pharmacy Antibiotic Note  [name] [name] is a 61 y.o. male admitted on 06/13/2017 with diffuse facial swelling and cellulitis, with concerns of R-sided parotitis. CT findings correlate with parotitis. WBC 20.9, LA 5.26, Tm 103.    Plan: -Unasyn 3 g IV q6h -Clindamycin 600 mg IV q8h -Monitor renal fx, cultures, duration of therapy   Height: 5' (152.4 cm) Weight: 140 lb (63.5 kg) IBW/kg (Calculated) : 50  Temp (24hrs), Avg:101 F (38.3 C), Min:99 F (37.2 C), Max:103 F (39.4 C)   Recent Labs Lab 06/02/2017 0939 06/14/2017 0957 06/11/2017 0958  WBC 20.9*  --   --   CREATININE  --  0.70  --   LATICACIDVEN  --   --  5.26*    Estimated Creatinine Clearance: 77.9 mL/min (by C-G formula based on SCr of 0.7 mg/dL).    No Known Allergies  Antimicrobials this admission: 6/18 Unasyn > 6/18 Clindamycin >   Microbiology results: 6/18 blood cx:   [name] [name], [name] [name] 06/11/2017 4:55 PM

## 2017-06-04 NOTE — ED Notes (Signed)
Critical lactic acid value of 3.66 given to RN

## 2017-06-04 NOTE — ED Triage Notes (Signed)
Pt reports R sided facial swelling since Saturday, worse over night. Pt reports sweling began as small bump on cheek, denies dental pain. Inner cheek swollen, preventing pt from closing mouth. Pt reports being unable to eat or take meds, reports [facility] and [facility]x,.

## 2017-06-04 NOTE — Progress Notes (Signed)
eLink Physician-Brief Progress Note Patient Name: [name] [name] DOB: 1956-04-14 MRN: [id number]   Date of Service  05-01-2017  HPI/Events of Note  Fever to 102.0 F. Patient has been pan cultured today and started on Clindamycin and Unasyn. AST = 79, therefore, can't use Tylenol. Creatinine = 1.07. Patient is NPO and has no gastric tube.   eICU Interventions  Will order: 1. Ice Packs PRN     Intervention Category Major Interventions: Infection - evaluation and management  Madylyn Insco Dennard Nipugene 05-01-2017, 11:46 PM

## 2017-06-04 NOTE — ED Notes (Signed)
Patient transported to CT 

## 2017-06-04 NOTE — ED Notes (Signed)
Called CT again to inquire when they will get patient.

## 2017-06-04 NOTE — ED Provider Notes (Signed)
[facility]-EMERGENCY DEPT Provider Note   CSN: [id number] Arrival date & time: 05/28/2017  0907     History   Chief Complaint Chief Complaint  Patient presents with  . Facial Swelling    HPI [name] [name] is a 61 y.o. male.  The history is provided by the patient. No language interpreter was used.   [name] [name] is a 61 y.o. male who presents to the Emergency Department complaining of facial swelling.  Level V caveat due to illness.  He presents accompanied by his family for facial swelling for the last 3 days. He reports pain began around the right preauricular region with initial focal swelling. He was seen at urgent care yesterday and started on antibiotics. He reports subjective fever at home with the difficulty swallowing and difficulty breathing. His swelling has significantly worsened since his visit yesterday. He did take his antibiotics yesterday but has been unable to take anything by mouth today. No prior similar symptoms. Past Medical History:  Diagnosis Date  . Diabetes mellitus without complication (HCC)   . Hepatitis C   . Hypertension     Patient Active Problem List   Diagnosis Date Noted  . Urinary retention   . 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 Hypomagnesemia 09/10/2015  . 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 Anemia of chronic disease 07/29/2014  . Absolute anemia 07/29/2014  . Hepatitis C antibody test positive 05/09/2012    Past Surgical History:  Procedure Laterality Date  . FLEXIBLE SIGMOIDOSCOPY N/A 09/10/2015   Procedure: FLEXIBLE SIGMOIDOSCOPY;  Surgeon: Jeani Hawking, MD;  Location: [facility];  Service: Endoscopy;  Laterality: N/A;       Home Medications    Prior to Admission medications   Medication Sig Start Date End Date Taking? Authorizing Provider  azithromycin (ZITHROMAX) 250 MG tablet Take 2 tabs PO x 1 dose, then 1 tab PO QD x 4 days 03/09/16   Elvina Sidle, MD  clindamycin (CLEOCIN) 150 MG capsule Take 1 capsule (150 mg total) by mouth every 6 (six)  hours. 06/03/17   Deatra Canter, FNP  dicyclomine (BENTYL) 20 MG tablet Take 1 tablet (20 mg total) by mouth 2 (two) times daily. Patient not taking: Reported on 03/09/2016 09/14/15   Barrett Henle, PA-C  docusate sodium (COLACE) 100 MG capsule Take 1 capsule (100 mg total) by mouth 2 (two) times daily. Patient not taking: Reported on 09/14/2015 09/12/15   Richarda Overlie, MD  glipiZIDE (GLUCOTROL) 5 MG tablet Take 1 tablet (5 mg total) by mouth daily before breakfast. 06/03/17   Deatra Canter, FNP  HYDROcodone-acetaminophen (NORCO/VICODIN) 5-325 MG tablet Take 1-2 tablets by mouth every 4 (four) hours as needed. 06/03/17   Deatra Canter, FNP  HYDROcodone-homatropine (HYCODAN) 5-1.5 MG/5ML syrup Take 5 mLs by mouth every 8 (eight) hours as needed for cough. 03/09/16   Elvina Sidle, MD  metFORMIN (GLUCOPHAGE) 500 MG tablet Take 1 tablet (500 mg total) by mouth 2 (two) times daily with a meal. 06/03/17   [place], Anselm Pancoast, FNP  omeprazole (PRILOSEC) 20 MG capsule Take 1 capsule (20 mg total) by mouth 2 (two) times daily before a meal. Patient not taking: Reported on 09/14/2015 04/05/15   Trena Platt D, PA  oxyCODONE (OXY IR/ROXICODONE) 5 MG immediate release tablet Take 1 tablet (5 mg total) by mouth every 6 (six) hours as needed for moderate pain. Patient not taking: Reported on 03/09/2016 09/12/15   Richarda Overlie, MD  polyethylene glycol (MIRALAX / GLYCOLAX) packet Take 17 g by mouth  daily as needed for mild constipation. Patient not taking: Reported on 03/09/2016 09/12/15   Richarda Overlie, MD  tamsulosin (FLOMAX) 0.4 MG CAPS capsule Take 1 capsule (0.4 mg total) by mouth daily. 03/09/16   Elvina Sidle, MD    Family History No family history on file.  Social History Social History  Substance Use Topics  . Smoking status: Former Smoker    Quit date: 05/09/1996  . Smokeless tobacco: Never Used  . Alcohol use 3.0 oz/week    5 Glasses of wine per week     Comment: social      Allergies   Patient has no known allergies.   Review of Systems Review of Systems  All other systems reviewed and are negative.    Physical Exam Updated Vital Signs BP (!) 169/130 (BP Location: Right Arm)   Pulse 77   Temp 99 F (37.2 C) (Oral)   Resp (!) 22   Ht 5' (1.524 m)   Wt 63.5 kg (140 lb)   SpO2 100%   BMI 27.34 kg/m   Physical Exam  Constitutional: He is oriented to person, place, and time. He appears well-developed and well-nourished. He appears distressed.  Ill appearing  HENT:  Head: Normocephalic.  Severe swelling of the right mandibular region that extends to the mid neck. Less than one finger trismus with significant swelling of the right buccal mucosa. Unable to visualize the posterior oropharynx.  Eyes: EOM are normal. Pupils are equal, round, and reactive to light.  Neck:  Swelling to the mid right anterior lateral neck.  Cardiovascular: Normal rate and regular rhythm.   No murmur heard. Pulmonary/Chest: Effort normal and breath sounds normal. No stridor. No respiratory distress.  Abdominal: Soft. There is no tenderness. There is no rebound and no guarding.  Musculoskeletal: He exhibits no edema or tenderness.  Neurological: He is alert and oriented to person, place, and time.  Skin: Skin is warm and dry.  Psychiatric: He has a normal mood and affect. His behavior is normal.  Nursing note and vitals reviewed.        ED Treatments / Results  Labs (all labs ordered are listed, but only abnormal results are displayed) Labs Reviewed  CULTURE, BLOOD (ROUTINE X 2)  CULTURE, BLOOD (ROUTINE X 2)  COMPREHENSIVE METABOLIC PANEL  CBC WITH DIFFERENTIAL/PLATELET  URINALYSIS, ROUTINE W REFLEX MICROSCOPIC  PROTIME-INR  I-STAT CG4 LACTIC ACID, ED  I-STAT CG4 LACTIC ACID, ED    EKG  EKG Interpretation None       Radiology No results found.  Procedures Procedures (including critical care time) CRITICAL CARE Performed by: Tilden Fossa   Total critical care time: 35 minutes  Critical care time was exclusive of separately billable procedures and treating other patients.  Critical care was necessary to treat or prevent imminent or life-threatening deterioration.  Critical care was time spent personally by me on the following activities: development of treatment plan with patient and/or surrogate as well as nursing, discussions with consultants, evaluation of patient's response to treatment, examination of patient, obtaining history from patient or surrogate, ordering and performing treatments and interventions, ordering and review of laboratory studies, ordering and review of radiographic studies, pulse oximetry and re-evaluation of patient's condition.  Medications Ordered in ED Medications  clindamycin (CLEOCIN) IVPB 600 mg (not administered)     Initial Impression / Assessment and Plan / ED Course  I have reviewed the triage vital signs and the nursing notes.  Pertinent labs & imaging results that were  available during my care of the patient were reviewed by me and considered in my medical decision making (see chart for details).     Patient here for evaluation of facial swelling. He is ill-appearing on examination with significant mandibular swelling. He is unable to open or close his mouth secondary to swelling. Concern for patient's risk for airway compromise given the swelling and his inability to swallow secretions well. He was started on IV fluids and broad-spectrum antibiotics. Consult to ENT due to concern for patient's risk for potential decompensation.  Imaging obtained with no evidence of abscess but does demonstrate significant parotitis. Critical care consultation for admission given patient's severe sepsis and concern for risk of compromise with his significant swelling.  Final Clinical Impressions(s) / ED Diagnoses   Final diagnoses:  Parotitis, acute    New Prescriptions New Prescriptions    No medications on file     Tilden Fossaees, Jo Cerone, MD 06/05/17 1625

## 2017-06-04 NOTE — ED Notes (Signed)
EDP made aware of patient's oral temp of 103.0 F

## 2017-06-04 NOTE — Progress Notes (Signed)
Pt's CBG 350. Dr. Kendrick FriesMcQuaid made aware. Awaiting orders.

## 2017-06-04 NOTE — ED Notes (Signed)
Checked patient blood sugar it was 310 notified RN Stacy of blood sugar

## 2017-06-04 NOTE — ED Notes (Signed)
Report given to rn on 2m 

## 2017-06-04 NOTE — Progress Notes (Signed)
eLink Physician-Brief Progress Note Patient Name: [name] [name] DOB: 27-Apr-1956 MRN: [id number]   Date of Service  06/02/2017  HPI/Events of Note  hyperglycemia  eICU Interventions  IV insuline 10 units now and 500cc saline bolus now, then repeat glucose and follow SSI as ordered by admitting MD     Intervention Category Major Interventions: Hyperglycemia - active titration of insulin therapy  Max FickleDouglas Clinton Wahlberg 06/12/2017, 6:46 PM

## 2017-06-04 NOTE — H&P (Signed)
PULMONARY / CRITICAL CARE MEDICINE   Name: [name] [name] MRN: [id number] DOB: 1956-08-11    ADMISSION DATE:  06/11/2017 CONSULTATION DATE:  06/05/2017  REFERRING MD:  Dr. Pecola Leisure EDP  CHIEF COMPLAINT:  Facial swelling  HISTORY OF PRESENT ILLNESS:   61 year old male with PMH as below, which is significant for DM, HTN, and Hepatitis C. He was in his usual state of health until Friday 6/15 when he noticed some minor R sided facial swelling. This continued to worsen in the following days and he developed associated fevers/chills, dyspnea, and dysphagia. He presented to urgent care 6/17 and was prescribed Clindamycin. He took his first dose, but was unable to take any subsequent doses due to dysphagia. He presented to [facility] ED 6/18 for this complaint. He had CT scan done which demonstrated right parotitis and facial cellulitis. Due to concern for airway compromise, PCCM asked to evaluate for admission.   PAST MEDICAL HISTORY :  He  has a past medical history of Diabetes mellitus without complication (HCC); Hepatitis C; and Hypertension.  PAST SURGICAL HISTORY: He  has a past surgical history that includes Flexible sigmoidoscopy (N/A, 09/10/2015).  No Known Allergies  No current facility-administered medications on file prior to encounter.    Current Outpatient Prescriptions on File Prior to Encounter  Medication Sig  . azithromycin (ZITHROMAX) 250 MG tablet Take 2 tabs PO x 1 dose, then 1 tab PO QD x 4 days  . clindamycin (CLEOCIN) 150 MG capsule Take 1 capsule (150 mg total) by mouth every 6 (six) hours.  Marland Kitchen dicyclomine (BENTYL) 20 MG tablet Take 1 tablet (20 mg total) by mouth 2 (two) times daily. (Patient not taking: Reported on 03/09/2016)  . docusate sodium (COLACE) 100 MG capsule Take 1 capsule (100 mg total) by mouth 2 (two) times daily. (Patient not taking: Reported on 09/14/2015)  . glipiZIDE (GLUCOTROL) 5 MG tablet Take 1 tablet (5 mg total) by mouth daily before breakfast.  .  HYDROcodone-acetaminophen (NORCO/VICODIN) 5-325 MG tablet Take 1-2 tablets by mouth every 4 (four) hours as needed.  Marland Kitchen HYDROcodone-homatropine (HYCODAN) 5-1.5 MG/5ML syrup Take 5 mLs by mouth every 8 (eight) hours as needed for cough.  . metFORMIN (GLUCOPHAGE) 500 MG tablet Take 1 tablet (500 mg total) by mouth 2 (two) times daily with a meal.  . omeprazole (PRILOSEC) 20 MG capsule Take 1 capsule (20 mg total) by mouth 2 (two) times daily before a meal. (Patient not taking: Reported on 09/14/2015)  . oxyCODONE (OXY IR/ROXICODONE) 5 MG immediate release tablet Take 1 tablet (5 mg total) by mouth every 6 (six) hours as needed for moderate pain. (Patient not taking: Reported on 03/09/2016)  . polyethylene glycol (MIRALAX / GLYCOLAX) packet Take 17 g by mouth daily as needed for mild constipation. (Patient not taking: Reported on 03/09/2016)  . tamsulosin (FLOMAX) 0.4 MG CAPS capsule Take 1 capsule (0.4 mg total) by mouth daily.    FAMILY HISTORY:  His indicated that his mother is deceased. He indicated that his father is deceased.    SOCIAL HISTORY: He  reports that he quit smoking about 21 years ago. He has never used smokeless tobacco. He reports that he drinks about 3.0 oz of alcohol per week . He reports that he does not use drugs.  REVIEW OF SYSTEMS:   Bolds are positive  Constitutional: weight loss, gain, night sweats, Fevers, chills, fatigue .  HEENT: headaches, Sore throat, sneezing, nasal congestion, post nasal drip, Difficulty swallowing, Tooth/dental problems, visual  complaints visual changes, ear ache CV:  chest pain, radiates:,Orthopnea, PND, swelling in lower extremities, dizziness, palpitations, syncope.  GI  heartburn, indigestion, abdominal pain, nausea, vomiting, diarrhea, change in bowel habits, loss of appetite, bloody stools.  Resp: cough, productive:, hemoptysis, dyspnea, chest pain, pleuritic.  Skin: rash or itching or icterus GU: dysuria, change in color of urine, urgency or  frequency. flank pain, hematuria  MS: joint pain or swelling. decreased range of motion  Psych: change in mood or affect. depression or anxiety.  Neuro: difficulty with speech, weakness, numbness, ataxia    SUBJECTIVE:   VITAL SIGNS: BP (!) 159/77   Pulse 90   Temp 99 F (37.2 C) (Oral)   Resp (!) 23   Ht 5' (1.524 m)   Wt 63.5 kg (140 lb)   SpO2 100%   BMI 27.34 kg/m   HEMODYNAMICS:    VENTILATOR SETTINGS:    INTAKE / OUTPUT: No intake/output data recorded.  PHYSICAL EXAMINATION: General:  Adult male lying in bed in mild distress r/t facial pain Neuro:  Awake, alert, oriented per family HEENT:  Significant edema and redness to R side of face. PERRL, no JVD Cardiovascular:  RRR, no MRG Lungs:  Clear Abdomen:  Soft, non-tender, non-distended Musculoskeletal:  No acute deformity or ROM limitation Skin:  Grossly intact  LABS:  BMET  Recent Labs Lab 06/07/2017 0957  NA 129*  K 5.5*  CL 95*  BUN 26*  CREATININE 0.70  GLUCOSE 380*    Electrolytes No results for input(s): CALCIUM, MG, PHOS in the last 168 hours.  CBC  Recent Labs Lab 06/02/2017 0939 05/19/2017 0957  WBC 20.9*  --   HGB 12.1* 14.6  HCT 38.9* 43.0  PLT 125*  --     Coag's No results for input(s): APTT, INR in the last 168 hours.  Sepsis Markers  Recent Labs Lab 06/01/2017 0958  LATICACIDVEN 5.26*    ABG No results for input(s): PHART, PCO2ART, PO2ART in the last 168 hours.  Liver Enzymes No results for input(s): AST, ALT, ALKPHOS, BILITOT, ALBUMIN in the last 168 hours.  Cardiac Enzymes No results for input(s): TROPONINI, PROBNP in the last 168 hours.  Glucose No results for input(s): GLUCAP in the last 168 hours.  Imaging Ct Soft Tissue Neck W Contrast  Result Date: 06/01/2017 CLINICAL DATA:  RIGHT-sided facial swelling. EXAM: CT NECK WITH CONTRAST TECHNIQUE: Multidetector CT imaging of the neck was performed using the standard protocol following the bolus administration  of intravenous contrast. CONTRAST:  75mL ISOVUE-300 IOPAMIDOL (ISOVUE-300) INJECTION 61% COMPARISON:  None. FINDINGS: Pharynx and larynx: Mild extrinsic mass effect on the pharynx and airway, due to RIGHT-sided inflammation Salivary glands: Diffusely enlarged RIGHT parotid gland, marked surrounding edema, extending downward over the RIGHT face. The RIGHT masseter muscle is enlarged. LEFT parotid gland is normal. LEFT and RIGHT submandibular glands do not show inflammation although the RIGHT submandibular gland is surrounded by fluid. No calculi. Thyroid: Normal. Lymph nodes: Reactive lymph nodes on the RIGHT, not pathologically enlarged. Vascular: Unremarkable. Limited intracranial: Negative. Visualized orbits: Negative. Mastoids and visualized paranasal sinuses: Minimal ethmoid sinus fluid. Skeleton: Unremarkable. Relatively minor dental caries and periapical lucencies, out of proportion to the degree of inflammation. Upper chest: No acute findings. Other: None. IMPRESSION: Diffuse facial swelling favored to represent bacterial RIGHT parotitis. Significant facial cellulitis, with RIGHT muscle of mastication enlargement. No visible calculi. No drainable abscess. Minor dental caries and periapical lucencies, not felt to be the source of inflammation. Findings discussed with  ordering provider Electronically Signed   By: Elsie Stain M.D.   On: 06/15/2017 11:48    STUDIES:  CT soft tissue neck 6/18 > Diffuse facial swelling favored to represent bacterial RIGHT parotitis. Significant facial cellulitis, with RIGHT muscle of mastication enlargement. No visible calculi. No drainable abscess. Minor dental caries and periapical lucencies, not felt to be the source of inflammation.  CULTURES: Blood 6/18 >  ANTIBIOTICS: Clindamycin 6/18 > Unasyn 6/18 >  SIGNIFICANT EVENTS: 6/18 admit to ICU for R parotitis  LINES/TUBES:   DISCUSSION: 61 year old male admitted with R Parotitis and concern for airway  compromise. Admitted to ICU antibiotics and close monitoring.   ASSESSMENT / PLAN:  Right Parotitis: There is potential for airway compromise, currently seems to be protecting. No evidence of airway compression on CT.  - Close monitoring in ICU setting overnight - ABX as above, follow cultures.  - Solumedrol 60mg  every 6 hours - ENT aware of patient and will see if clinically deteriorates.  - Low dose fentanyl for pain management.   Elevated lactic acid: s/p 30cc/kg bolus in ED - Continue maintenance fluid NS 12mL/Hr - Repeat lactic acid, ensure clearing  Hypertension - monitor, not on home medications.  DM - Holding home metformin, glipizide while NPO - CBG monitoring and SSI   Hepatitis C - LFTs  Hyponatremia Hyperkalemia both on iSTAT - Repeat formal BMP  Best practice: -NPO -SCDs -Protonix  Dispo: -ICU  Wife and son updated bedside in ED  Joneen Roach, AGACNP-BC [place] [facility] Pager [phone] or [phone]  05/20/2017 1:27 PM

## 2017-06-04 NOTE — ED Notes (Signed)
Called CT and informed there is a critical pt that needs to be scanned STAT per Dr. Madilyn Hookees.

## 2017-06-04 NOTE — ED Notes (Signed)
The pt is alert with extreme swelling to the rt side of his face

## 2017-06-05 DIAGNOSIS — J988 Other specified respiratory disorders: Secondary | ICD-10-CM

## 2017-06-05 DIAGNOSIS — A419 Sepsis, unspecified organism: Principal | ICD-10-CM

## 2017-06-05 DIAGNOSIS — R0902 Hypoxemia: Secondary | ICD-10-CM

## 2017-06-05 LAB — LACTIC ACID, PLASMA: LACTIC ACID, VENOUS: 2.2 mmol/L — AB (ref 0.5–1.9)

## 2017-06-05 LAB — GLUCOSE, CAPILLARY
GLUCOSE-CAPILLARY: 240 mg/dL — AB (ref 65–99)
Glucose-Capillary: 274 mg/dL — ABNORMAL HIGH (ref 65–99)
Glucose-Capillary: 282 mg/dL — ABNORMAL HIGH (ref 65–99)
Glucose-Capillary: 236 mg/dL — ABNORMAL HIGH (ref 65–99)
Glucose-Capillary: 252 mg/dL — ABNORMAL HIGH (ref 65–99)

## 2017-06-05 LAB — BASIC METABOLIC PANEL
ANION GAP: 9 (ref 5–15)
BUN: 19 mg/dL (ref 6–20)
CALCIUM: 7.7 mg/dL — AB (ref 8.9–10.3)
CO2: 17 mmol/L — ABNORMAL LOW (ref 22–32)
Chloride: 109 mmol/L (ref 101–111)
Creatinine, Ser: 0.96 mg/dL (ref 0.61–1.24)
GFR calc non Af Amer: >60 mL/min (ref >60)
GLUCOSE: 267 mg/dL — AB (ref 65–99)
Potassium: 4.1 mmol/L (ref 3.5–5.1)
Sodium: 135 mmol/L (ref 135–145)

## 2017-06-05 LAB — CBC
HCT: 32.5 % — ABNORMAL LOW (ref 39.0–52.0)
HEMOGLOBIN: 10 g/dL — AB (ref 13.0–17.0)
MCH: 23.8 pg — ABNORMAL LOW (ref 26.0–34.0)
MCHC: 30.8 g/dL (ref 30.0–36.0)
MCV: 77.2 fL — ABNORMAL LOW (ref 78.0–100.0)
PLATELETS: 106 10*3/uL — AB (ref 150–400)
RBC: 4.21 MIL/uL — ABNORMAL LOW (ref 4.22–5.81)
RDW: 14.4 % (ref 11.5–15.5)
WBC: 14.8 10*3/uL — ABNORMAL HIGH (ref 4.0–10.5)

## 2017-06-05 LAB — HIV ANTIBODY (ROUTINE TESTING W REFLEX): HIV SCREEN 4TH GENERATION: NONREACTIVE

## 2017-06-05 MED ORDER — INSULIN GLARGINE 100 UNIT/ML ~~LOC~~ SOLN
10.0000 [IU] | Freq: Every day | SUBCUTANEOUS | Status: AC
  Administered 2017-06-05 – 2017-06-06 (×2): 10 [IU] via SUBCUTANEOUS
  Filled 2017-06-05 (×2): qty 0.1

## 2017-06-05 NOTE — Progress Notes (Signed)
Pt having difficulty speaking due to facial swelling. Wife speaks very broken [place]. I attempted to call [employer] but no interpreter was available for the specific dialects that the patient and his family speaks. I was able to answer some questions for the family and it seemed that there was mutual understanding at this time.

## 2017-06-05 NOTE — Consult Note (Signed)
Reason for Consult: Parotitis Referring Physician: CCM  [name] [name] is an 61 y.o. male.  HPI: 61 year old male with diabetes, hypertension, and hepatitis C developed swelling of the right face starting 6/15 that worsened day by day.  He developed fever, chills, dyspnea, and dysphagia.  He went to urgent care 6/17 and was started on oral clindamycin but was only able to take one pill before he lost the ability to swallow.  He came to the ER yesterday and was admitted on IV clindamycin and Unasyn and observed in the ICU overnight after a CT scan demonstrated parotitis and no drainable abscess.  He did well overnight with no breathing difficulty.  He feels that swelling is worse today.  He is unable to open the right eye.  Past Medical History:  Diagnosis Date  . Diabetes mellitus without complication ([place])   . Hepatitis C   . Hypertension     Past Surgical History:  Procedure Laterality Date  . FLEXIBLE SIGMOIDOSCOPY N/A 09/10/2015   Procedure: FLEXIBLE SIGMOIDOSCOPY;  Surgeon: Carol Ada, MD;  Location: [facility];  Service: Endoscopy;  Laterality: N/A;    No family history on file.  Social History:  reports that he quit smoking about 21 years ago. He has never used smokeless tobacco. He reports that he drinks about 3.0 oz of alcohol per week . He reports that he does not use drugs.  Allergies: No Known Allergies  Medications: I have reviewed the patient's current medications.  Results for orders placed or performed during the hospital encounter of 06/15/2017 (from the past 48 hour(s))  Blood Culture (routine x 2)     Status: None (Preliminary result)   Collection Time: 06/03/2017  9:38 AM  Result Value Ref Range   Specimen Description BLOOD RIGHT HAND    Special Requests      BOTTLES DRAWN AEROBIC AND ANAEROBIC Blood Culture adequate volume   Culture NO GROWTH 1 DAY    Report Status PENDING   CBC with Differential     Status: Abnormal   Collection Time: 06/03/2017  9:39 AM  Result  Value Ref Range   WBC 20.9 (H) 4.0 - 10.5 K/uL   RBC 5.00 4.22 - 5.81 MIL/uL   Hemoglobin 12.1 (L) 13.0 - 17.0 g/dL   HCT 38.9 (L) 39.0 - 52.0 %   MCV 77.8 (L) 78.0 - 100.0 fL   MCH 24.2 (L) 26.0 - 34.0 pg   MCHC 31.1 30.0 - 36.0 g/dL   RDW 14.6 11.5 - 15.5 %   Platelets 125 (L) 150 - 400 K/uL   Neutrophils Relative % 86 %   Neutro Abs 17.9 (H) 1.7 - 7.7 K/uL   Lymphocytes Relative 5 %   Lymphs Abs 1.1 0.7 - 4.0 K/uL   Monocytes Relative 9 %   Monocytes Absolute 1.8 (H) 0.1 - 1.0 K/uL   Eosinophils Relative 0 %   Eosinophils Absolute 0.0 0.0 - 0.7 K/uL   Basophils Relative 0 %   Basophils Absolute 0.0 0.0 - 0.1 K/uL  Blood Culture (routine x 2)     Status: None (Preliminary result)   Collection Time: 05/26/2017  9:39 AM  Result Value Ref Range   Specimen Description BLOOD LEFT ANTECUBITAL    Special Requests      BOTTLES DRAWN AEROBIC AND ANAEROBIC Blood Culture adequate volume   Culture NO GROWTH 1 DAY    Report Status PENDING   I-Stat Chem 8, ED     Status: Abnormal  Collection Time: 05/24/2017  9:57 AM  Result Value Ref Range   Sodium 129 (L) 135 - 145 mmol/L   Potassium 5.5 (H) 3.5 - 5.1 mmol/L   Chloride 95 (L) 101 - 111 mmol/L   BUN 26 (H) 6 - 20 mg/dL   Creatinine, Ser 0.70 0.61 - 1.24 mg/dL   Glucose, Bld 380 (H) 65 - 99 mg/dL   Calcium, Ion 1.10 (L) 1.15 - 1.40 mmol/L   TCO2 24 0 - 100 mmol/L   Hemoglobin 14.6 13.0 - 17.0 g/dL   HCT 43.0 39.0 - 52.0 %  I-Stat CG4 Lactic Acid, ED     Status: Abnormal   Collection Time: 06/01/2017  9:58 AM  Result Value Ref Range   Lactic Acid, Venous 5.26 (HH) 0.5 - 1.9 mmol/L   Comment NOTIFIED PHYSICIAN   I-Stat Troponin, ED (not at [facility])     Status: None   Collection Time: 06/01/2017  9:58 AM  Result Value Ref Range   Troponin i, poc 0.00 0.00 - 0.08 ng/mL   Comment 3            Comment: Due to the release kinetics of cTnI, a negative result within the first hours of the onset of symptoms does not rule out myocardial  infarction with certainty. If myocardial infarction is still suspected, repeat the test at appropriate intervals.   Urinalysis, Routine w reflex microscopic     Status: Abnormal   Collection Time: 06/10/2017 11:59 AM  Result Value Ref Range   Color, Urine YELLOW YELLOW   APPearance CLEAR CLEAR   Specific Gravity, Urine 1.027 1.005 - 1.030   pH 6.0 5.0 - 8.0   Glucose, UA >=500 (A) NEGATIVE mg/dL   Hgb urine dipstick SMALL (A) NEGATIVE   Bilirubin Urine NEGATIVE NEGATIVE   Ketones, ur 20 (A) NEGATIVE mg/dL   Protein, ur 100 (A) NEGATIVE mg/dL   Nitrite NEGATIVE NEGATIVE   Leukocytes, UA LARGE (A) NEGATIVE   RBC / HPF 6-30 0 - 5 RBC/hpf   WBC, UA 6-30 0 - 5 WBC/hpf   Bacteria, UA RARE (A) NONE SEEN   Squamous Epithelial / LPF 0-5 (A) NONE SEEN  CBG monitoring, ED     Status: Abnormal   Collection Time: 06/03/2017  1:35 PM  Result Value Ref Range   Glucose-Capillary 310 (H) 65 - 99 mg/dL  CBG monitoring, ED     Status: Abnormal   Collection Time: 05/22/2017  4:30 PM  Result Value Ref Range   Glucose-Capillary 333 (H) 65 - 99 mg/dL  I-Stat CG4 Lactic Acid, ED  (not at  [facility])     Status: Abnormal   Collection Time: 05/18/2017  5:07 PM  Result Value Ref Range   Lactic Acid, Venous 3.66 (HH) 0.5 - 1.9 mmol/L   Comment NOTIFIED PHYSICIAN   MRSA PCR Screening     Status: None   Collection Time: 06/10/2017  6:01 PM  Result Value Ref Range   MRSA by PCR NEGATIVE NEGATIVE    Comment:        The GeneXpert MRSA Assay (FDA approved for NASAL specimens only), is one component of a comprehensive MRSA colonization surveillance program. It is not intended to diagnose MRSA infection nor to guide or monitor treatment for MRSA infections.   Glucose, capillary     Status: Abnormal   Collection Time: 05/18/2017  6:10 PM  Result Value Ref Range   Glucose-Capillary 350 (H) 65 - 99 mg/dL   Comment 1 Notify RN  HIV antibody (Routine Testing)     Status: None   Collection Time: 06/01/2017  7:40 PM   Result Value Ref Range   HIV Screen 4th Generation wRfx Non Reactive Non Reactive    Comment: (NOTE) Performed At: [facility] [place], [place] [id number] Lindon Romp MD IO:[id number]   Lactic acid, plasma     Status: Abnormal   Collection Time: 05/18/2017  7:40 PM  Result Value Ref Range   Lactic Acid, Venous 3.8 (HH) 0.5 - 1.9 mmol/L    Comment: CRITICAL RESULT CALLED TO, READ BACK BY AND VERIFIED WITH: MAYERS,R RN 05/30/2017 2125 JORDANS   Comprehensive metabolic panel     Status: Abnormal   Collection Time: 06/03/2017  7:40 PM  Result Value Ref Range   Sodium 131 (L) 135 - 145 mmol/L   Potassium 4.2 3.5 - 5.1 mmol/L    Comment: DELTA CHECK NOTED   Chloride 103 101 - 111 mmol/L   CO2 18 (L) 22 - 32 mmol/L   Glucose, Bld 303 (H) 65 - 99 mg/dL   BUN 17 6 - 20 mg/dL   Creatinine, Ser 1.07 0.61 - 1.24 mg/dL   Calcium 7.8 (L) 8.9 - 10.3 mg/dL   Total Protein 7.7 6.5 - 8.1 g/dL   Albumin 2.6 (L) 3.5 - 5.0 g/dL   AST 79 (H) 15 - 41 U/L   ALT 55 17 - 63 U/L   Alkaline Phosphatase 65 38 - 126 U/L   Total Bilirubin 4.6 (H) 0.3 - 1.2 mg/dL   GFR calc non Af Amer >60 >60 mL/min   GFR calc Af Amer >60 >60 mL/min    Comment: (NOTE) The eGFR has been calculated using the CKD EPI equation. This calculation has not been validated in all clinical situations. eGFR's persistently <60 mL/min signify possible Chronic Kidney Disease.    Anion gap 10 5 - 15  Glucose, capillary     Status: Abnormal   Collection Time: 06/10/2017  7:43 PM  Result Value Ref Range   Glucose-Capillary 298 (H) 65 - 99 mg/dL   Comment 1 Capillary Specimen    Comment 2 Notify RN   Glucose, capillary     Status: Abnormal   Collection Time: 05/19/2017  9:52 PM  Result Value Ref Range   Glucose-Capillary 273 (H) 65 - 99 mg/dL   Comment 1 Notify RN   Glucose, capillary     Status: Abnormal   Collection Time: 06/08/2017 11:32 PM  Result Value Ref Range   Glucose-Capillary 272 (H) 65 - 99  mg/dL   Comment 1 Capillary Specimen    Comment 2 Notify RN   Glucose, capillary     Status: Abnormal   Collection Time: 06/05/17  3:28 AM  Result Value Ref Range   Glucose-Capillary 274 (H) 65 - 99 mg/dL   Comment 1 Capillary Specimen    Comment 2 Notify RN   Glucose, capillary     Status: Abnormal   Collection Time: 06/05/17  7:35 AM  Result Value Ref Range   Glucose-Capillary 282 (H) 65 - 99 mg/dL   Comment 1 Capillary Specimen   Basic metabolic panel     Status: Abnormal   Collection Time: 06/05/17  9:25 AM  Result Value Ref Range   Sodium 135 135 - 145 mmol/L   Potassium 4.1 3.5 - 5.1 mmol/L   Chloride 109 101 - 111 mmol/L   CO2 17 (L) 22 - 32 mmol/L   Glucose, Bld 267 (H)  65 - 99 mg/dL   BUN 19 6 - 20 mg/dL   Creatinine, Ser 0.96 0.61 - 1.24 mg/dL   Calcium 7.7 (L) 8.9 - 10.3 mg/dL   GFR calc non Af Amer >60 >60 mL/min   GFR calc Af Amer >60 >60 mL/min    Comment: (NOTE) The eGFR has been calculated using the CKD EPI equation. This calculation has not been validated in all clinical situations. eGFR's persistently <60 mL/min signify possible Chronic Kidney Disease.    Anion gap 9 5 - 15  CBC     Status: Abnormal   Collection Time: 06/05/17  9:25 AM  Result Value Ref Range   WBC 14.8 (H) 4.0 - 10.5 K/uL   RBC 4.21 (L) 4.22 - 5.81 MIL/uL   Hemoglobin 10.0 (L) 13.0 - 17.0 g/dL   HCT 32.5 (L) 39.0 - 52.0 %   MCV 77.2 (L) 78.0 - 100.0 fL   MCH 23.8 (L) 26.0 - 34.0 pg   MCHC 30.8 30.0 - 36.0 g/dL   RDW 14.4 11.5 - 15.5 %   Platelets 106 (L) 150 - 400 K/uL    Comment: PLATELET COUNT CONFIRMED BY SMEAR  Lactic acid, plasma     Status: Abnormal   Collection Time: 06/05/17 11:26 AM  Result Value Ref Range   Lactic Acid, Venous 2.2 (HH) 0.5 - 1.9 mmol/L    Comment: CRITICAL RESULT CALLED TO, READ BACK BY AND VERIFIED WITH: M.TOLER,RN 06/05/17 1234 BY BSLADE   Glucose, capillary     Status: Abnormal   Collection Time: 06/05/17 11:43 AM  Result Value Ref Range    Glucose-Capillary 236 (H) 65 - 99 mg/dL   Comment 1 Capillary Specimen   Glucose, capillary     Status: Abnormal   Collection Time: 06/05/17  4:22 PM  Result Value Ref Range   Glucose-Capillary 252 (H) 65 - 99 mg/dL   Comment 1 Capillary Specimen     Ct Soft Tissue Neck W Contrast  Result Date: 06/16/2017 CLINICAL DATA:  RIGHT-sided facial swelling. EXAM: CT NECK WITH CONTRAST TECHNIQUE: Multidetector CT imaging of the neck was performed using the standard protocol following the bolus administration of intravenous contrast. CONTRAST:  52m ISOVUE-300 IOPAMIDOL (ISOVUE-300) INJECTION 61% COMPARISON:  None. FINDINGS: Pharynx and larynx: Mild extrinsic mass effect on the pharynx and airway, due to RIGHT-sided inflammation Salivary glands: Diffusely enlarged RIGHT parotid gland, marked surrounding edema, extending downward over the RIGHT face. The RIGHT masseter muscle is enlarged. LEFT parotid gland is normal. LEFT and RIGHT submandibular glands do not show inflammation although the RIGHT submandibular gland is surrounded by fluid. No calculi. Thyroid: Normal. Lymph nodes: Reactive lymph nodes on the RIGHT, not pathologically enlarged. Vascular: Unremarkable. Limited intracranial: Negative. Visualized orbits: Negative. Mastoids and visualized paranasal sinuses: Minimal ethmoid sinus fluid. Skeleton: Unremarkable. Relatively minor dental caries and periapical lucencies, out of proportion to the degree of inflammation. Upper chest: No acute findings. Other: None. IMPRESSION: Diffuse facial swelling favored to represent bacterial RIGHT parotitis. Significant facial cellulitis, with RIGHT muscle of mastication enlargement. No visible calculi. No drainable abscess. Minor dental caries and periapical lucencies, not felt to be the source of inflammation. Findings discussed with ordering provider Electronically Signed   By: JStaci RighterM.D.   On: 05/18/2017 11:48    Review of Systems  Constitutional: Positive  for chills and fever.  Gastrointestinal:       Dysphagia  All other systems reviewed and are negative.  Blood pressure 128/71, pulse 76, temperature 97.8  F (36.6 C), temperature source Oral, resp. rate (!) 27, height 5' (1.524 m), weight 140 lb 10.5 oz (63.8 kg), SpO2 98 %. Physical Exam  Constitutional: He is oriented to person, place, and time. He appears well-developed and well-nourished. No distress.  HENT:  Right Ear: External ear normal.  Left Ear: External ear normal.  Nose: Nose normal.  Mouth/Throat: Oropharynx is clear and moist.  Marked right facial and upper neck firm, pitting edema.  Tender over the parotid gland.  Right eyelids swollen closed.  Unable to close mouth due to cheek edema.  Edema extends into submental neck with some tenderness.  No pus expressible from parotid duct but opening difficult to see.  Ulcerated mucosa inside right lower lip.  Eyes: Conjunctivae and EOM are normal. Pupils are equal, round, and reactive to light.  Right eye difficult to assess.  Neck: Normal range of motion. Neck supple.  Cardiovascular: Normal rate.   Respiratory: Effort normal.  Musculoskeletal: Normal range of motion.  Neurological: He is alert and oriented to person, place, and time. No cranial nerve deficit.  Skin: Skin is warm and dry.  Psychiatric: He has a normal mood and affect. His behavior is normal. Judgment and thought content normal.    Assessment/Plan: Right acute parotitis with extensive facial cellulitis I personally reviewed his neck CT demonstrating inflammatory changes involving the parotid gland and surrounding soft tissues but no fluid collection or obstructing stone.  I agree with management with IV antibiotics and the choice of Unasyn and clindamycin should be sufficient.  I will have nursing provide hot packs to apply to the face.  He should be well-hydrated and should start oral liquids when able.  Eventually, he would benefit from sucking on sour candy to  promote saliva flow.  I instructed him to massage the area posterior to anterior frequently.  There is no role for surgical management at this time.  Will follow.  If there is no improvement over the next two days, would repeat CT scan.  Cassia Fein 06/05/2017, 5:35 PM

## 2017-06-05 NOTE — Care Management Note (Signed)
Case Management Note  Patient Details  Name: [name] FranklinHlec Hemminger MRN: [id number] Date of Birth: 18-Jul-1956  Subjective/Objective:        Pt admitted with facial swelling - recently prescribed antibiotic outpt.             Action/Plan:   PTA independent from home with wife.   Expected Discharge Date:                  Expected Discharge Plan:  Home/Self Care  In-House Referral:     Discharge planning Services  CM Consult  Post Acute Care Choice:    Choice offered to:     DME Arranged:    DME Agency:     HH Arranged:    HH Agency:     Status of Service:     If discussed at [employer] Length of Stay Meetings, dates discussed:    Additional Comments:  [name], [name], RN 06/05/2017, 11:10 AM

## 2017-06-05 NOTE — Progress Notes (Signed)
Verbal order by ENT to perform bedside swallow screen. Pt failed as he could not close lips around straw and gurgled/coughed after taking sips of water from open-top cup. Will continue to monitor swelling and will place speech eval after swelling has improved.

## 2017-06-05 NOTE — Progress Notes (Addendum)
PULMONARY / CRITICAL CARE MEDICINE   Name: [name] [name] MRN: [id number] DOB: May 21, 1956    ADMISSION DATE:  06/14/2017 CONSULTATION DATE:  06/16/2017  REFERRING MD:  Dr. Pecola Leisure EDP  CHIEF COMPLAINT:  Facial swelling  BRIEF PATIENT SUMMARY:   61 year old male with PMH as below, which is significant for DM, HTN, and Hepatitis C. He was in his usual state of health until Friday 6/15 when he noticed some minor R sided facial swelling. This continued to worsen in the following days and he developed associated fevers/chills, dyspnea, and dysphagia. He presented to urgent care 6/17 and was prescribed Clindamycin. He took his first dose, but was unable to take any subsequent doses due to dysphagia. He presented to [facility] ED 6/18 for this complaint. He had CT scan done which demonstrated right parotitis and facial cellulitis. Due to concern for airway compromise, PCCM asked to evaluate for admission.   SUBJECTIVE:  Tmax 102 overnight  Hyperglycemia overnight Awaiting am labs RN reports patient suctioning some purlurulent drainage out of mouth, does not feel swelling is improved.   VITAL SIGNS: BP 123/73 (BP Location: Right Arm)   Pulse 71   Temp 98.2 F (36.8 C) (Oral)   Resp (!) 25   Ht 5' (1.524 m)   Wt 140 lb 10.5 oz (63.8 kg)   SpO2 99%   BMI 27.47 kg/m   HEMODYNAMICS:   VENTILATOR SETTINGS:   INTAKE / OUTPUT: I/O last 3 completed shifts: In: 3068.3 [I.V.:1568.3; IV Piggyback:1500] Out: 2050 [Urine:2050]  PHYSICAL EXAMINATION: General:  Adult male, english speaking, lying in bed in NAD, son at bedside HEENT: Significant edema and induration to right side of face, right eye unable to open 2/2 swelling, very poor dentition, some blistering to oral mucosa, scant purulent drainage to lower gum line, handling secretions but pain with swallowing, no stridor, difficult to understand verbally, airway intact Neuro: Awake, oriented, MAE CV: s1s2 rrr, no m/r/g PULM: even/non-labored on  room air, lungs bilaterally clear GI: soft, non-tender, bsx4 active  Extremities: warm/dry, no edema  Skin: no rashes or lesions  LABS:  BMET  Recent Labs Lab 05/18/2017 0957 05/30/2017 1940  NA 129* 131*  K 5.5* 4.2  CL 95* 103  CO2  --  18*  BUN 26* 17  CREATININE 0.70 1.07  GLUCOSE 380* 303*    Electrolytes  Recent Labs Lab 06/03/2017 1940  CALCIUM 7.8*    CBC  Recent Labs Lab 05/28/2017 0939 06/15/2017 0957  WBC 20.9*  --   HGB 12.1* 14.6  HCT 38.9* 43.0  PLT 125*  --     Coag's No results for input(s): APTT, INR in the last 168 hours.  Sepsis Markers  Recent Labs Lab 06/12/2017 0958 06/13/2017 1707 06/13/2017 1940  LATICACIDVEN 5.26* 3.66* 3.8*    ABG No results for input(s): PHART, PCO2ART, PO2ART in the last 168 hours.  Liver Enzymes  Recent Labs Lab 06/07/2017 1940  AST 79*  ALT 55  ALKPHOS 65  BILITOT 4.6*  ALBUMIN 2.6*    Cardiac Enzymes No results for input(s): TROPONINI, PROBNP in the last 168 hours.  Glucose  Recent Labs Lab 06/13/2017 1810 06/09/2017 1943 05/24/2017 2152 06/14/2017 2332 06/05/17 0328 06/05/17 0735  GLUCAP 350* 298* 273* 272* 274* 282*    Imaging Ct Soft Tissue Neck W Contrast  Result Date: 05/22/2017 CLINICAL DATA:  RIGHT-sided facial swelling. EXAM: CT NECK WITH CONTRAST TECHNIQUE: Multidetector CT imaging of the neck was performed using the standard protocol following  the bolus administration of intravenous contrast. CONTRAST:  75mL ISOVUE-300 IOPAMIDOL (ISOVUE-300) INJECTION 61% COMPARISON:  None. FINDINGS: Pharynx and larynx: Mild extrinsic mass effect on the pharynx and airway, due to RIGHT-sided inflammation Salivary glands: Diffusely enlarged RIGHT parotid gland, marked surrounding edema, extending downward over the RIGHT face. The RIGHT masseter muscle is enlarged. LEFT parotid gland is normal. LEFT and RIGHT submandibular glands do not show inflammation although the RIGHT submandibular gland is surrounded by fluid.  No calculi. Thyroid: Normal. Lymph nodes: Reactive lymph nodes on the RIGHT, not pathologically enlarged. Vascular: Unremarkable. Limited intracranial: Negative. Visualized orbits: Negative. Mastoids and visualized paranasal sinuses: Minimal ethmoid sinus fluid. Skeleton: Unremarkable. Relatively minor dental caries and periapical lucencies, out of proportion to the degree of inflammation. Upper chest: No acute findings. Other: None. IMPRESSION: Diffuse facial swelling favored to represent bacterial RIGHT parotitis. Significant facial cellulitis, with RIGHT muscle of mastication enlargement. No visible calculi. No drainable abscess. Minor dental caries and periapical lucencies, not felt to be the source of inflammation. Findings discussed with ordering provider Electronically Signed   By: Elsie StainJohn T Curnes M.D.   On: 06/02/2017 11:48    STUDIES:  CT soft tissue neck 6/18 > Diffuse facial swelling favored to represent bacterial RIGHT parotitis. Significant facial cellulitis, with RIGHT muscle of mastication enlargement. No visible calculi. No drainable abscess. Minor dental caries and periapical lucencies, not felt to be the source of inflammation.  CULTURES: Blood 6/18 > MRSA PCR 6/18 > neg  ANTIBIOTICS: Clindamycin 6/18 > Unasyn 6/18 >  SIGNIFICANT EVENTS: 6/18 admit to ICU for R parotitis  LINES/TUBES: PIV   DISCUSSION: 61 year old male admitted with R Parotitis and concern for airway compromise. Admitted to ICU antibiotics and close monitoring.   ASSESSMENT / PLAN:  Right Parotitis Right facial cellulitis  Poor dentition  - Currently protecting airway. No evidence of airway compression on CT.  P:  - Will consult ENT - Spoke to ID to ensure proper abx therapy, agree with continuing clindamycin and unasyn  - ongoing airway monitoring in SDU  - ABX as above, follow cultures.  - Solumedrol 60mg  every 6 hours - Low dose fentanyl for pain management.  - Trend CBC  Elevated lactic acid:  s/p 30cc/kg bolus in ED, hemodynamically stable  - Continue maintenance fluid NS 17000mL/hr - Trend lactate  Hypertension - continue to monitor, not on home medications.  DM Hyperglycemia- on steroids - NPO - resistant SSI - add lantus 10 units  - CBG q 4 hr - Holding home metformin, glipizide while NPO   Hepatitis C Mild transaminitis - no tylenol - trend LFTs  Hyponatremia   Hyperkalemia  - BMP now   Best practice: -NPO -SCDs/ will defer heparin SQ at this point given potential for surgical intervention  -Protonix   Will transfer to SDU and TRH to assume primary care 6/20.  PCCM will sign off and be available as needed.   Posey BoyerBrooke Simpson, AGACNP-BC [facility] Pgr: [phone] or if no answer [phone] 06/05/2017, 9:53 AM    Attending Note:  61 year old male with parotitis without airway compromise.  Swelling worse today on exam.  I reviewed CT myself, parotitis with inflammation and swelling noted.  Discussed with PCCM-NP.  Parotitis:  - Abx as below  - ENT consult called  - Monitor  Airway compromise:  - Monitor in SDU for airway concerns  Hypoxemia:  - Titrate O2 for sat of 88-92%  Sepsis:  - Unasyn  -  Clinda  - Curb sided ID, ok with abx  Transfer to SDU and to [facility] service with PCCM off 6/20.  Patient seen and examined, agree with above note.  I dictated the care and orders written for this patient under my direction.  Alyson Reedy, MD [phone]

## 2017-06-05 NOTE — Progress Notes (Signed)
Pt complaining of increased facial swelling. I assessed and found the swelling to have crossed the midline and palpable swelling is found on the left cheek as well as the left side of the trachea. No evidence of stridor was heard. Joneen RoachPaul Hoffman, NP was called to bedside as he had seen patient the previous day. He noted no stridor as well and gave orders to continue to monitor.

## 2017-06-06 DIAGNOSIS — B182 Chronic viral hepatitis C: Secondary | ICD-10-CM

## 2017-06-06 DIAGNOSIS — K1121 Acute sialoadenitis: Secondary | ICD-10-CM

## 2017-06-06 DIAGNOSIS — D696 Thrombocytopenia, unspecified: Secondary | ICD-10-CM

## 2017-06-06 DIAGNOSIS — E119 Type 2 diabetes mellitus without complications: Secondary | ICD-10-CM

## 2017-06-06 LAB — MAGNESIUM: MAGNESIUM: 2 mg/dL (ref 1.7–2.4)

## 2017-06-06 LAB — COMPREHENSIVE METABOLIC PANEL
ALBUMIN: 2 g/dL — AB (ref 3.5–5.0)
ALT: 31 U/L (ref 17–63)
AST: 50 U/L — AB (ref 15–41)
Alkaline Phosphatase: 52 U/L (ref 38–126)
Anion gap: 8 (ref 5–15)
BUN: 18 mg/dL (ref 6–20)
CHLORIDE: 110 mmol/L (ref 101–111)
CO2: 17 mmol/L — AB (ref 22–32)
Calcium: 7.5 mg/dL — ABNORMAL LOW (ref 8.9–10.3)
Creatinine, Ser: 0.9 mg/dL (ref 0.61–1.24)
GFR calc Af Amer: >60 mL/min (ref >60)
GLUCOSE: 212 mg/dL — AB (ref 65–99)
POTASSIUM: 4 mmol/L (ref 3.5–5.1)
SODIUM: 135 mmol/L (ref 135–145)
Total Bilirubin: 4.8 mg/dL — ABNORMAL HIGH (ref 0.3–1.2)
Total Protein: 7 g/dL (ref 6.5–8.1)

## 2017-06-06 LAB — CBC
HEMATOCRIT: 29.2 % — AB (ref 39.0–52.0)
HEMOGLOBIN: 8.9 g/dL — AB (ref 13.0–17.0)
MCH: 23.1 pg — AB (ref 26.0–34.0)
MCHC: 30.5 g/dL (ref 30.0–36.0)
MCV: 75.8 fL — ABNORMAL LOW (ref 78.0–100.0)
Platelets: 105 10*3/uL — ABNORMAL LOW (ref 150–400)
RBC: 3.85 MIL/uL — AB (ref 4.22–5.81)
RDW: 14.5 % (ref 11.5–15.5)
WBC: 13.3 10*3/uL — ABNORMAL HIGH (ref 4.0–10.5)

## 2017-06-06 LAB — GLUCOSE, CAPILLARY
GLUCOSE-CAPILLARY: 216 mg/dL — AB (ref 65–99)
GLUCOSE-CAPILLARY: 222 mg/dL — AB (ref 65–99)
GLUCOSE-CAPILLARY: 224 mg/dL — AB (ref 65–99)
GLUCOSE-CAPILLARY: 237 mg/dL — AB (ref 65–99)
Glucose-Capillary: 221 mg/dL — ABNORMAL HIGH (ref 65–99)
Glucose-Capillary: 205 mg/dL — ABNORMAL HIGH (ref 65–99)

## 2017-06-06 MED ORDER — GLUCERNA 1.2 CAL PO LIQD
1000.0000 mL | ORAL | Status: DC
  Filled 2017-06-06 (×2): qty 1000

## 2017-06-06 MED ORDER — JEVITY 1.2 CAL PO LIQD: 1000.0000 mL | ORAL | Status: DC

## 2017-06-06 MED ORDER — MORPHINE SULFATE (PF) 4 MG/ML IV SOLN
1.0000 mg | INTRAVENOUS | Status: DC | PRN
  Administered 2017-06-06: 2 mg via INTRAVENOUS
  Administered 2017-06-07: 4 mg via INTRAVENOUS
  Administered 2017-06-07: 3 mg via INTRAVENOUS
  Administered 2017-06-07 – 2017-06-08 (×4): 4 mg via INTRAVENOUS
  Filled 2017-06-06 (×8): qty 1

## 2017-06-06 NOTE — Progress Notes (Signed)
Cortrak Tube Team Note:  Consult received to place a Cortrak feeding tube.   A 10 F Cortrak tube was placed in the L nare and secured with a nasal bridle at 62 cm. Per the Cortrak monitor reading the tube tip is in the stomach.   No x-ray is required. RN may begin using tube.   If the tube becomes dislodged please keep the tube and contact the Cortrak team at [email or web address] (password [email or web address]) for replacement.  If after hours and replacement cannot be delayed, place a NG tube and confirm placement with an abdominal x-ray.      Trenton GammonJessica Tryone Kille, MS, RD, LDN, [facility] Inpatient Clinical Dietitian Pager # [phone] After hours/weekend pager # [phone]

## 2017-06-06 NOTE — Care Management Note (Addendum)
Case Management Note  Patient Details  Name: [name] [name] MRN: [id number] Date of Birth: 1956-04-23  Subjective/Objective:   From home with wife, pta indep,  Presents with  right acute parotitis w/ extensive facial cellulitis. He has [employer] insurance. [facility] but they have moved to another area and he has being seeing a Dr. Neva SeatGreene.  PCP   Dr. Neva SeatGreene             Action/Plan: NCM will follow for dc needs.   Expected Discharge Date:                  Expected Discharge Plan:  Home/Self Care  In-House Referral:     Discharge planning Services  CM Consult  Post Acute Care Choice:    Choice offered to:     DME Arranged:    DME Agency:     HH Arranged:    HH Agency:     Status of Service:  In process, will continue to follow  If discussed at Long Length of Stay Meetings, dates discussed:    Additional Comments:  [name] [name], [name] Heagle [place], RN 06/06/2017, 4:38 PM

## 2017-06-06 NOTE — Progress Notes (Signed)
   Subjective:    Patient ID: [name] [name], male    DOB: 10-31-1956, 61 y.o.   MRN: [id number]  HPI Continues with marked facial edema.  Still difficult to talk.  Right eye remains swollen closed.  No airway problems overnight.    Review of Systems     Objective:   Physical Exam AF VSS Alert, NAD Marked firm edema of right face with tenderness.  Right eyelids swollen closed.     Assessment & Plan:  Right acute parotitis with surrounding cellulitis No clear improvement on exam but WBC continues to improve.  Continue broad spectrum IV antibiotics, hydration, and heat.  Expect to start to see some improvement in edema tomorrow or next day.

## 2017-06-06 NOTE — Progress Notes (Signed)
Initial Nutrition Assessment  DOCUMENTATION CODES:   Not applicable  INTERVENTION:    Glucerna 1.2 at 60 ml/h (1440 ml per day)  Provides 1728 kcal, 86 gm protein, 1159 ml free water daily  NUTRITION DIAGNOSIS:   Inadequate oral intake related to inability to eat as evidenced by NPO status.  GOAL:   Patient will meet greater than or equal to 90% of their needs  MONITOR:   TF tolerance, Labs, I & O's  REASON FOR ASSESSMENT:   Consult Enteral/tube feeding initiation and management  ASSESSMENT:   61 yo male with PMH of DM, HTN, Hepatitis C who was admitted on 6/18 with facial swelling, dyspnea, and dysphagia related to R parotitis and facial cellulitis.   Discussed patient in ICU rounds and with RN today. Cortrak being placed today. Received MD Consult for TF initiation and management. Nutrition-Focused physical exam completed. Findings are no fat depletion, no muscle depletion, and mild facial edema.  Labs and medications reviewed. CBG's: [phone]  Diet Order:  Diet NPO time specified  Skin:  Reviewed, no issues  Last BM:  6/19  Height:   Ht Readings from Last 1 Encounters:  06/03/2017 5' (1.524 m)    Weight:   Wt Readings from Last 1 Encounters:  06/06/17 144 lb 13.5 oz (65.7 kg)    Ideal Body Weight:  48.2 kg  BMI:  Body mass index is 28.29 kg/m.  Estimated Nutritional Needs:   Kcal:  1700-1900  Protein:  80-90 gm  Fluid:  1.7-1.9 L  EDUCATION NEEDS:   No education needs identified at this time  Joaquin CourtsKimberly Rendon Howell, RD, LDN, CNSC Pager [phone] After Hours Pager [phone]

## 2017-06-06 NOTE — Progress Notes (Addendum)
[place] TEAM 1 - Stepdown/ICU TEAM  [name] [name]  ZOX:[id number] DOB: January 11, 1956 DOA: 06/02/2017 PCP: Tonye Pearsonoolittle, Robert P, MD (Inactive)    Brief Narrative:  61 year old male with hx DM, HTN, and Hepatitis C who first noticed some minor R sided facial swelling on 06/01/17. This continued to worsen and he developed associated fevers/chills, dyspnea, and dysphagia. He presented to an urgent care 6/17 and was prescribed Clindamycin. He took his first dose, but was unable to take any subsequent doses due to dysphagia. [facility]18 when a CT demonstrated right parotitis and facial cellulitis.   Significant Events: 6/18 admit to ICU for R parotitis 6/20 [facility] assumed care   Subjective: Pt says he feels a little better.  He denies sob or cp.  His facial swelling persists and is quite impressive.  He is in no apparent respiratory distress.  He is presently able to handle his oral secretions w/o drooling.    Assessment & Plan:  Right Acute Parotitis w/ extensive facial cellulitis - Poor dentition  No role for surgical intervention per ENT - keep hydrated and cont broad abx coverge - stop steroid as there is no evidence of airway compromise at present   HTN BP currently controlled  DM2 CBG poorly controlled w/ use of steroids - adjust tx and follow - stop steroids   Hepatitis C genotype 1 w/ splenomegaly - diagnoses 08/02/2015 Unclear if pt has been evaluated for tx previously - will benefit [facility] after his d/c - has been advised to avoid EtOH use   Nutrition Pt unable to close mouth/lips and has done very poorly on bedside RN swallow eval - swelling will clearly take 2+ additional days to improve significantly and pt has been npo for 2 days already - place cortrak today to begin supplemental feeding    DVT prophylaxis: SCDs in setting of thrombocytopenia  Code Status: FULL CODE Family Communication: no family present at time of exam    Disposition Plan: SDU for close monitoring of airway protection   Consultants:  PCCM ENT  Antimicrobials:  Clindamycin 6/18 > Unasyn 6/18 >  Objective: Blood pressure 118/79, pulse 64, temperature 98.5 F (36.9 C), temperature source Oral, resp. rate (!) 22, height 5' (1.524 m), weight 65.7 kg (144 lb 13.5 oz), SpO2 99 %.  Intake/Output Summary (Last 24 hours) at 06/06/17 0917 Last data filed at 06/06/17 0800  Gross per 24 hour  Intake             2650 ml  Output              303 ml  Net             2347 ml   Filed Weights   05/24/2017 1808 06/05/17 0500 06/06/17 0437  Weight: 63.8 kg (140 lb 10.5 oz) 63.8 kg (140 lb 10.5 oz) 65.7 kg (144 lb 13.5 oz)    Examination: General: No acute respiratory distress HEENT:  Severe R facial swelling - unable to open R eye - L w/ only minimal swelling - able to speak and swallow secretions  Lungs: Clear to auscultation bilaterally without wheezes or crackles Cardiovascular: Regular rate and rhythm without murmur gallop or rub normal S1 and S2 Abdomen: Nontender, nondistended, soft, bowel sounds positive, no rebound, no ascites, no appreciable mass Extremities: No significant cyanosis, clubbing, or edema bilateral lower extremities  CBC:  Recent Labs Lab 05/20/2017 0939 06/16/2017 0957 06/05/17  1610 06/06/17 0608  WBC 20.9*  --  14.8* 13.3*  NEUTROABS 17.9*  --   --   --   HGB 12.1* 14.6 10.0* 8.9*  HCT 38.9* 43.0 32.5* 29.2*  MCV 77.8*  --  77.2* 75.8*  PLT 125*  --  106* 105*   Basic Metabolic Panel:  Recent Labs Lab 05/30/2017 0957 05/19/2017 1940 06/05/17 0925 06/06/17 0322  NA 129* 131* 135 135  K 5.5* 4.2 4.1 4.0  CL 95* 103 109 110  CO2  --  18* 17* 17*  GLUCOSE 380* 303* 267* 212*  BUN 26* 17 19 18   CREATININE 0.70 1.07 0.96 0.90  CALCIUM  --  7.8* 7.7* 7.5*  MG  --   --   --  2.0   GFR: Estimated Creatinine Clearance: 70.4 mL/min (by C-G formula based on SCr of 0.9 mg/dL).  Liver Function Tests:  Recent  Labs Lab 06/16/2017 1940 06/06/17 0322  AST 79* 50*  ALT 55 31  ALKPHOS 65 52  BILITOT 4.6* 4.8*  PROT 7.7 7.0  ALBUMIN 2.6* 2.0*    HbA1C: Hemoglobin A1C  Date/Time Value Ref Range Status  04/05/2015 07:34 PM 6.3  Final  07/28/2014 08:55 PM 8.0  Final   Hgb A1c MFr Bld  Date/Time Value Ref Range Status  09/11/2015 05:34 AM 7.4 (H) 4.8 - 5.6 % Final    Comment:    (NOTE)         Pre-diabetes: 5.7 - 6.4         Diabetes: >6.4         Glycemic control for adults with diabetes: <7.0     CBG:  Recent Labs Lab 06/05/17 1622 06/05/17 2027 06/06/17 0017 06/06/17 0410 06/06/17 0736  GLUCAP 252* 240* 224* 222* 221*    Recent Results (from the past 240 hour(s))  Blood Culture (routine x 2)     Status: None (Preliminary result)   Collection Time: 06/05/2017  9:38 AM  Result Value Ref Range Status   Specimen Description BLOOD RIGHT HAND  Final   Special Requests   Final    BOTTLES DRAWN AEROBIC AND ANAEROBIC Blood Culture adequate volume   Culture NO GROWTH 1 DAY  Final   Report Status PENDING  Incomplete  Blood Culture (routine x 2)     Status: None (Preliminary result)   Collection Time: 05/24/2017  9:39 AM  Result Value Ref Range Status   Specimen Description BLOOD LEFT ANTECUBITAL  Final   Special Requests   Final    BOTTLES DRAWN AEROBIC AND ANAEROBIC Blood Culture adequate volume   Culture NO GROWTH 1 DAY  Final   Report Status PENDING  Incomplete  MRSA PCR Screening     Status: None   Collection Time: 06/07/2017  6:01 PM  Result Value Ref Range Status   MRSA by PCR NEGATIVE NEGATIVE Final    Comment:        The GeneXpert MRSA Assay (FDA approved for NASAL specimens only), is one component of a comprehensive MRSA colonization surveillance program. It is not intended to diagnose MRSA infection nor to guide or monitor treatment for MRSA infections.      Scheduled Meds: . insulin aspart  0-20 Units Subcutaneous Q4H  . insulin glargine  10 Units Subcutaneous  Daily  . mouth rinse  15 mL Mouth Rinse BID  . methylPREDNISolone (SOLU-MEDROL) injection  60 mg Intravenous Q6H  . pantoprazole (PROTONIX) IV  40 mg Intravenous QHS     LOS:  2 days   Lonia Blood, MD [facility] Office  [phone] Pager - Text Page per Amion as per below:  On-Call/Text Page:      [email or web address]      password [email or web address]  If 7PM-7AM, please contact night-coverage [email or web address] Password [email or web address] 06/06/2017, 9:17 AM

## 2017-06-06 NOTE — Progress Notes (Signed)
Attempted to call report to 52M. RN not available. Will call back in 15 minutes.

## 2017-06-07 LAB — COMPREHENSIVE METABOLIC PANEL
ALK PHOS: 59 U/L (ref 38–126)
ALT: 50 U/L (ref 17–63)
AST: 172 U/L — ABNORMAL HIGH (ref 15–41)
Albumin: 2 g/dL — ABNORMAL LOW (ref 3.5–5.0)
Anion gap: 5 (ref 5–15)
BUN: 20 mg/dL (ref 6–20)
CALCIUM: 7.7 mg/dL — AB (ref 8.9–10.3)
CHLORIDE: 112 mmol/L — AB (ref 101–111)
CO2: 23 mmol/L (ref 22–32)
CREATININE: 0.92 mg/dL (ref 0.61–1.24)
GFR calc Af Amer: >60 mL/min (ref >60)
Glucose, Bld: 289 mg/dL — ABNORMAL HIGH (ref 65–99)
Potassium: 3.6 mmol/L (ref 3.5–5.1)
Sodium: 140 mmol/L (ref 135–145)
Total Bilirubin: 5.8 mg/dL — ABNORMAL HIGH (ref 0.3–1.2)
Total Protein: 6.9 g/dL (ref 6.5–8.1)

## 2017-06-07 LAB — GLUCOSE, CAPILLARY
GLUCOSE-CAPILLARY: 268 mg/dL — AB (ref 65–99)
GLUCOSE-CAPILLARY: 174 mg/dL — AB (ref 65–99)
Glucose-Capillary: 139 mg/dL — ABNORMAL HIGH (ref 65–99)
Glucose-Capillary: 150 mg/dL — ABNORMAL HIGH (ref 65–99)
Glucose-Capillary: 264 mg/dL — ABNORMAL HIGH (ref 65–99)
Glucose-Capillary: 265 mg/dL — ABNORMAL HIGH (ref 65–99)
Glucose-Capillary: 293 mg/dL — ABNORMAL HIGH (ref 65–99)

## 2017-06-07 LAB — CBC
HCT: 28.9 % — ABNORMAL LOW (ref 39.0–52.0)
Hemoglobin: 8.7 g/dL — ABNORMAL LOW (ref 13.0–17.0)
MCH: 22.8 pg — AB (ref 26.0–34.0)
MCHC: 30.1 g/dL (ref 30.0–36.0)
MCV: 75.9 fL — AB (ref 78.0–100.0)
PLATELETS: 128 10*3/uL — AB (ref 150–400)
RBC: 3.81 MIL/uL — ABNORMAL LOW (ref 4.22–5.81)
RDW: 14.5 % (ref 11.5–15.5)
WBC: 13.1 10*3/uL — AB (ref 4.0–10.5)

## 2017-06-07 NOTE — Progress Notes (Signed)
Inpatient Diabetes Program Recommendations  AACE/ADA: New Consensus Statement on Inpatient Glycemic Control (2015)  Target Ranges:  Prepandial:   less than 140 mg/dL      Peak postprandial:   less than 180 mg/dL (1-2 hours)      Critically ill patients:  140 - 180 mg/dL   Results for [name] [name], [facility] (MRN [id number]) as of 06/07/2017 11:56  Ref. Range 06/06/2017 07:36 06/06/2017 11:43 06/06/2017 15:22 06/06/2017 19:52 06/07/2017 00:07 06/07/2017 04:31 06/07/2017 08:11 06/07/2017 11:48  Glucose-Capillary Latest Ref Range: 65 - 99 mg/dL 086221 (H) 578237 (H) 469205 (H) 216 (H) 264 (H) 293 (H) 268 (H) 265 (H)   Review of Glycemic Control  Current orders for Inpatient glycemic control: Novolog 0-20 units Q4H  Inpatient Diabetes Program Recommendations: Insulin - Basal: Please consider ordering Lantus 10 units Q24H. Insulin - Tube Feeding Coverage: Please consider ordering Novolog 4 units Q4H for tube feeding coverage (in addition to Novolog correction scale). HgbA1C: Please consider ordering (as add on if possible) an A1C to evaluate glycemic control over the past 2-3 months.  Thanks, [name] PennerMarie Josaphine Shimamoto, RN, MSN, CDE Diabetes Coordinator Inpatient Diabetes Program [phone] (Team Pager from 8am to 5pm)

## 2017-06-07 NOTE — Progress Notes (Signed)
Pharmacy Antibiotic Note  [name] [name] is a 61 y.o. male admitted on 06/14/2017 with diffuse facial swelling and cellulitis, with concerns of R-sided parotitis. CT findings correlate with parotitis. Tmax 99, WBC improving 13.1, SCr stable 0.92.   Plan: -Continue Unasyn 3 g IV q6h -Continue Clindamycin 600 mg IV q8h -Monitor renal fx, cultures, duration of therapy   Height: 5' (152.4 cm) Weight: 145 lb 15.1 oz (66.2 kg) IBW/kg (Calculated) : 50  Temp (24hrs), Avg:98.5 F (36.9 C), Min:98.1 F (36.7 C), Max:99 F (37.2 C)   Recent Labs Lab 06/07/2017 0939 06/02/2017 0957 06/12/2017 0958 06/03/2017 1707 05/19/2017 1940 06/05/17 0925 06/05/17 1126 06/06/17 0322 06/06/17 0608 06/07/17 0425  WBC 20.9*  --   --   --   --  14.8*  --   --  13.3* 13.1*  CREATININE  --  0.70  --   --  1.07 0.96  --  0.90  --  0.92  LATICACIDVEN  --   --  5.26* 3.66* 3.8*  --  2.2*  --   --   --     Estimated Creatinine Clearance: 69.1 mL/min (by C-G formula based on SCr of 0.92 mg/dL).    No Known Allergies  Antimicrobials this admission: 6/18 Unasyn > 6/18 Clindamycin >   Microbiology results: 6/18 blood cx: ngtd 6/18 HIV: neg 6/18 MRSA PCR: neg  Casilda Carlsaylor Lorita Forinash, PharmD, BCPS PGY-2 Infectious Diseases Pharmacy Resident Pager: [phone] 06/07/2017 11:49 AM

## 2017-06-07 NOTE — Progress Notes (Signed)
Patient ID: [name] [name], male   DOB: 1956-12-18, 61 y.o.   MRN: [id number]                                                                PROGRESS NOTE                                                                                                                                                                                                             Patient Demographics:    [name] [name], is a 61 y.o. male, DOB - 1956-12-18, UYQ:[id number]  Admit date - 06/09/2017   Admitting Physician Chilton GreathousePraveen Mannam, MD  Outpatient Primary MD for the patient is Tonye Pearsonoolittle, Robert P, MD (Inactive)  LOS - 3  Outpatient Specialists:    Chief Complaint  Patient presents with  . Facial Swelling       Brief Narrative   61 year old male with hx DM, HTN, and Hepatitis C who first noticed some minor R sided facial swelling on 06/01/17. This continued to worsen and he developed associated fevers/chills, dyspnea, and dysphagia. He presented to an urgent care 6/17 and was prescribed Clindamycin. He took his first dose, but was unable to take any subsequent doses due to dysphagia. He [facility] 6/18 when a CT demonstrated right parotitis and facial cellulitis.   Significant Events: 6/18 admit to ICU for R parotitis 6/20 [facility] assumed care    Subjective:    Aristides Peltzer today still has significant right facial swelling, quite impressive.   Pt would like iv switched from left to right arm, RN notified of his request.  No headache, No chest pain, No abdominal pain - No Nausea, No new weakness tingling or numbness, No Cough - SOB.    Assessment  & Plan :    Active Problems:   Parotitis   Right Acute Parotitis w/ extensive facial cellulitis - Poor dentition  Off steroid 6/20  as there is no evidence of airway compromise at present  No role for surgical intervention per ENT - keep hydrated and cont broad abx coverge    HTN BP currently controlled  DM2 CBG poorly controlled w/ use of steroids -  adjust tx and follow - stop steroids   Hepatitis C genotype 1 w/ splenomegaly - diagnoses 08/02/2015 Unclear  if pt has been evaluated for tx previously - will benefit from outpt f/u at [facility] for ID after his d/c - has been advised to avoid EtOH use   Thrombocytopenia Repeat cbc in am  Nutrition Pt unable to close mouth/lips and has done very poorly on bedside RN swallow eval - swelling will clearly take 2+ additional days to improve significantly and pt has been npo for 2 days already - place cortrak today to begin supplemental feeding    DVT prophylaxis: SCDs in setting of thrombocytopenia  Code Status: FULL CODE Family Communication: no family present at time of exam  Disposition Plan: SDU for close monitoring of airway protection   Consultants:  PCCM ENT  Antimicrobials:  Clindamycin 6/18 > Unasyn 6/18 >  Lab Results  Component Value Date   PLT 105 (L) 06/06/2017      Anti-infectives    Start     Dose/Rate Route Frequency Ordered Stop   06-05-17 1800  clindamycin (CLEOCIN) IVPB 600 mg     600 mg 100 mL/hr over 30 Minutes Intravenous Every 8 hours 06/05/2017 1658     06/05/17 1730  Ampicillin-Sulbactam (UNASYN) 3 g in sodium chloride 0.9 % 100 mL IVPB     3 g 200 mL/hr over 30 Minutes Intravenous Every 6 hours 2017-06-05 1658     06/05/17 0945  clindamycin (CLEOCIN) IVPB 600 mg     600 mg 100 mL/hr over 30 Minutes Intravenous  Once 2017-06-05 0933 June 05, 2017 1019        Objective:   Vitals:   06/07/17 0010 06/07/17 0400 06/07/17 0410 06/07/17 0433  BP: 132/84 130/78  130/78  Pulse: 72 71 71 70  Resp: (!) 21 14 13 17   Temp:    98.1 F (36.7 C)  TempSrc:    Axillary  SpO2: 98% 96% 98% 99%  Weight:    66.2 kg (145 lb 15.1 oz)  Height:        Wt Readings from Last 3 Encounters:  06/07/17 66.2 kg (145 lb 15.1 oz)  03/09/16 67.1 kg (148 lb)  09/14/15 70.8 kg (156 lb)     Intake/Output Summary (Last 24 hours) at 06/07/17 0701 Last data filed at  06/07/17 0500  Gross per 24 hour  Intake          2797.92 ml  Output              627 ml  Net          2170.92 ml     Physical Exam  Awake Alert, Oriented X 3, No new F.N deficits, Normal affect [place].AT,PERRAL R facial swelling, significant, right eye closed Nasal Feeding tube in place Supple Neck,No JVD, No cervical lymphadenopathy appriciated.  Symmetrical Chest wall movement, Good air movement bilaterally, CTAB RRR,No Gallops,Rubs or new Murmurs, No Parasternal Heave +ve B.Sounds, Abd Soft, No tenderness, No organomegaly appriciated, No rebound - guarding or rigidity. No Cyanosis, Clubbing or edema, No new Rash or bruise      Data Review:    CBC  Recent Labs Lab 06/05/17 0939 06-05-2017 0957 06/05/17 0925 06/06/17 0608  WBC 20.9*  --  14.8* 13.3*  HGB 12.1* 14.6 10.0* 8.9*  HCT 38.9* 43.0 32.5* 29.2*  PLT 125*  --  106* 105*  MCV 77.8*  --  77.2* 75.8*  MCH 24.2*  --  23.8* 23.1*  MCHC 31.1  --  30.8 30.5  RDW 14.6  --  14.4 14.5  LYMPHSABS 1.1  --   --   --  MONOABS 1.8*  --   --   --   EOSABS 0.0  --   --   --   BASOSABS 0.0  --   --   --     Chemistries   Recent Labs Lab 06/06/2017 0957 05/22/2017 1940 06/05/17 0925 06/06/17 0322 06/07/17 0425  NA 129* 131* 135 135 140  K 5.5* 4.2 4.1 4.0 3.6  CL 95* 103 109 110 112*  CO2  --  18* 17* 17* 23  GLUCOSE 380* 303* 267* 212* 289*  BUN 26* 17 19 18 20   CREATININE 0.70 1.07 0.96 0.90 0.92  CALCIUM  --  7.8* 7.7* 7.5* 7.7*  MG  --   --   --  2.0  --   AST  --  79*  --  50* 172*  ALT  --  55  --  31 50  ALKPHOS  --  65  --  52 59  BILITOT  --  4.6*  --  4.8* 5.8*   ------------------------------------------------------------------------------------------------------------------ No results for input(s): CHOL, HDL, LDLCALC, TRIG, CHOLHDL, LDLDIRECT in the last 72 hours.  Lab Results  Component Value Date   HGBA1C 7.4 (H) 09/11/2015    ------------------------------------------------------------------------------------------------------------------ No results for input(s): TSH, T4TOTAL, T3FREE, THYROIDAB in the last 72 hours.  Invalid input(s): FREET3 ------------------------------------------------------------------------------------------------------------------ No results for input(s): VITAMINB12, FOLATE, FERRITIN, TIBC, IRON, RETICCTPCT in the last 72 hours.  Coagulation profile No results for input(s): INR, PROTIME in the last 168 hours.  No results for input(s): DDIMER in the last 72 hours.  Cardiac Enzymes No results for input(s): CKMB, TROPONINI, MYOGLOBIN in the last 168 hours.  Invalid input(s): CK ------------------------------------------------------------------------------------------------------------------ No results found for: BNP  Inpatient Medications  Scheduled Meds: . insulin aspart  0-20 Units Subcutaneous Q4H  . mouth rinse  15 mL Mouth Rinse BID  . pantoprazole (PROTONIX) IV  40 mg Intravenous QHS   Continuous Infusions: . sodium chloride 75 mL/hr at 06/06/17 2032  . ampicillin-sulbactam (UNASYN) IV 3 g (06/07/17 0516)  . clindamycin (CLEOCIN) IV Stopped (06/07/17 0140)  . feeding supplement (GLUCERNA 1.2 CAL) 1,000 mL (06/06/17 1900)   PRN Meds:.morphine injection  Micro Results Recent Results (from the past 240 hour(s))  Blood Culture (routine x 2)     Status: None (Preliminary result)   Collection Time: 06/16/2017  9:38 AM  Result Value Ref Range Status   Specimen Description BLOOD RIGHT HAND  Final   Special Requests   Final    BOTTLES DRAWN AEROBIC AND ANAEROBIC Blood Culture adequate volume   Culture NO GROWTH 2 DAYS  Final   Report Status PENDING  Incomplete  Blood Culture (routine x 2)     Status: None (Preliminary result)   Collection Time: 06/06/2017  9:39 AM  Result Value Ref Range Status   Specimen Description BLOOD LEFT ANTECUBITAL  Final   Special Requests   Final     BOTTLES DRAWN AEROBIC AND ANAEROBIC Blood Culture adequate volume   Culture NO GROWTH 2 DAYS  Final   Report Status PENDING  Incomplete  MRSA PCR Screening     Status: None   Collection Time: 05/28/2017  6:01 PM  Result Value Ref Range Status   MRSA by PCR NEGATIVE NEGATIVE Final    Comment:        The GeneXpert MRSA Assay (FDA approved for NASAL specimens only), is one component of a comprehensive MRSA colonization surveillance program. It is not intended to diagnose MRSA infection nor to guide or  monitor treatment for MRSA infections.     Radiology Reports Ct Soft Tissue Neck W Contrast  Result Date: 05/23/2017 CLINICAL DATA:  RIGHT-sided facial swelling. EXAM: CT NECK WITH CONTRAST TECHNIQUE: Multidetector CT imaging of the neck was performed using the standard protocol following the bolus administration of intravenous contrast. CONTRAST:  75mL ISOVUE-300 IOPAMIDOL (ISOVUE-300) INJECTION 61% COMPARISON:  None. FINDINGS: Pharynx and larynx: Mild extrinsic mass effect on the pharynx and airway, due to RIGHT-sided inflammation Salivary glands: Diffusely enlarged RIGHT parotid gland, marked surrounding edema, extending downward over the RIGHT face. The RIGHT masseter muscle is enlarged. LEFT parotid gland is normal. LEFT and RIGHT submandibular glands do not show inflammation although the RIGHT submandibular gland is surrounded by fluid. No calculi. Thyroid: Normal. Lymph nodes: Reactive lymph nodes on the RIGHT, not pathologically enlarged. Vascular: Unremarkable. Limited intracranial: Negative. Visualized orbits: Negative. Mastoids and visualized paranasal sinuses: Minimal ethmoid sinus fluid. Skeleton: Unremarkable. Relatively minor dental caries and periapical lucencies, out of proportion to the degree of inflammation. Upper chest: No acute findings. Other: None. IMPRESSION: Diffuse facial swelling favored to represent bacterial RIGHT parotitis. Significant facial cellulitis, with RIGHT  muscle of mastication enlargement. No visible calculi. No drainable abscess. Minor dental caries and periapical lucencies, not felt to be the source of inflammation. Findings discussed with ordering provider Electronically Signed   By: Elsie Stain M.D.   On: 06/13/2017 11:48    Time Spent in minutes  30   Pearson Grippe M.D on 06/07/2017 at 7:01 AM  Between 7am to 7pm - Pager - [phone]  After 7pm go to [email or web address] - password [facility]  [facility] -  Office  [phone]

## 2017-06-07 NOTE — Progress Notes (Signed)
   Subjective:    Patient ID: [name] [name], male    DOB: 24-Oct-1956, 61 y.o.   MRN: [id number]  HPI Still with marked facial swelling, does not feel better.  Feeding tube in place now for nutrition.   Review of Systems     Objective:   Physical Exam AF VSS Alert, NAD Continued marked, tender edema of right face, parotid, and eyelids.  Not changed from yesterday.     Assessment & Plan:  Right acute parotitis with surrounding cellulitis  Still without clear improvement on exam.  WBC trending downward.  Continue IV antibiotics.  If not starting to improve tomorrow, would repeat CT scan.

## 2017-06-08 ENCOUNTER — Encounter (HOSPITAL_COMMUNITY): Payer: Self-pay | Admitting: Radiology

## 2017-06-08 ENCOUNTER — Inpatient Hospital Stay (HOSPITAL_COMMUNITY): Payer: 59

## 2017-06-08 DIAGNOSIS — I10 Essential (primary) hypertension: Secondary | ICD-10-CM

## 2017-06-08 DIAGNOSIS — R161 Splenomegaly, not elsewhere classified: Secondary | ICD-10-CM

## 2017-06-08 LAB — CBC
HEMATOCRIT: 30.9 % — AB (ref 39.0–52.0)
HEMOGLOBIN: 9.4 g/dL — AB (ref 13.0–17.0)
MCH: 23.6 pg — ABNORMAL LOW (ref 26.0–34.0)
MCHC: 30.4 g/dL (ref 30.0–36.0)
MCV: 77.6 fL — AB (ref 78.0–100.0)
Platelets: 127 10*3/uL — ABNORMAL LOW (ref 150–400)
RBC: 3.98 MIL/uL — ABNORMAL LOW (ref 4.22–5.81)
RDW: 14.9 % (ref 11.5–15.5)
WBC: 15.9 10*3/uL — AB (ref 4.0–10.5)

## 2017-06-08 LAB — GLUCOSE, CAPILLARY
GLUCOSE-CAPILLARY: 173 mg/dL — AB (ref 65–99)
GLUCOSE-CAPILLARY: 167 mg/dL — AB (ref 65–99)
GLUCOSE-CAPILLARY: 165 mg/dL — AB (ref 65–99)
Glucose-Capillary: 147 mg/dL — ABNORMAL HIGH (ref 65–99)
Glucose-Capillary: 175 mg/dL — ABNORMAL HIGH (ref 65–99)

## 2017-06-08 LAB — LIPID PANEL
CHOL/HDL RATIO: 9.7 ratio
Cholesterol: 97 mg/dL (ref 0–200)
HDL: 10 mg/dL — AB (ref >40)
LDL Cholesterol: 47 mg/dL (ref 0–99)
TRIGLYCERIDES: 200 mg/dL — AB (ref <150)
VLDL: 40 mg/dL (ref 0–40)

## 2017-06-08 LAB — COMPREHENSIVE METABOLIC PANEL
ALBUMIN: 2.1 g/dL — AB (ref 3.5–5.0)
ALT: 81 U/L — ABNORMAL HIGH (ref 17–63)
AST: 254 U/L — AB (ref 15–41)
Alkaline Phosphatase: 60 U/L (ref 38–126)
Anion gap: 9 (ref 5–15)
BILIRUBIN TOTAL: 4.7 mg/dL — AB (ref 0.3–1.2)
BUN: 17 mg/dL (ref 6–20)
CALCIUM: 7.7 mg/dL — AB (ref 8.9–10.3)
CO2: 20 mmol/L — ABNORMAL LOW (ref 22–32)
Chloride: 109 mmol/L (ref 101–111)
Creatinine, Ser: 0.93 mg/dL (ref 0.61–1.24)
GFR calc Af Amer: >60 mL/min (ref >60)
GLUCOSE: 148 mg/dL — AB (ref 65–99)
POTASSIUM: 4.3 mmol/L (ref 3.5–5.1)
Sodium: 138 mmol/L (ref 135–145)
TOTAL PROTEIN: 7 g/dL (ref 6.5–8.1)

## 2017-06-08 LAB — TROPONIN I
TROPONIN I: 0.03 ng/mL — AB (ref <0.03)
TROPONIN I: 0.06 ng/mL — AB (ref <0.03)
Troponin I: <0.03 ng/mL (ref <0.03)

## 2017-06-08 LAB — HEPATITIS C ANTIBODY

## 2017-06-08 MED ORDER — IOPAMIDOL (ISOVUE-300) INJECTION 61%
INTRAVENOUS | Status: AC
Start: 2017-06-08 — End: 2017-06-08
  Administered 2017-06-08: 75 mL
  Filled 2017-06-08: qty 75

## 2017-06-08 MED ORDER — METHYLPREDNISOLONE SODIUM SUCC 125 MG IJ SOLR
80.0000 mg | Freq: Two times a day (BID) | INTRAMUSCULAR | Status: DC
  Administered 2017-06-08 – 2017-06-10 (×5): 80 mg via INTRAVENOUS
  Filled 2017-06-08 (×5): qty 2

## 2017-06-08 MED ORDER — GLUCERNA 1.2 CAL PO LIQD
1500.0000 mL | ORAL | Status: DC
  Filled 2017-06-08: qty 1500

## 2017-06-08 MED ORDER — PNEUMOCOCCAL VAC POLYVALENT 25 MCG/0.5ML IJ INJ
0.5000 mL | INJECTION | INTRAMUSCULAR | Status: AC
  Administered 2017-06-09: 0.5 mL via INTRAMUSCULAR
  Filled 2017-06-08: qty 0.5

## 2017-06-08 MED ORDER — HYDROMORPHONE HCL 1 MG/ML IJ SOLN
0.5000 mg | INTRAMUSCULAR | Status: DC | PRN
  Administered 2017-06-08: 0.5 mg via INTRAVENOUS
  Administered 2017-06-08 – 2017-06-10 (×10): 1 mg via INTRAVENOUS
  Filled 2017-06-08 (×2): qty 1
  Filled 2017-06-08: qty 0.5
  Filled 2017-06-08 (×2): qty 1
  Filled 2017-06-08: qty 0.5
  Filled 2017-06-08 (×8): qty 1

## 2017-06-08 NOTE — Progress Notes (Signed)
   Subjective:    Patient ID: [name] [name], male    DOB: 1956-01-19, 61 y.o.   MRN: [id number]  HPI Swelling improved somewhat yesterday but was worse again by the end of the day.  His feeding tube came out yesterday.  He does not feel any better today.   Review of Systems     Objective:   Physical Exam Tm 101.2.  VSS Alert, NAD Continued marked right facial edema, tight, tender, right eyelids swollen closed still. WBC increasing.     Assessment & Plan:  Right acute parotitis with surrounding cellulitis I recommend repeating maxillofacial CT with contrast.  I wonder if ID has any further thoughts on antibiotic coverage.  Feeding tube to be replaced.

## 2017-06-08 NOTE — Progress Notes (Signed)
PROGRESS NOTE    [name] [name]  UXL:[id number] DOB: 01-03-1956 DOA: 05/21/2017 PCP: Tonye Pearsonoolittle, Robert P, MD (Inactive)   Brief Narrative:  61 year old AM ([place]) PMHx DM Type 2, HTN, Hepatitis C.   Friday 6/15 he noticed some minor R sided facial swelling. This continued to worsen in the following days and he developed associated fevers/chills, dyspnea, and dysphagia. He presented to urgent care 6/17 and was prescribed Clindamycin. He took his first dose, but was unable to take any subsequent doses due to dysphagia. [facility]. He had CT scan done which demonstrated right parotitis and facial cellulitis. Due to concern for airway compromise, PCCM asked to evaluate for admission.    Subjective: 6/22   Alert, follows all commands, pain in face. Negative SOB, negative CP. MAXIMUM TEMPERATURE last 24 hours 38.4C    Assessment & Plan:   Active Problems:   Parotitis, acute   Right Acute Parotitis w/ extensive facial cellulitis  -Poor dentition  No role for surgical intervention per ENT - keep hydrated and cont broad abx coverge  -Dr. Ollen BargesWessom Yacoub PCCM evaluate patient's airway (saw patient upon admission) secondary to concern of patient having difficulty protecting his airway/difficult intubation. Felt that patient's airway had not worsened since admission no intubation required at this time. -6/22 CT soft tissue neck shows extensive infection with rightward deviation of airway. See results below. Restart Solu-Medrol 80 mg BID (patient at risk of losing airway), requires close monitoring -Continue broad-spectrum antibiotic   HTN BP currently controlled  DM Type 2 uncontrolled with complication -Hemoglobin A1c pending -Lipid panel pending -Resistant SSI  Hepatitis C genotype 1 w/ splenomegaly - diagnoses 08/02/2015 -Unclear if pt has been evaluated for tx previously  - will benefit from [facility] after his  d/c  - has been advised to avoid EtOH use   Thrombocytopenia -Monitor closely currently no overt sign of bleeding   Nutrition -Pt unable to close mouth/lips and has done very poorly on bedside RN swallow eval  - swelling will clearly take 2+ additional days to improve significantly and pt has been npo for 2 days. Patient has lost CorTrak , given patient's tenuous airway will hold on replacing. -If facial swelling improved with high-dose steroids placed on 6/23       DVT prophylaxis: SCD Code Status: Full  Family Communication: Son present at bedside and explained plan of care Disposition Plan: TBD   Consultants:  PCCM ENT    Procedures/Significant Events:  6/22 CT soft tissue neck W contrast:-Extensive severe multi compartmental infectious/inflammatory process involving the right face from the right periorbital soft tissues to the upper neck. Inflammation involves the all right-sided deep cervical compartments including right parapharyngeal and retropharyngeal spaces. There multiple ill-defined fluid collections probably representing phlegmon. No discrete rim enhancing abscess is identified. 2. There is rightward displacement of the airway and moderate narrowing of the airway due to fluid collections in parapharyngeal space and swelling of oral and hypopharyngeal mucosa. 3. The origin of infection is uncertain, possibly odontogenic given the presence of odontogenic disease.   VENTILATOR SETTINGS: None   Cultures 6/18 blood NGTD 6/18 MRSA by PCR negative     Antimicrobials: Anti-infectives    Start     Stop   06/02/2017 1800  clindamycin (CLEOCIN) IVPB 600 mg         06/15/2017 1730  Ampicillin-Sulbactam (UNASYN) 3 g in sodium chloride 0.9 % 100 mL IVPB  05/31/2017 0945  clindamycin (CLEOCIN) IVPB 600 mg     05/30/2017 1019       Devices None  LINES / TUBES:      Continuous Infusions: . sodium chloride 75 mL/hr at 06/08/17 0710  .  ampicillin-sulbactam (UNASYN) IV Stopped (06/08/17 0512)  . clindamycin (CLEOCIN) IV 600 mg (06/08/17 1033)  . feeding supplement (GLUCERNA 1.2 CAL)       Objective: Vitals:   06/07/17 2330 06/08/17 0425 06/08/17 0700 06/08/17 0900  BP: (!) 146/64  (!) 149/71 (!) 142/74  Pulse: 64  66 63  Resp: 16  13 12   Temp:  (!) 101.2 F (38.4 C) 100.3 F (37.9 C)   TempSrc:  Axillary Oral   SpO2: 98%  96% 95%  Weight:  147 lb 0.8 oz (66.7 kg)    Height:  5' (1.524 m)      Intake/Output Summary (Last 24 hours) at 06/08/17 1101 Last data filed at 06/08/17 0425  Gross per 24 hour  Intake             1695 ml  Output              750 ml  Net              945 ml   Filed Weights   06/06/17 0437 06/07/17 0433 06/08/17 0425  Weight: 144 lb 13.5 oz (65.7 kg) 145 lb 15.1 oz (66.2 kg) 147 lb 0.8 oz (66.7 kg)    Examination:  General: Alert, follows all commands, pain in face No acute respiratory distress Eyes: right eye fully swollen shut, unable to open for evaluation, clear discharge inferior portion of eye.  negative scleral hemorrhage, negative icterus.  ENT: Negative Runny nose, negative gingival bleeding, poor dentation, positive swelling of the floor of the mouth, causing displacement of tongue outward for palate Neck:  Negative scars, masses, tSignificant facial swelling on the right from parotic gland hard to the touch, erythematous, warm to the touch, painful to the touch, tracks posterior right ear and under chin around to the left submandibular gland.  Lungs: Clear to auscultation bilaterally without wheezes or crackles Cardiovascular: Regular rate and rhythm without murmur gallop or rub normal S1 and S2 Abdomen: negative abdominal pain, nondistended, positive soft, bowel sounds, no rebound, no ascites, no appreciable mass Extremities: No significant cyanosis, clubbing, or edema bilateral lower extremities Skin: Positive erythematous face and neck, one to the touch Psychiatric:   Negative depression, negative anxiety, negative fatigue, negative mania  Central nervous system:  Cranial nerves II through XII intact, tongue/uvula midline, all extremities muscle strength 5/5, sensation intact throughout, positive dysarthria, negative expressive aphasia, negative receptive aphasia.  .     Data Reviewed: Care during the described time interval was provided by me .  I have reviewed this patient's available data, including medical history, events of note, physical examination, and all test results as part of my evaluation. I have personally reviewed and interpreted all radiology studies.  CBC:  Recent Labs Lab 06/03/2017 0939 06/09/2017 0957 06/05/17 0925 06/06/17 0608 06/07/17 0425 06/08/17 0712  WBC 20.9*  --  14.8* 13.3* 13.1* 15.9*  NEUTROABS 17.9*  --   --   --   --   --   HGB 12.1* 14.6 10.0* 8.9* 8.7* 9.4*  HCT 38.9* 43.0 32.5* 29.2* 28.9* 30.9*  MCV 77.8*  --  77.2* 75.8* 75.9* 77.6*  PLT 125*  --  106* 105* 128* 127*   Basic Metabolic Panel:  Recent  Labs Lab 06/10/2017 1940 06/05/17 0925 06/06/17 0322 06/07/17 0425 06/08/17 0712  NA 131* 135 135 140 138  K 4.2 4.1 4.0 3.6 4.3  CL 103 109 110 112* 109  CO2 18* 17* 17* 23 20*  GLUCOSE 303* 267* 212* 289* 148*  BUN 17 19 18 20 17   CREATININE 1.07 0.96 0.90 0.92 0.93  CALCIUM 7.8* 7.7* 7.5* 7.7* 7.7*  MG  --   --  2.0  --   --    GFR: Estimated Creatinine Clearance: 68.6 mL/min (by C-G formula based on SCr of 0.93 mg/dL). Liver Function Tests:  Recent Labs Lab 06/03/2017 1940 06/06/17 0322 06/07/17 0425 06/08/17 0712  AST 79* 50* 172* 254*  ALT 55 31 50 81*  ALKPHOS 65 52 59 60  BILITOT 4.6* 4.8* 5.8* 4.7*  PROT 7.7 7.0 6.9 7.0  ALBUMIN 2.6* 2.0* 2.0* 2.1*   No results for input(s): LIPASE, AMYLASE in the last 168 hours. No results for input(s): AMMONIA in the last 168 hours. Coagulation Profile: No results for input(s): INR, PROTIME in the last 168 hours. Cardiac Enzymes:  Recent  Labs Lab 06/08/17 0248 06/08/17 0712  TROPONINI 0.03* 0.06*   BNP (last 3 results) No results for input(s): PROBNP in the last 8760 hours. HbA1C: No results for input(s): HGBA1C in the last 72 hours. CBG:  Recent Labs Lab 06/07/17 1538 06/07/17 2018 06/07/17 2331 06/08/17 0422 06/08/17 0756  GLUCAP 174* 150* 139* 147* 173*   Lipid Profile: No results for input(s): CHOL, HDL, LDLCALC, TRIG, CHOLHDL, LDLDIRECT in the last 72 hours. Thyroid Function Tests: No results for input(s): TSH, T4TOTAL, FREET4, T3FREE, THYROIDAB in the last 72 hours. Anemia Panel: No results for input(s): VITAMINB12, FOLATE, FERRITIN, TIBC, IRON, RETICCTPCT in the last 72 hours. Urine analysis:    Component Value Date/Time   COLORURINE YELLOW 05/26/2017 1159   APPEARANCEUR CLEAR 06/10/2017 1159   LABSPEC 1.027 05/26/2017 1159   PHURINE 6.0 06/11/2017 1159   GLUCOSEU >=500 (A) 06/03/2017 1159   HGBUR SMALL (A) 05/27/2017 1159   BILIRUBINUR NEGATIVE 06/07/2017 1159   KETONESUR 20 (A) 06/13/2017 1159   PROTEINUR 100 (A) 06/06/2017 1159   UROBILINOGEN 0.2 09/14/2015 1226   NITRITE NEGATIVE 05/29/2017 1159   LEUKOCYTESUR LARGE (A) 06/11/2017 1159   Sepsis Labs: @LABRCNTIP (procalcitonin:4,lacticidven:4)  ) Recent Results (from the past 240 hour(s))  Blood Culture (routine x 2)     Status: None (Preliminary result)   Collection Time: 05/28/2017  9:38 AM  Result Value Ref Range Status   Specimen Description BLOOD RIGHT HAND  Final   Special Requests   Final    BOTTLES DRAWN AEROBIC AND ANAEROBIC Blood Culture adequate volume   Culture NO GROWTH 3 DAYS  Final   Report Status PENDING  Incomplete  Blood Culture (routine x 2)     Status: None (Preliminary result)   Collection Time: 06/16/2017  9:39 AM  Result Value Ref Range Status   Specimen Description BLOOD LEFT ANTECUBITAL  Final   Special Requests   Final    BOTTLES DRAWN AEROBIC AND ANAEROBIC Blood Culture adequate volume   Culture NO GROWTH 3  DAYS  Final   Report Status PENDING  Incomplete  MRSA PCR Screening     Status: None   Collection Time: 06/10/2017  6:01 PM  Result Value Ref Range Status   MRSA by PCR NEGATIVE NEGATIVE Final    Comment:        The GeneXpert MRSA Assay (FDA approved for NASAL specimens  only), is one component of a comprehensive MRSA colonization surveillance program. It is not intended to diagnose MRSA infection nor to guide or monitor treatment for MRSA infections.          Radiology Studies: Dg Chest Port 1 View  Result Date: 06/08/2017 CLINICAL DATA:  Acute chest pain. EXAM: PORTABLE CHEST 1 VIEW COMPARISON:  Radiographs 04/05/2015 FINDINGS: Left costophrenic angle and lung base not included in the field of view. Low lung volumes. Heart at the upper limits normal in size. Mediastinal contours are grossly normal. Probable right infrahilar atelectasis. No pulmonary edema. Limited assessment for pleural fluid. No pneumothorax. IMPRESSION: Low lung volumes with right basilar atelectasis. Upper normal heart size likely exaggerated by technique and low lung volumes. Left lung base and costophrenic angle not included in the field of view. Frontal and lateral views may be helpful if patient is able. Electronically Signed   By: Rubye Oaks M.D.   On: 06/08/2017 00:53        Scheduled Meds: . insulin aspart  0-20 Units Subcutaneous Q4H  . mouth rinse  15 mL Mouth Rinse BID  . pantoprazole (PROTONIX) IV  40 mg Intravenous QHS   Continuous Infusions: . sodium chloride 75 mL/hr at 06/08/17 0710  . ampicillin-sulbactam (UNASYN) IV Stopped (06/08/17 0512)  . clindamycin (CLEOCIN) IV 600 mg (06/08/17 1033)  . feeding supplement (GLUCERNA 1.2 CAL)       LOS: 4 days    Time spent: 40 minutes    Rehan Holness, Roselind Messier, MD [facility] Pager [phone]   If 7PM-7AM, please contact night-coverage [email or web address] Password [email or web address] 06/08/2017, 11:01 AM

## 2017-06-09 LAB — COMPREHENSIVE METABOLIC PANEL
ALBUMIN: 1.8 g/dL — AB (ref 3.5–5.0)
ALT: 58 U/L (ref 17–63)
ANION GAP: 8 (ref 5–15)
AST: 112 U/L — AB (ref 15–41)
Alkaline Phosphatase: 50 U/L (ref 38–126)
BILIRUBIN TOTAL: 5 mg/dL — AB (ref 0.3–1.2)
BUN: 17 mg/dL (ref 6–20)
CHLORIDE: 104 mmol/L (ref 101–111)
CO2: 23 mmol/L (ref 22–32)
Calcium: 7.5 mg/dL — ABNORMAL LOW (ref 8.9–10.3)
Creatinine, Ser: 0.86 mg/dL (ref 0.61–1.24)
GFR calc Af Amer: >60 mL/min (ref >60)
GFR calc non Af Amer: >60 mL/min (ref >60)
GLUCOSE: 224 mg/dL — AB (ref 65–99)
POTASSIUM: 3.9 mmol/L (ref 3.5–5.1)
Sodium: 135 mmol/L (ref 135–145)
TOTAL PROTEIN: 6.3 g/dL — AB (ref 6.5–8.1)

## 2017-06-09 LAB — GLUCOSE, CAPILLARY
GLUCOSE-CAPILLARY: 244 mg/dL — AB (ref 65–99)
GLUCOSE-CAPILLARY: 216 mg/dL — AB (ref 65–99)
GLUCOSE-CAPILLARY: 198 mg/dL — AB (ref 65–99)
GLUCOSE-CAPILLARY: 215 mg/dL — AB (ref 65–99)
Glucose-Capillary: 188 mg/dL — ABNORMAL HIGH (ref 65–99)
Glucose-Capillary: 218 mg/dL — ABNORMAL HIGH (ref 65–99)

## 2017-06-09 LAB — CULTURE, BLOOD (ROUTINE X 2)
CULTURE: NO GROWTH
Culture: NO GROWTH
Special Requests: ADEQUATE
Special Requests: ADEQUATE

## 2017-06-09 LAB — CBC
HEMATOCRIT: 26.9 % — AB (ref 39.0–52.0)
HEMOGLOBIN: 8.1 g/dL — AB (ref 13.0–17.0)
MCH: 22.9 pg — ABNORMAL LOW (ref 26.0–34.0)
MCHC: 30.1 g/dL (ref 30.0–36.0)
MCV: 76 fL — AB (ref 78.0–100.0)
Platelets: 108 10*3/uL — ABNORMAL LOW (ref 150–400)
RBC: 3.54 MIL/uL — ABNORMAL LOW (ref 4.22–5.81)
RDW: 14.1 % (ref 11.5–15.5)
WBC: 10.2 10*3/uL (ref 4.0–10.5)

## 2017-06-09 LAB — MAGNESIUM: MAGNESIUM: 1.9 mg/dL (ref 1.7–2.4)

## 2017-06-09 MED ORDER — PIPERACILLIN-TAZOBACTAM 3.375 G IVPB
3.3750 g | Freq: Three times a day (TID) | INTRAVENOUS | Status: DC
  Administered 2017-06-09 – 2017-06-13 (×12): 3.375 g via INTRAVENOUS
  Filled 2017-06-09 (×14): qty 50

## 2017-06-09 MED ORDER — VANCOMYCIN HCL IN DEXTROSE 750-5 MG/150ML-% IV SOLN
750.0000 mg | Freq: Two times a day (BID) | INTRAVENOUS | Status: DC
  Administered 2017-06-09 – 2017-06-12 (×6): 750 mg via INTRAVENOUS
  Filled 2017-06-09 (×7): qty 150

## 2017-06-09 MED ORDER — GLUCERNA 1.2 CAL PO LIQD
1500.0000 mL | ORAL | Status: DC
  Administered 2017-06-10: 1000 mL
  Administered 2017-06-10: 1500 mL
  Filled 2017-06-09 (×2): qty 1500

## 2017-06-09 MED ORDER — INSULIN GLARGINE 100 UNIT/ML ~~LOC~~ SOLN
10.0000 [IU] | Freq: Every day | SUBCUTANEOUS | Status: DC
  Administered 2017-06-09 – 2017-06-10 (×2): 10 [IU] via SUBCUTANEOUS
  Filled 2017-06-09 (×2): qty 0.1

## 2017-06-09 MED ORDER — PIPERACILLIN-TAZOBACTAM 3.375 G IVPB 30 MIN: 3.3750 g | Freq: Three times a day (TID) | INTRAVENOUS | Status: DC

## 2017-06-09 MED ORDER — SODIUM CHLORIDE 0.9 % IV SOLN
1500.0000 mg | Freq: Once | INTRAVENOUS | Status: AC
  Administered 2017-06-09: 1500 mg via INTRAVENOUS
  Filled 2017-06-09: qty 1500

## 2017-06-09 NOTE — Progress Notes (Signed)
   Subjective:    Patient ID: [name] [name], male    DOB: 1956/07/12, 61 y.o.   MRN: [id number]  HPI Feels about the same.  Still cannot take diet by mouth.  Feeding tube not replaced.  Review of Systems     Objective:   Physical Exam Tm 100.7 VSS Alert, NAD Continued marked, tight, tender edema of right face, eyelids, and upper neck.  Eyelids with some minor excoriation.  Unable to close mouth.     Assessment & Plan:  Right parotitis and facial cellulitis  I personally reviewed yesterday's neck CT demonstrating continued cellulitis/phlegmon changes to the right facial and upper neck soft tissues.  An odontogenic source was further speculated.  There remains no defined, drainable abscess.  While his exam has not improved yet, his temperature did not spike as high and his WBC is normal.  Continue wide spectrum antibiotics.  Further ID recommendations?  He will need an oral surgery consultation ultimately to consider dental extraction.

## 2017-06-09 NOTE — Progress Notes (Signed)
Cortrak Tube Team Note:  Consult received to place a Cortrak feeding tube.   A 10 F Cortrak tube was placed in the Left nare and secured with a nasal bridle at 65 cm. Per the Cortrak monitor reading the tube tip is distal stomach  No x-ray is required. RN may begin using tube.   Resume Prior TF Regimen  If the tube becomes dislodged please keep the tube and contact the Cortrak team at [email or web address] (password [email or web address]) for replacement.   If after hours and replacement cannot be delayed, place a NG tube and confirm placement with an abdominal x-ray.   Christophe LouisNathan Franks RD, LDN, CNSC Clinical Nutrition Pager: [phone] 06/09/2017 2:15 PM

## 2017-06-09 NOTE — Progress Notes (Signed)
[place] TEAM 1 - Stepdown/ICU TEAM  [name] [name]  ZOX:[id number] DOB: June 10, 1956 DOA: July 04, 2017 PCP: Tonye Pearson, MD (Inactive)    Brief Narrative:  61 year old male with hx DM, HTN, and Hepatitis C who first noticed some minor R sided facial swelling on 06/01/17. This continued to worsen and he developed associated fevers/chills, dyspnea, and dysphagia. He presented to an urgent care 6/17 and was prescribed Clindamycin. He took his first dose, but was unable to take any subsequent doses due to dysphagia. He presented to [facility] ED 6/18 when a CT demonstrated right parotitis and facial cellulitis.   Significant Events: 6/18 admit to ICU for R parotitis 6/20 [facility] assumed care   Subjective: The patient denies any shortness of breath or difficulty breathing.  He is very hungry.  He denies chest pain nausea vomiting or abdominal pain.  Assessment & Plan:  Right Acute Parotitis w/ extensive facial cellulitis - Poor dentition  No role for surgical intervention per ENT - cont broad abx coverge - steroids resumed as concern developed over possible airway compromise - no evidence of airway compromise on exam today  HTN BP reasonably controlled at this time  DM2 CBG not at goal - adjust insulin again and follow  Hepatitis C genotype 1 w/ splenomegaly - diagnoses 08/02/2015 Unclear if pt has been evaluated for tx previously - will benefit from outpt f/u at [facility] for ID after his d/c - has been advised to avoid EtOH use - confirmed + test result on 06/07/17  Nutrition Pt unable to close mouth/lips and has done very poorly on bedside RN swallow eval - Courtright was accidentally removed and the decision was made to not replace and 6/22 due to concerns for respiratory compromise - today the patient is stable and very hungry and I feel it is safe to attempt to replace the cortrak    DVT prophylaxis: SCDs in setting of thrombocytopenia  Code Status: FULL CODE Family Communication:  Spoke with multiple family members at bedside  Disposition Plan: SDU for close monitoring of airway protection   Consultants:  PCCM ENT  Antimicrobials:  Clindamycin 6/18 > Unasyn 6/18 > Vancomycin 6/23 >  Objective: Blood pressure 133/80, pulse (!) 57, temperature 97.7 F (36.5 C), temperature source Axillary, resp. rate 13, height 5' (1.524 m), weight 67.8 kg (149 lb 7.6 oz), SpO2 99 %.  Intake/Output Summary (Last 24 hours) at 06/09/17 0940 Last data filed at 06/09/17 8119  Gross per 24 hour  Intake             1875 ml  Output              800 ml  Net             1075 ml   Filed Weights   06/07/17 0433 06/08/17 0425 06/09/17 0600  Weight: 66.2 kg (145 lb 15.1 oz) 66.7 kg (147 lb 0.8 oz) 67.8 kg (149 lb 7.6 oz)    Examination: General: No acute respiratory distress - alert and conversant  HEENT:  Severe R facial swelling w/o appreciable change - unable to open R eye - L w/ only minimal swelling - still able to speak and swallow secretions  Lungs: Clear to auscultation bilaterally - no wheezing  Cardiovascular: RRR w/o M Abdomen: Nontender, nondistended, soft, bowel sounds positive, no rebound Extremities: No edema bilateral lower extremities  CBC:  Recent Labs Lab 04-Jul-2017 1478  06/05/17 2956 06/06/17 2130 06/07/17 0425 06/08/17 8657 06/09/17 0405  WBC 20.9*  --  14.8* 13.3* 13.1* 15.9* 10.2  NEUTROABS 17.9*  --   --   --   --   --   --   HGB 12.1*  < > 10.0* 8.9* 8.7* 9.4* 8.1*  HCT 38.9*  < > 32.5* 29.2* 28.9* 30.9* 26.9*  MCV 77.8*  --  77.2* 75.8* 75.9* 77.6* 76.0*  PLT 125*  --  106* 105* 128* 127* 108*  < > = values in this interval not displayed. Basic Metabolic Panel:  Recent Labs Lab 06/05/17 0925 06/06/17 0322 06/07/17 0425 06/08/17 0712 06/09/17 0405  NA 135 135 140 138 135  K 4.1 4.0 3.6 4.3 3.9  CL 109 110 112* 109 104  CO2 17* 17* 23 20* 23  GLUCOSE 267* 212* 289* 148* 224*  BUN 19 18 20 17 17   CREATININE 0.96 0.90 0.92 0.93 0.86    CALCIUM 7.7* 7.5* 7.7* 7.7* 7.5*  MG  --  2.0  --   --  1.9   GFR: Estimated Creatinine Clearance: 74.7 mL/min (by C-G formula based on SCr of 0.86 mg/dL).  Liver Function Tests:  Recent Labs Lab 05/27/2017 1940 06/06/17 0322 06/07/17 0425 06/08/17 0712 06/09/17 0405  AST 79* 50* 172* 254* 112*  ALT 55 31 50 81* 58  ALKPHOS 65 52 59 60 50  BILITOT 4.6* 4.8* 5.8* 4.7* 5.0*  PROT 7.7 7.0 6.9 7.0 6.3*  ALBUMIN 2.6* 2.0* 2.0* 2.1* 1.8*    HbA1C: Hemoglobin A1C  Date/Time Value Ref Range Status  04/05/2015 07:34 PM 6.3  Final  07/28/2014 08:55 PM 8.0  Final   Hgb A1c MFr Bld  Date/Time Value Ref Range Status  09/11/2015 05:34 AM 7.4 (H) 4.8 - 5.6 % Final    Comment:    (NOTE)         Pre-diabetes: 5.7 - 6.4         Diabetes: >6.4         Glycemic control for adults with diabetes: <7.0     CBG:  Recent Labs Lab 06/08/17 1622 06/08/17 1937 06/09/17 0000 06/09/17 0333 06/09/17 0836  GLUCAP 165* 175* 188* 244* 218*    Recent Results (from the past 240 hour(s))  Blood Culture (routine x 2)     Status: None   Collection Time: 05/28/2017  9:38 AM  Result Value Ref Range Status   Specimen Description BLOOD RIGHT HAND  Final   Special Requests   Final    BOTTLES DRAWN AEROBIC AND ANAEROBIC Blood Culture adequate volume   Culture NO GROWTH 5 DAYS  Final   Report Status 06/09/2017 FINAL  Final  Blood Culture (routine x 2)     Status: None   Collection Time: 06/08/2017  9:39 AM  Result Value Ref Range Status   Specimen Description BLOOD LEFT ANTECUBITAL  Final   Special Requests   Final    BOTTLES DRAWN AEROBIC AND ANAEROBIC Blood Culture adequate volume   Culture NO GROWTH 5 DAYS  Final   Report Status 06/09/2017 FINAL  Final  MRSA PCR Screening     Status: None   Collection Time: 05/25/2017  6:01 PM  Result Value Ref Range Status   MRSA by PCR NEGATIVE NEGATIVE Final    Comment:        The GeneXpert MRSA Assay (FDA approved for NASAL specimens only), is one  component of a comprehensive MRSA colonization surveillance program. It is not intended to diagnose MRSA infection nor to guide or monitor treatment  for MRSA infections.      Scheduled Meds: . insulin aspart  0-20 Units Subcutaneous Q4H  . mouth rinse  15 mL Mouth Rinse BID  . methylPREDNISolone (SOLU-MEDROL) injection  80 mg Intravenous Q12H  . pantoprazole (PROTONIX) IV  40 mg Intravenous QHS     LOS: 5 days   Lonia BloodJeffrey T. McClung, MD [facility] Office  [phone] Pager - Text Page per Amion as per below:  On-Call/Text Page:      [email or web address]      password [email or web address]  If 7PM-7AM, please contact night-coverage [email or web address] Password [email or web address] 06/09/2017, 9:40 AM

## 2017-06-09 NOTE — Progress Notes (Signed)
Pt. consistently brady in the 50s and occasionally high 40s. Asymptomatic. Notified M. Lynch, NP and given orders to just monitor for now.

## 2017-06-09 NOTE — Progress Notes (Signed)
Pharmacy Antibiotic Note  Jerrod [name] is a 61 y.o. male admitted on 05/30/2017 with diffuse facial swelling and cellulitis, with concerns of R-sided parotitis. CT findings correlate with parotitis. Tmax 100.61F, WBC improving to 10.2, SCr stable 0.86. -6/18 CT neck: No drainable abscess -6/23 No improvement, continued fevers, escalated to vancomycin  Plan: -Stop unasyn and clindamycin -Vanc 1500mg  x1, then 750mg  q12h -Monitor renal fx, cultures, duration of therapy  Height: 5' (152.4 cm) Weight: 149 lb 7.6 oz (67.8 kg) IBW/kg (Calculated) : 50  Temp (24hrs), Avg:98.8 F (37.1 C), Min:97.5 F (36.4 C), Max:100.7 F (38.2 C)   Recent Labs Lab 06/02/2017 0958 06/01/2017 1707 06/08/2017 1940 06/05/17 0925 06/05/17 1126 06/06/17 0322 06/06/17 0608 06/07/17 0425 06/08/17 0712 06/09/17 0405  WBC  --   --   --  14.8*  --   --  13.3* 13.1* 15.9* 10.2  CREATININE  --   --  1.07 0.96  --  0.90  --  0.92 0.93 0.86  LATICACIDVEN 5.26* 3.66* 3.8*  --  2.2*  --   --   --   --   --     Estimated Creatinine Clearance: 74.7 mL/min (by C-G formula based on SCr of 0.86 mg/dL).    No Known Allergies  6/18 Unasyn >6/23 6/18 Clinda >6/23 6/23 Vanc>  6/18 blood cx: ngtd 6/18 HIV neg 6/18 MRSA neg  Gwyndolyn KaufmanKai Teriann Livingood Bernette Redbird(Kenny), PharmD  [id number] Pharmacy Resident Pager: [phone] 06/09/2017 10:17 AM

## 2017-06-10 DIAGNOSIS — E876 Hypokalemia: Secondary | ICD-10-CM

## 2017-06-10 DIAGNOSIS — L03211 Cellulitis of face: Secondary | ICD-10-CM

## 2017-06-10 LAB — CBC
HEMATOCRIT: 32 % — AB (ref 39.0–52.0)
Hemoglobin: 9.9 g/dL — ABNORMAL LOW (ref 13.0–17.0)
MCH: 23 pg — AB (ref 26.0–34.0)
MCHC: 30.9 g/dL (ref 30.0–36.0)
MCV: 74.4 fL — AB (ref 78.0–100.0)
PLATELETS: 164 10*3/uL (ref 150–400)
RBC: 4.3 MIL/uL (ref 4.22–5.81)
RDW: 15 % (ref 11.5–15.5)
WBC: 17 10*3/uL — AB (ref 4.0–10.5)

## 2017-06-10 LAB — GLUCOSE, CAPILLARY
GLUCOSE-CAPILLARY: 258 mg/dL — AB (ref 65–99)
GLUCOSE-CAPILLARY: 282 mg/dL — AB (ref 65–99)
GLUCOSE-CAPILLARY: 291 mg/dL — AB (ref 65–99)
GLUCOSE-CAPILLARY: 215 mg/dL — AB (ref 65–99)
Glucose-Capillary: 275 mg/dL — ABNORMAL HIGH (ref 65–99)
Glucose-Capillary: 287 mg/dL — ABNORMAL HIGH (ref 65–99)

## 2017-06-10 LAB — BASIC METABOLIC PANEL
Anion gap: 6 (ref 5–15)
BUN: 17 mg/dL (ref 6–20)
CHLORIDE: 106 mmol/L (ref 101–111)
CO2: 22 mmol/L (ref 22–32)
CREATININE: 0.73 mg/dL (ref 0.61–1.24)
Calcium: 7.7 mg/dL — ABNORMAL LOW (ref 8.9–10.3)
GFR calc non Af Amer: >60 mL/min (ref >60)
Glucose, Bld: 290 mg/dL — ABNORMAL HIGH (ref 65–99)
POTASSIUM: 3.4 mmol/L — AB (ref 3.5–5.1)
Sodium: 134 mmol/L — ABNORMAL LOW (ref 135–145)

## 2017-06-10 LAB — HEMOGLOBIN A1C
HEMOGLOBIN A1C: 10.6 % — AB (ref 4.8–5.6)
MEAN PLASMA GLUCOSE: 258 mg/dL

## 2017-06-10 MED ORDER — PANTOPRAZOLE SODIUM 40 MG PO PACK
40.0000 mg | PACK | Freq: Every day | ORAL | Status: DC
  Administered 2017-06-10 – 2017-06-25 (×14): 40 mg
  Filled 2017-06-10 (×15): qty 20

## 2017-06-10 MED ORDER — HYDROMORPHONE HCL 1 MG/ML IJ SOLN
0.5000 mg | INTRAMUSCULAR | Status: DC | PRN
  Administered 2017-06-10 – 2017-06-11 (×2): 1 mg via INTRAVENOUS
  Filled 2017-06-10 (×2): qty 1

## 2017-06-10 MED ORDER — ACETAMINOPHEN 160 MG/5ML PO SOLN
650.0000 mg | Freq: Four times a day (QID) | ORAL | Status: DC | PRN
  Administered 2017-06-12 – 2017-06-18 (×7): 650 mg
  Filled 2017-06-10 (×9): qty 20.3

## 2017-06-10 MED ORDER — INSULIN GLARGINE 100 UNIT/ML ~~LOC~~ SOLN: 10.0000 [IU] | Freq: Two times a day (BID) | SUBCUTANEOUS | Status: DC

## 2017-06-10 MED ORDER — INSULIN GLARGINE 100 UNIT/ML ~~LOC~~ SOLN
14.0000 [IU] | Freq: Two times a day (BID) | SUBCUTANEOUS | Status: DC
  Administered 2017-06-10 – 2017-06-11 (×2): 14 [IU] via SUBCUTANEOUS
  Filled 2017-06-10 (×2): qty 0.14

## 2017-06-10 MED ORDER — METHYLPREDNISOLONE SODIUM SUCC 125 MG IJ SOLR
60.0000 mg | Freq: Two times a day (BID) | INTRAMUSCULAR | Status: DC
  Administered 2017-06-11 – 2017-06-13 (×4): 60 mg via INTRAVENOUS
  Filled 2017-06-10 (×4): qty 2

## 2017-06-10 MED ORDER — MUPIROCIN 2 % EX OINT
TOPICAL_OINTMENT | Freq: Two times a day (BID) | CUTANEOUS | Status: DC
  Administered 2017-06-10: 1 via TOPICAL
  Administered 2017-06-10 – 2017-06-16 (×11): via TOPICAL
  Administered 2017-06-16 – 2017-06-18 (×3): 1 via TOPICAL
  Administered 2017-06-18: 21:00:00 via TOPICAL
  Filled 2017-06-10 (×3): qty 22

## 2017-06-10 MED ORDER — POTASSIUM CHLORIDE 20 MEQ/15ML (10%) PO SOLN
40.0000 meq | Freq: Once | ORAL | Status: AC
  Administered 2017-06-10: 40 meq
  Filled 2017-06-10: qty 30

## 2017-06-10 MED ORDER — OXYCODONE HCL 5 MG/5ML PO SOLN
5.0000 mg | ORAL | Status: DC | PRN
Start: 2017-06-10 — End: 2017-06-11
  Administered 2017-06-10 – 2017-06-11 (×3): 10 mg
  Filled 2017-06-10 (×3): qty 10

## 2017-06-10 NOTE — Progress Notes (Signed)
[facility] TEAM 1 - Stepdown/ICU TEAM  [name] [name]  VFI:[id number] DOB: [birth year] DOA: 05/29/2017 PCP: Tonye Pearson, MD (Inactive)    Brief Narrative:  61 year old male with hx DM, HTN, and Hepatitis C who first noticed some minor R sided facial swelling on 06/01/17. This continued to worsen and he developed associated fevers/chills, dyspnea, and dysphagia. He presented to an urgent care 6/17 and was prescribed Clindamycin. He took his first dose, but was unable to take any subsequent doses due to dysphagia. He presented to [facility] ED 6/18 when a CT demonstrated right parotitis and facial cellulitis.   Significant Events: 6/18 admit to ICU for R parotitis 6/20 [facility] assumed care  6/23 Vanc added   Subjective: Though the pt and his wife respond appropriately to questions and display evidence of understanding simple directed conversations, concern has been raised that they are not fully comprehending the current situation.  As a result his RN today has been diligently working to find a Nurse, learning disability for their very specific dialect.  Hopefully this person will be available tomorrow.   In my conversation w/ the pt and his wife he reports he is feeling about the same.  He does c/o some intermittent HA.  No sob or chest pain today.    Assessment & Plan:  Right Acute Parotitis w/ extensive facial cellulitis - Poor dentition  No role for surgical intervention thus far per ENT - cont broad abx coverage, w/ Vanc added 6/23 - steroids resumed as concern developed over possible airway compromise - repeat CT in AM to r/o abscess formation as suggested by ENT - transition to per tube pain meds now that cortrak back in   HTN BP reasonably controlled at this time given current clinical situation   DM2 CBG remains signif elevated - strict control a must in setting of severe infection - increase insulin tx w/ resumption of tube feeding and use of steroids   Hepatitis C genotype 1 w/ splenomegaly -  diagnoses 08/02/2015 Unclear if pt has been evaluated for tx previously - will benefit from outpt f/u at [facility] for ID after his d/c - has been advised to avoid EtOH use - confirmed + test result on 06/07/17  Nutrition cortrak replaced 6/23 - tube feeding resumed    DVT prophylaxis: SCDs in setting of thrombocytopenia  Code Status: FULL CODE Family Communication: Spoke with wife at bedside  Disposition Plan: SDU for close monitoring of airway protection   Consultants:  PCCM ENT  Antimicrobials:  Clindamycin 6/18 > 6/23 Unasyn 6/18 > 6/22 Vancomycin 6/23 > Zosyn 6/23 >  Objective: Blood pressure (!) 101/56, pulse 60, temperature (!) 96.5 F (35.8 C), temperature source Axillary, resp. rate 17, height 5' (1.524 m), weight 67.8 kg (149 lb 7.6 oz), SpO2 98 %.  Intake/Output Summary (Last 24 hours) at 06/10/17 1554 Last data filed at 06/10/17 0100  Gross per 24 hour  Intake             1600 ml  Output              800 ml  Net              800 ml   Filed Weights   06/08/17 0425 06/09/17 0600 06/10/17 0500  Weight: 66.7 kg (147 lb 0.8 oz) 67.8 kg (149 lb 7.6 oz) 67.8 kg (149 lb 7.6 oz)    Examination: General: No acute respiratory distress - alert  HEENT:  Severe R facial  swelling w/ no appreciable change - unable to open R eye - L w/ only minimal swelling - able to speak and swallow secretions  Lungs: no wheezing - good air movement th/o B fields  Cardiovascular: RRR w/o M or gallup  Abdomen: Nontender, nondistended, soft, bowel sounds positive, no rebound Extremities: No edema B LE   CBC:  Recent Labs Lab 06/15/2017 0939  06/06/17 0608 06/07/17 0425 06/08/17 0712 06/09/17 0405 06/10/17 0318  WBC 20.9*  < > 13.3* 13.1* 15.9* 10.2 17.0*  NEUTROABS 17.9*  --   --   --   --   --   --   HGB 12.1*  < > 8.9* 8.7* 9.4* 8.1* 9.9*  HCT 38.9*  < > 29.2* 28.9* 30.9* 26.9* 32.0*  MCV 77.8*  < > 75.8* 75.9* 77.6* 76.0* 74.4*  PLT 125*  < > 105* 128* 127* 108* 164  < >  = values in this interval not displayed. Basic Metabolic Panel:  Recent Labs Lab 06/06/17 0322 06/07/17 0425 06/08/17 0712 06/09/17 0405 06/10/17 0318  NA 135 140 138 135 134*  K 4.0 3.6 4.3 3.9 3.4*  CL 110 112* 109 104 106  CO2 17* 23 20* 23 22  GLUCOSE 212* 289* 148* 224* 290*  BUN 18 20 17 17 17   CREATININE 0.90 0.92 0.93 0.86 0.73  CALCIUM 7.5* 7.7* 7.7* 7.5* 7.7*  MG 2.0  --   --  1.9  --    GFR: Estimated Creatinine Clearance: 80.3 mL/min (by C-G formula based on SCr of 0.73 mg/dL).  Liver Function Tests:  Recent Labs Lab 05/19/2017 1940 06/06/17 0322 06/07/17 0425 06/08/17 0712 06/09/17 0405  AST 79* 50* 172* 254* 112*  ALT 55 31 50 81* 58  ALKPHOS 65 52 59 60 50  BILITOT 4.6* 4.8* 5.8* 4.7* 5.0*  PROT 7.7 7.0 6.9 7.0 6.3*  ALBUMIN 2.6* 2.0* 2.0* 2.1* 1.8*    HbA1C: Hgb A1c MFr Bld  Date/Time Value Ref Range Status  06/08/2017 03:00 PM 10.6 (H) 4.8 - 5.6 % Final    Comment:    (NOTE)         Pre-diabetes: 5.7 - 6.4         Diabetes: >6.4         Glycemic control for adults with diabetes: <7.0   09/11/2015 05:34 AM 7.4 (H) 4.8 - 5.6 % Final    Comment:    (NOTE)         Pre-diabetes: 5.7 - 6.4         Diabetes: >6.4         Glycemic control for adults with diabetes: <7.0     CBG:  Recent Labs Lab 06/09/17 1928 06/09/17 2346 06/10/17 0445 06/10/17 0819 06/10/17 1210  GLUCAP 215* 275* 258* 282* 287*    Recent Results (from the past 240 hour(s))  Blood Culture (routine x 2)     Status: None   Collection Time: 05/28/2017  9:38 AM  Result Value Ref Range Status   Specimen Description BLOOD RIGHT HAND  Final   Special Requests   Final    BOTTLES DRAWN AEROBIC AND ANAEROBIC Blood Culture adequate volume   Culture NO GROWTH 5 DAYS  Final   Report Status 06/09/2017 FINAL  Final  Blood Culture (routine x 2)     Status: None   Collection Time: 05/24/2017  9:39 AM  Result Value Ref Range Status   Specimen Description BLOOD LEFT ANTECUBITAL   Final   Special  Requests   Final    BOTTLES DRAWN AEROBIC AND ANAEROBIC Blood Culture adequate volume   Culture NO GROWTH 5 DAYS  Final   Report Status 06/09/2017 FINAL  Final  MRSA PCR Screening     Status: None   Collection Time: 05/25/2017  6:01 PM  Result Value Ref Range Status   MRSA by PCR NEGATIVE NEGATIVE Final    Comment:        The GeneXpert MRSA Assay (FDA approved for NASAL specimens only), is one component of a comprehensive MRSA colonization surveillance program. It is not intended to diagnose MRSA infection nor to guide or monitor treatment for MRSA infections.      Scheduled Meds: . insulin aspart  0-20 Units Subcutaneous Q4H  . insulin glargine  10 Units Subcutaneous Daily  . mouth rinse  15 mL Mouth Rinse BID  . methylPREDNISolone (SOLU-MEDROL) injection  80 mg Intravenous Q12H  . mupirocin ointment   Topical BID  . pantoprazole (PROTONIX) IV  40 mg Intravenous QHS     LOS: 6 days   Lonia Blood, MD [facility] Office  [phone] Pager - Text Page per Amion as per below:  On-Call/Text Page:      [email or web address]      password [email or web address]  If 7PM-7AM, please contact night-coverage [email or web address] Password [facility] 06/10/2017, 3:54 PM

## 2017-06-10 NOTE — Progress Notes (Signed)
Received phone call from Walden FieldCarol Bayer [phone], pt's employer and community resource who would like updates. She also stated concern of pt and wife not fully understanding MD and RN updates.This RN attempted to talk with pt's wife for clarification, wife was unable to understand. Environmental Services came up to [facility] and stated pt speaks "ProofreaderBahnar" language. There is not interpreter for this language. There is a community person who interprets for patients Stierwalt Boan [phone]. I have attempted to call several times without response. I will attempt to talk with wife to see if she will be able to bring the community person for a meeting 05/23/2017 with MDs. MD/McClung updated and acknowledges plan, nursing will continue to monitor.

## 2017-06-10 NOTE — Progress Notes (Signed)
   Subjective:    Patient ID: [name] [name], male    DOB: [birth year], 61 y.o.   MRN: [id number]  HPI Feeding tube in place.  Face does not feel any better.  No breathing problems.  Review of Systems     Objective:   Physical Exam AF VSS Alert, NAD Marked right facial, eyelid, neck edema.  Firm, tender.  No better.  Submental region with firm edema.  Unable to open right eye.  Cannot close mouth.    Assessment & Plan:  Right extensive facial cellulitis/parotitis, possible odontogenic source  Still no clear improvement on exam.  He continues on broad spectrum antibiotics.  Vancomycin added.  Recommend another CT with contrast tomorrow to look for a drainable abscess.

## 2017-06-11 ENCOUNTER — Encounter (HOSPITAL_COMMUNITY): Admission: EM | Disposition: E | Payer: Self-pay | Source: Home / Self Care | Attending: Pulmonary Disease

## 2017-06-11 ENCOUNTER — Inpatient Hospital Stay (HOSPITAL_COMMUNITY): Payer: 59

## 2017-06-11 ENCOUNTER — Inpatient Hospital Stay (HOSPITAL_COMMUNITY): Payer: 59 | Admitting: Certified Registered"

## 2017-06-11 ENCOUNTER — Encounter (HOSPITAL_COMMUNITY): Payer: Self-pay | Admitting: Certified Registered"

## 2017-06-11 DIAGNOSIS — E1165 Type 2 diabetes mellitus with hyperglycemia: Secondary | ICD-10-CM

## 2017-06-11 DIAGNOSIS — L03211 Cellulitis of face: Secondary | ICD-10-CM

## 2017-06-11 HISTORY — PX: INCISION AND DRAINAGE ABSCESS: SHX5864

## 2017-06-11 HISTORY — PX: TRACHEOSTOMY TUBE PLACEMENT: SHX814

## 2017-06-11 LAB — GLUCOSE, CAPILLARY
GLUCOSE-CAPILLARY: 281 mg/dL — AB (ref 65–99)
GLUCOSE-CAPILLARY: 241 mg/dL — AB (ref 65–99)
GLUCOSE-CAPILLARY: 196 mg/dL — AB (ref 65–99)
GLUCOSE-CAPILLARY: 203 mg/dL — AB (ref 65–99)
Glucose-Capillary: 166 mg/dL — ABNORMAL HIGH (ref 65–99)
Glucose-Capillary: 206 mg/dL — ABNORMAL HIGH (ref 65–99)
Glucose-Capillary: 233 mg/dL — ABNORMAL HIGH (ref 65–99)

## 2017-06-11 LAB — CBC WITH DIFFERENTIAL/PLATELET
BASOS ABS: 0 10*3/uL (ref 0.0–0.1)
Basophils Relative: 0 %
Eosinophils Absolute: 0 10*3/uL (ref 0.0–0.7)
Eosinophils Relative: 0 %
HCT: 30.2 % — ABNORMAL LOW (ref 39.0–52.0)
Hemoglobin: 9.2 g/dL — ABNORMAL LOW (ref 13.0–17.0)
LYMPHS PCT: 5 %
Lymphs Abs: 0.7 10*3/uL (ref 0.7–4.0)
MCH: 22.9 pg — AB (ref 26.0–34.0)
MCHC: 30.5 g/dL (ref 30.0–36.0)
MCV: 75.1 fL — ABNORMAL LOW (ref 78.0–100.0)
Monocytes Absolute: 0.9 10*3/uL (ref 0.1–1.0)
Monocytes Relative: 6 %
NEUTROS ABS: 12.7 10*3/uL — AB (ref 1.7–7.7)
NEUTROS PCT: 89 %
PLATELETS: 161 10*3/uL (ref 150–400)
RBC: 4.02 MIL/uL — AB (ref 4.22–5.81)
RDW: 15.1 % (ref 11.5–15.5)
WBC: 14.3 10*3/uL — AB (ref 4.0–10.5)

## 2017-06-11 LAB — COMPREHENSIVE METABOLIC PANEL
ALBUMIN: 1.8 g/dL — AB (ref 3.5–5.0)
ALT: 40 U/L (ref 17–63)
AST: 57 U/L — ABNORMAL HIGH (ref 15–41)
Alkaline Phosphatase: 96 U/L (ref 38–126)
Anion gap: 7 (ref 5–15)
BUN: 15 mg/dL (ref 6–20)
CHLORIDE: 108 mmol/L (ref 101–111)
CO2: 24 mmol/L (ref 22–32)
Calcium: 7.9 mg/dL — ABNORMAL LOW (ref 8.9–10.3)
Creatinine, Ser: 0.75 mg/dL (ref 0.61–1.24)
GFR calc non Af Amer: >60 mL/min (ref >60)
Glucose, Bld: 253 mg/dL — ABNORMAL HIGH (ref 65–99)
Potassium: 3.7 mmol/L (ref 3.5–5.1)
SODIUM: 139 mmol/L (ref 135–145)
Total Bilirubin: 3 mg/dL — ABNORMAL HIGH (ref 0.3–1.2)
Total Protein: 6.3 g/dL — ABNORMAL LOW (ref 6.5–8.1)

## 2017-06-11 LAB — SURGICAL PCR SCREEN
MRSA, PCR: NEGATIVE
Staphylococcus aureus: NEGATIVE

## 2017-06-11 SURGERY — INCISION AND DRAINAGE, ABSCESS
Anesthesia: General | Site: Neck | Laterality: Right

## 2017-06-11 MED ORDER — SODIUM CHLORIDE 0.9 % IR SOLN
Status: DC | PRN
Start: 2017-06-11 — End: 2017-06-11
  Administered 2017-06-11: 1000 mL

## 2017-06-11 MED ORDER — LIDOCAINE-EPINEPHRINE 1 %-1:100000 IJ SOLN
INTRAMUSCULAR | Status: DC | PRN
  Administered 2017-06-11: 20 mL

## 2017-06-11 MED ORDER — PROPOFOL 10 MG/ML IV BOLUS
INTRAVENOUS | Status: DC | PRN
  Administered 2017-06-11: 60 mg via INTRAVENOUS

## 2017-06-11 MED ORDER — DEXMEDETOMIDINE HCL IN NACL 200 MCG/50ML IV SOLN
INTRAVENOUS | Status: AC
  Filled 2017-06-11: qty 50

## 2017-06-11 MED ORDER — ONDANSETRON HCL 4 MG/2ML IJ SOLN
INTRAMUSCULAR | Status: AC
  Filled 2017-06-11: qty 2

## 2017-06-11 MED ORDER — FENTANYL CITRATE (PF) 100 MCG/2ML IJ SOLN: 100.0000 ug | INTRAMUSCULAR | Status: DC | PRN

## 2017-06-11 MED ORDER — DEXMEDETOMIDINE HCL IN NACL 200 MCG/50ML IV SOLN
INTRAVENOUS | Status: DC | PRN
  Administered 2017-06-11: 12 ug via INTRAVENOUS
  Administered 2017-06-11: 20 ug via INTRAVENOUS
  Administered 2017-06-11: 8 ug via INTRAVENOUS
  Administered 2017-06-11: 20 ug via INTRAVENOUS

## 2017-06-11 MED ORDER — SUCCINYLCHOLINE CHLORIDE 20 MG/ML IJ SOLN
INTRAMUSCULAR | Status: DC | PRN
  Administered 2017-06-11: 100 mg via INTRAVENOUS

## 2017-06-11 MED ORDER — SUCCINYLCHOLINE CHLORIDE 200 MG/10ML IV SOSY
PREFILLED_SYRINGE | INTRAVENOUS | Status: AC
  Filled 2017-06-11: qty 10

## 2017-06-11 MED ORDER — FENTANYL CITRATE (PF) 100 MCG/2ML IJ SOLN
100.0000 ug | INTRAMUSCULAR | Status: DC | PRN
  Administered 2017-06-11: 100 ug via INTRAVENOUS
  Filled 2017-06-11 (×2): qty 2

## 2017-06-11 MED ORDER — COCAINE HCL 4 % EX SOLN
CUTANEOUS | Status: AC
  Filled 2017-06-11: qty 4

## 2017-06-11 MED ORDER — FENTANYL CITRATE (PF) 100 MCG/2ML IJ SOLN
INTRAMUSCULAR | Status: DC | PRN
  Administered 2017-06-11: 100 ug via INTRAVENOUS
  Administered 2017-06-11: 50 ug via INTRAVENOUS
  Administered 2017-06-11: 100 ug via INTRAVENOUS

## 2017-06-11 MED ORDER — FENTANYL CITRATE (PF) 100 MCG/2ML IJ SOLN: 25.0000 ug | INTRAMUSCULAR | Status: DC | PRN

## 2017-06-11 MED ORDER — PROPOFOL 10 MG/ML IV BOLUS
INTRAVENOUS | Status: AC
  Filled 2017-06-11: qty 20

## 2017-06-11 MED ORDER — ROCURONIUM BROMIDE 10 MG/ML (PF) SYRINGE
PREFILLED_SYRINGE | INTRAVENOUS | Status: AC
  Filled 2017-06-11: qty 5

## 2017-06-11 MED ORDER — FREE WATER: 200.0000 mL | Freq: Three times a day (TID) | Status: DC

## 2017-06-11 MED ORDER — DEXTROSE 5 % IV SOLN: INTRAVENOUS | Status: DC

## 2017-06-11 MED ORDER — LIDOCAINE 2% (20 MG/ML) 5 ML SYRINGE
INTRAMUSCULAR | Status: AC
  Filled 2017-06-11: qty 5

## 2017-06-11 MED ORDER — PROPOFOL 1000 MG/100ML IV EMUL
0.0000 ug/kg/min | INTRAVENOUS | Status: DC
  Administered 2017-06-11: 10 ug/kg/min via INTRAVENOUS
  Administered 2017-06-12: 50 ug/kg/min via INTRAVENOUS
  Administered 2017-06-12: 20 ug/kg/min via INTRAVENOUS
  Administered 2017-06-12 – 2017-06-13 (×3): 35 ug/kg/min via INTRAVENOUS
  Administered 2017-06-13 (×2): 30 ug/kg/min via INTRAVENOUS
  Administered 2017-06-14: 35 ug/kg/min via INTRAVENOUS
  Administered 2017-06-15 – 2017-06-16 (×4): 30 ug/kg/min via INTRAVENOUS
  Administered 2017-06-16 (×3): 40 ug/kg/min via INTRAVENOUS
  Administered 2017-06-16: 30 ug/kg/min via INTRAVENOUS
  Administered 2017-06-17 – 2017-06-18 (×3): 20 ug/kg/min via INTRAVENOUS
  Filled 2017-06-11 (×19): qty 100
  Filled 2017-06-11: qty 200
  Filled 2017-06-11: qty 100

## 2017-06-11 MED ORDER — INSULIN GLARGINE 100 UNIT/ML ~~LOC~~ SOLN
18.0000 [IU] | Freq: Two times a day (BID) | SUBCUTANEOUS | Status: DC
  Administered 2017-06-11 – 2017-06-12 (×3): 18 [IU] via SUBCUTANEOUS
  Filled 2017-06-11 (×4): qty 0.18

## 2017-06-11 MED ORDER — DEXMEDETOMIDINE HCL IN NACL 200 MCG/50ML IV SOLN
INTRAVENOUS | Status: DC | PRN
  Administered 2017-06-11: 0.7 ug/kg/h via INTRAVENOUS

## 2017-06-11 MED ORDER — CHLORHEXIDINE GLUCONATE CLOTH 2 % EX PADS
6.0000 | MEDICATED_PAD | Freq: Every day | CUTANEOUS | Status: AC
  Administered 2017-06-11: 6 via TOPICAL

## 2017-06-11 MED ORDER — FENTANYL CITRATE (PF) 250 MCG/5ML IJ SOLN
INTRAMUSCULAR | Status: AC
  Filled 2017-06-11: qty 5

## 2017-06-11 MED ORDER — COCAINE HCL 4 % EX SOLN
4.0000 mL | Freq: Once | CUTANEOUS | Status: AC
  Administered 2017-06-11: 4 mL via NASAL
  Filled 2017-06-11: qty 4

## 2017-06-11 MED ORDER — ROCURONIUM BROMIDE 10 MG/ML (PF) SYRINGE
PREFILLED_SYRINGE | INTRAVENOUS | Status: DC | PRN
  Administered 2017-06-11: 30 mg via INTRAVENOUS
  Administered 2017-06-11: 50 mg via INTRAVENOUS

## 2017-06-11 MED ORDER — DEXTROSE-NACL 5-0.9 % IV SOLN
INTRAVENOUS | Status: DC
  Administered 2017-06-11: 12:00:00 via INTRAVENOUS

## 2017-06-11 MED ORDER — MIDAZOLAM HCL 2 MG/2ML IJ SOLN
INTRAMUSCULAR | Status: AC
  Filled 2017-06-11: qty 2

## 2017-06-11 MED ORDER — LIDOCAINE-EPINEPHRINE 1 %-1:100000 IJ SOLN
INTRAMUSCULAR | Status: AC
  Filled 2017-06-11: qty 1

## 2017-06-11 MED ORDER — SODIUM CHLORIDE 0.9 % IV SOLN
250.0000 mL | INTRAVENOUS | Status: DC | PRN
  Administered 2017-06-17 (×2): via INTRAVENOUS

## 2017-06-11 MED ORDER — MIDAZOLAM HCL 2 MG/2ML IJ SOLN
INTRAMUSCULAR | Status: DC | PRN
  Administered 2017-06-11: 2 mg via INTRAVENOUS

## 2017-06-11 MED ORDER — IOPAMIDOL (ISOVUE-300) INJECTION 61%
INTRAVENOUS | Status: AC
  Administered 2017-06-11: 75 mL
  Filled 2017-06-11: qty 75

## 2017-06-11 MED ORDER — LACTATED RINGERS IV SOLN
INTRAVENOUS | Status: DC
  Administered 2017-06-11 – 2017-06-13 (×4): via INTRAVENOUS

## 2017-06-11 MED ORDER — ONDANSETRON HCL 4 MG/2ML IJ SOLN: 4.0000 mg | Freq: Once | INTRAMUSCULAR | Status: DC | PRN

## 2017-06-11 SURGICAL SUPPLY — 43 items
BLADE 10 SAFETY STRL DISP (BLADE) IMPLANT
BLADE SURG 15 STRL LF DISP TIS (BLADE) ×2 IMPLANT
BLADE SURG 15 STRL SS (BLADE) ×2
BNDG CONFORM 2 STRL LF (GAUZE/BANDAGES/DRESSINGS) IMPLANT
BNDG GAUZE ELAST 4 BULKY (GAUZE/BANDAGES/DRESSINGS) ×4 IMPLANT
CATH ROBINSON RED A/P 12FR (CATHETERS) ×4 IMPLANT
COVER SURGICAL LIGHT HANDLE (MISCELLANEOUS) ×4 IMPLANT
CRADLE DONUT ADULT HEAD (MISCELLANEOUS) IMPLANT
DRAIN PENROSE 1/2X12 LTX STRL (WOUND CARE) ×8 IMPLANT
DRAIN PENROSE 1/4X12 LTX STRL (WOUND CARE) ×4 IMPLANT
DRAPE HALF SHEET 40X57 (DRAPES) IMPLANT
DRAPE ORTHO SPLIT 77X108 STRL (DRAPES) ×2
DRAPE SURG ORHT 6 SPLT 77X108 (DRAPES) ×2 IMPLANT
DRSG PAD ABDOMINAL 8X10 ST (GAUZE/BANDAGES/DRESSINGS) IMPLANT
ELECT COATED BLADE 2.86 ST (ELECTRODE) ×4 IMPLANT
ELECT REM PT RETURN 9FT ADLT (ELECTROSURGICAL) ×4
ELECTRODE REM PT RTRN 9FT ADLT (ELECTROSURGICAL) ×2 IMPLANT
GAUZE SPONGE 4X4 12PLY STRL (GAUZE/BANDAGES/DRESSINGS) IMPLANT
GAUZE SPONGE 4X4 16PLY XRAY LF (GAUZE/BANDAGES/DRESSINGS) ×4 IMPLANT
GLOVE BIO SURGEON STRL SZ7.5 (GLOVE) ×4 IMPLANT
GOWN STRL REUS W/ TWL LRG LVL3 (GOWN DISPOSABLE) ×2 IMPLANT
GOWN STRL REUS W/TWL LRG LVL3 (GOWN DISPOSABLE) ×2
KIT BASIN OR (CUSTOM PROCEDURE TRAY) ×4 IMPLANT
KIT ROOM TURNOVER OR (KITS) ×4 IMPLANT
MARKER SKIN DUAL TIP RULER LAB (MISCELLANEOUS) ×4 IMPLANT
NEEDLE HYPO 25GX1X1/2 BEV (NEEDLE) ×4 IMPLANT
NS IRRIG 1000ML POUR BTL (IV SOLUTION) ×4 IMPLANT
PACK SURGICAL SETUP 50X90 (CUSTOM PROCEDURE TRAY) ×4 IMPLANT
PAD ARMBOARD 7.5X6 YLW CONV (MISCELLANEOUS) ×8 IMPLANT
PENCIL BUTTON HOLSTER BLD 10FT (ELECTRODE) ×4 IMPLANT
RUBBERBAND STERILE (MISCELLANEOUS) ×4 IMPLANT
SPONGE LAP 18X18 X RAY DECT (DISPOSABLE) ×8 IMPLANT
SUT ETHILON 2 0 FS 18 (SUTURE) ×16 IMPLANT
SUT ETHILON 4 0 FS 1 (SUTURE) ×4 IMPLANT
SUT SILK 2 0 SH CR/8 (SUTURE) ×4 IMPLANT
SWAB COLLECTION DEVICE MRSA (MISCELLANEOUS) ×4 IMPLANT
SWAB CULTURE ESWAB REG 1ML (MISCELLANEOUS) ×4 IMPLANT
SYR BULB IRRIGATION 50ML (SYRINGE) ×4 IMPLANT
SYR CONTROL 10ML LL (SYRINGE) ×4 IMPLANT
TUBE CONNECTING 12'X1/4 (SUCTIONS) ×1
TUBE CONNECTING 12X1/4 (SUCTIONS) ×3 IMPLANT
TUBE TRACH SHILEY 8 DIST CUF (TUBING) ×4 IMPLANT
YANKAUER SUCT BULB TIP NO VENT (SUCTIONS) ×4 IMPLANT

## 2017-06-11 NOTE — Progress Notes (Signed)
   Subjective:    Patient ID: [name] [name], male    DOB: 1955/12/24, 61 y.o.   MRN: [id number]  HPI No improvement in swelling.  No airway problems.  Tolerating tube feeds.  Review of Systems     Objective:   Physical Exam AF VSS Alert, NAD Continued marked tender edema of right eyelids, face and neck including submental region.  Unable to open right eye.  Unable to close mouth.    Assessment & Plan:  Right facial cellulitis/parotitis and parapharyngeal abscess, possible odontogenic origin.  I personally reviewed his CT scan from this morning demonstrating a well-defined fluid pocket in right parapharyngeal space and possible fluid collections in the right lower eyelid and parotid space.  I recommended proceeding with surgical drainage of the three locations.  I discussed risks, benefits, and alternatives and he and his wife expressed understanding and agreement.  Will stop tube feedings for potential surgery later today versus tomorrow.  Continue wide-spectrum antibiotics.

## 2017-06-11 NOTE — Progress Notes (Signed)
Inpatient Diabetes Program Recommendations  AACE/ADA: New Consensus Statement on Inpatient Glycemic Control (2015)  Target Ranges:  Prepandial:   less than 140 mg/dL      Peak postprandial:   less than 180 mg/dL (1-2 hours)      Critically ill patients:  140 - 180 mg/dL   Lab Results  Component Value Date   GLUCAP 206 (H) 05/31/2017   HGBA1C 10.6 (H) 06/08/2017    Review of Glycemic Control:  Results for [name], [facility] ([facility] [id number]) as of 06/09/2017 12:46  Ref. Range 06/08/2017 00:28 05/21/2017 04:13 05/18/2017 07:43 06/12/2017 12:06  Glucose-Capillary Latest Ref Range: 65 - 99 mg/dL 756233 (H) 433281 (H) 295241 (H) 206 (H)   Diabetes history: Type 2 diabetes Outpatient Diabetes medications: Metformin 500 mg bid, Glucotrol 5 mg daily Current orders for Inpatient glycemic control:  Lantus 18 units bid, Novolog resistant q 4 hours Inpatient Diabetes Program Recommendations: Note TF held for surgery today.  Once tube feeds resumed, may consider adding Novolog tube feed coverage 3 units q 4 hours.  A1C indicates that patient may need insulin upon d/c home.  Will follow.  Thanks Beryl MeagerJenny Swayze Pries, RN, BC-ADM Inpatient Diabetes Coordinator Pager [phone] (8a-5p)

## 2017-06-11 NOTE — Anesthesia Procedure Notes (Signed)
Central Venous Catheter Insertion Performed by: Kipp BroodJOSLIN, Markise Haymer, anesthesiologist Start/End06/29/2018 7:40 PM, 06/11/2017 7:45 PM Patient location: OR. Preanesthetic checklist: patient identified, IV checked, site marked and monitors and equipment checked Position: supine Hand hygiene performed , maximum sterile barriers used  and Seldinger technique used Catheter size: 8 Fr Total catheter length 17. Central line was placed.Double lumen Procedure performed using ultrasound guided technique. Ultrasound Notes:anatomy identified, needle tip was noted to be adjacent to the nerve/plexus identified, no ultrasound evidence of intravascular and/or intraneural injection and image(s) printed for medical record Attempts: 2 Following insertion, line sutured, dressing applied and Biopatch. Post procedure assessment: blood return through all ports, free fluid flow and no air  Patient tolerated the procedure well with no immediate complications. Additional procedure comments: Attempted L. Subclavian central line. Two sticks, unable to thread wire, no air aspirated.  Decision made to place central line in Femoral vein. One attempt, no difficulty noted..Marland Kitchen

## 2017-06-11 NOTE — Consult Note (Signed)
PULMONARY / CRITICAL CARE MEDICINE   Name: [name] [name] MRN: [id number] DOB: 03-03-1956    ADMISSION DATE:  05/21/2017 CONSULTATION DATE:  05/18/2017  REFERRING MD:  Dr. Sharon Seller  CHIEF COMPLAINT:  Right parapharyngeal and neck abscesses  HISTORY OF PRESENT ILLNESS:  Patient is sedated and on mechanical ventilation and therefore HPI obtained from chart review.   61 year old male with PMH as below, which is significant for DM, HTN, and Hepatitis C admitted on 6/18 with right parotitis and facial swelling.  Patient can speak some conversational English, otherwise speaks "Proofreader" language.  Patient was in his usual state of health until 6/15 when he developed right sided facial swelling with progressive fever and chills.  He was seen at urgent care 6/17 and started on clindamycin however was unable to continue due to progressive dysphagia.   CT showed right parotitis but no drainable abscess.  He was then admitted to the ICU under PCCM care for airway monitoring and continued on Clindamycin and Unasyn.  ENT was consulted with recommendations for continued antibiotic coverage.  Not a surgical candidate due to no drainable abscess. Patient was transferred to [facility] service for further monitoring on 6/20.  Repeat CT imaging on 6/22 with ill defined fluid filled collections but no discrete rim enhancing abscess.  Vancomycin was added 6/23.  Repeat CT on 6/25 showed a 3.5 cm right parapharyngeal abscess and possible fluid collection sin the right eyelid and parotid space.  Therefore, he was taken to the OR on 6/25 by ENT for I&D with placement of penrose drains to the upper and lower eyelids, right neck and submental neck.  A tracheostomy placement was performed for airway protection.  Patient to remained sedated overnight and transferred to ICU on mechanical ventilation.  PCCM to resume primary care.    PAST MEDICAL HISTORY :  He  has a past medical history of Diabetes mellitus without complication (HCC);  Hepatitis C; and Hypertension.  PAST SURGICAL HISTORY: He  has a past surgical history that includes Flexible sigmoidoscopy (N/A, 09/10/2015).  No Known Allergies  No current facility-administered medications on file prior to encounter.    Current Outpatient Prescriptions on File Prior to Encounter  Medication Sig  . clindamycin (CLEOCIN) 150 MG capsule Take 1 capsule (150 mg total) by mouth every 6 (six) hours.  Marland Kitchen glipiZIDE (GLUCOTROL) 5 MG tablet Take 1 tablet (5 mg total) by mouth daily before breakfast.  . HYDROcodone-acetaminophen (NORCO/VICODIN) 5-325 MG tablet Take 1-2 tablets by mouth every 4 (four) hours as needed.  . metFORMIN (GLUCOPHAGE) 500 MG tablet Take 1 tablet (500 mg total) by mouth 2 (two) times daily with a meal.  . azithromycin (ZITHROMAX) 250 MG tablet Take 2 tabs PO x 1 dose, then 1 tab PO QD x 4 days (Patient not taking: Reported on 05/30/2017)  . dicyclomine (BENTYL) 20 MG tablet Take 1 tablet (20 mg total) by mouth 2 (two) times daily. (Patient not taking: Reported on 03/09/2016)  . docusate sodium (COLACE) 100 MG capsule Take 1 capsule (100 mg total) by mouth 2 (two) times daily. (Patient not taking: Reported on 09/14/2015)  . HYDROcodone-homatropine (HYCODAN) 5-1.5 MG/5ML syrup Take 5 mLs by mouth every 8 (eight) hours as needed for cough. (Patient not taking: Reported on 05/20/2017)  . omeprazole (PRILOSEC) 20 MG capsule Take 1 capsule (20 mg total) by mouth 2 (two) times daily before a meal. (Patient not taking: Reported on 09/14/2015)  . oxyCODONE (OXY IR/ROXICODONE) 5 MG immediate release tablet Take  1 tablet (5 mg total) by mouth every 6 (six) hours as needed for moderate pain. (Patient not taking: Reported on 03/09/2016)  . polyethylene glycol (MIRALAX / GLYCOLAX) packet Take 17 g by mouth daily as needed for mild constipation. (Patient not taking: Reported on 03/09/2016)  . tamsulosin (FLOMAX) 0.4 MG CAPS capsule Take 1 capsule (0.4 mg total) by mouth daily. (Patient  not taking: Reported on 05/25/2017)    FAMILY HISTORY:  His indicated that his mother is deceased. He indicated that his father is deceased.    SOCIAL HISTORY: He  reports that he quit smoking about 21 years ago. He has never used smokeless tobacco. He reports that he drinks about 3.0 oz of alcohol per week . He reports that he does not use drugs.  REVIEW OF SYSTEMS:  Unable to assess as patient is sedated and on mechanical ventilation.  SUBJECTIVE:   VITAL SIGNS: BP (!) 147/82 (BP Location: Right Arm)   Pulse (!) 51   Temp 98.6 F (37 C) (Oral)   Resp 16   Ht 5' (1.524 m)   Wt 149 lb 14.6 oz (68 kg)   SpO2 99%   BMI 29.28 kg/m   HEMODYNAMICS:    VENTILATOR SETTINGS: Vent Mode: PRVC FiO2 (%):  [100 %] 100 % Set Rate:  [16 bmp] 16 bmp Vt Set:  [400 mL] 400 mL PEEP:  [5 cmH20] 5 cmH20 Plateau Pressure:  [15 cmH20] 15 cmH20  INTAKE / OUTPUT: I/O last 3 completed shifts: In: 6297 [I.V.:3824; NG/GT:1773; IV Piggyback:700] Out: 625 [Urine:550; Blood:75]  PHYSICAL EXAMINATION: General:  Adult male, lying in bed sedated on MV HEENT: Extensive right sided facial swelling with bulky dressing over right eye and submandibular neck; 8 Shiley sutured trach, left pupil 3/ reactive, cortrak via left nare to LSW, packing/gauze to right oral area, poor denition Neuro: sedated, not following commands, minimally agitated moving upper extremities  CV: s1s2 rrr, no m/r/g PULM: even/non-labored on MV via trach, lungs bilaterally clear GI: soft, bsx4 active  Extremities: warm/dry, no edema  Skin: no rashes or lesions   LABS:  BMET  Recent Labs Lab 06/09/17 0405 06/10/17 0318 06/04/2017 0148  NA 135 134* 139  K 3.9 3.4* 3.7  CL 104 106 108  CO2 23 22 24   BUN 17 17 15   CREATININE 0.86 0.73 0.75  GLUCOSE 224* 290* 253*    Electrolytes  Recent Labs Lab 06/06/17 0322  06/09/17 0405 06/10/17 0318 05/31/2017 0148  CALCIUM 7.5*  < > 7.5* 7.7* 7.9*  MG 2.0  --  1.9  --   --    < > = values in this interval not displayed.  CBC  Recent Labs Lab 06/09/17 0405 06/10/17 0318 06/07/2017 0148  WBC 10.2 17.0* 14.3*  HGB 8.1* 9.9* 9.2*  HCT 26.9* 32.0* 30.2*  PLT 108* 164 161    Coag's No results for input(s): APTT, INR in the last 168 hours.  Sepsis Markers  Recent Labs Lab 06/05/17 1126  LATICACIDVEN 2.2*    ABG No results for input(s): PHART, PCO2ART, PO2ART in the last 168 hours.  Liver Enzymes  Recent Labs Lab 06/08/17 0712 06/09/17 0405 05/30/2017 0148  AST 254* 112* 57*  ALT 81* 58 40  ALKPHOS 60 50 96  BILITOT 4.7* 5.0* 3.0*  ALBUMIN 2.1* 1.8* 1.8*    Cardiac Enzymes  Recent Labs Lab 06/08/17 0248 06/08/17 0712 06/08/17 1205  TROPONINI 0.03* 0.06* <0.03    Glucose  Recent Labs Lab 06/10/17 2036  05/19/2017 0028 06/09/2017 0413 06/04/2017 0743 05/23/2017 1206 06/03/2017 1507  GLUCAP 215* 233* 281* 241* 206* 196*   Imaging Ct Soft Tissue Neck W Contrast  Result Date: 05/18/2017 CLINICAL DATA:  Followup parotiditis. EXAM: CT NECK WITH CONTRAST TECHNIQUE: Multidetector CT imaging of the neck was performed using the standard protocol following the bolus administration of intravenous contrast. CONTRAST:  75 cc Isovue-300 COMPARISON:  06/08/2017, 05/28/2017 FINDINGS: There is further worsening by imaging. Patient appears to have diffuse suppurative inflammation of the right parotid and submandibular glands. Diffuse cellulitis/abscess formation throughout the right face and deep spaces with specific findings as below. Low-density fluid, presumably abscess, superficial to the right parotid gland with thickness measuring up to 16 mm. Low-density fluid, presumably abscess, right periorbital preseptal region measuring up to 2 cm in thickness. More superficial subcutaneous fluid in the right cheek measuring up to 17 mm in thickness. Right parapharyngeal space abscess much larger, measuring 3.5 x 2.7 cm and extending over a length of almost 6 cm,  with mass effect upon the oropharynx and hypopharynx. Abscess within the right submandibular gland extending into the sublingual region. No sign of intracranial extension in the limited intracranial images. No sign of postseptal orbital inflammation on the limited images. Diffuse mucosal inflammation of the right maxillary sinus. No layering fluid. Some dental and periodontal disease, but without advanced periodontal disease/abscess of bone. IMPRESSION: Continued worsening of inflammatory disease of the right face and deep spaces. Most notable changes include enlargement of the right parapharyngeal abscess now measuring up to 3.5 cm in diameter and extending over a length of almost 6 cm with worsened mass effect upon the hypopharynx and pharynx. See above for description of other locations of involvement/extension. Electronically Signed   By: Paulina FusiMark  Shogry M.D.   On: 05/25/2017 09:17    STUDIES:  CT Neck 6/18 > Diffuse facial swelling favored to represent bacterial RIGHT parotitis. Significant facial cellulitis, with RIGHT muscle of mastication enlargement. No visible calculi. No drainable abscess. Minor dental caries and periapical lucencies, not felt to be the source of inflammation. CXR 6/22 > Low lung volumes with right basilar atelectasis. Upper normal heart size likely exaggerated by technique and low lung volumes. Left lung base and costophrenic angle not included in the field of view. Frontal and lateral views may be helpful if patient is able. CT neck 6/22 > Extensive severe multi compartmental infectious/inflammatory process involving the right face from the right periorbital soft tissues to the upper neck. Inflammation involves the all right-sided deep cervical compartments including right parapharyngeal and retropharyngeal spaces. There multiple ill-defined fluid collections probably representing phlegmon. No discrete rim enhancing abscess is Identified. There is rightward displacement of the  airway and moderate narrowing of the airway due to fluid collections in parapharyngeal space and swelling of oral and hypopharyngeal mucosa. The origin of infection is uncertain, possibly odontogenic given the presence of odontogenic disease. CT Neck 6/25 > Continued worsening of inflammatory disease of the right face and deep spaces. Most notable changes include enlargement of the right parapharyngeal abscess now measuring up to 3.5 cm in diameter and extending over a length of almost 6 cm with worsened mass effect upon the hypopharynx and pharynx.   CULTURES: Blood 6/18 > No growth  MRSA PCR 6/18 > Negative  MRSA PCP 6/25 > Negative Staphy aureus 6/25 > Negative  Aerobic/Anareboic Culture 6/25 > Gram Stain 6/25 >   ANTIBIOTICS: Clindamycin 6/18 > 6/23 Unasyn 6/18 > 6/22 Vancomycin 6/23 > Zosyn 6/23 >  SIGNIFICANT EVENTS: 6/18 > Presents to ED 6/25 > Taken to OR for I&D and Trach   LINES/TUBES: Trach 6/25 >>  L femoral CVC 6/25 >>  DISCUSSION: 61 year old male presents to ED on 6/18 with right facial cellulitis/parotitis and parapharyngeal abscess s/p I&D and trach on 6/25   ASSESSMENT / PLAN:  PULMONARY A: Respiratory Insufficieny in setting of facial cellulitis/focal abscess s/p trach  P:   CXR and ABG now Vent Support No wean in AM so plan is to keep sedated 24-48 hours to allow for healing  Continue Solu-medrol 60 mg q 12 hours Trend CXR and ABG  Trach per ENT  CARDIOVASCULAR A:  HTN P:  ICU Monitoring  Goal MAP > 65  RENAL A:   Hypokalemia  P:   LR at 50 ml/hr  Trend BMP Replace electrolytes as needed   GASTROINTESTINAL A:   Hep C genotype 1 with splenomegaly - ? untreated P:   NPO PPI Consider TF in am  Will need ID follow-up as outpatient  HEMATOLOGIC A:   Thrombocytopenia > improving  P:  Trend CBC Hold anticoagulation > SCDs only   INFECTIOUS A:   Right Acute Parotitis with extensive facial cellulitis and focal right  parapharyngeal abscess s/p I&D P:   Trend WBC and Fever Curve  Follow Culture data  Continue Vancomycin and Zosyn  Penrose drains (right upper and lower eyelid, right neck, and submental neck) per ENT  ENDOCRINE A:   DM   P:   SSI Trend Glucose   NEUROLOGIC A:   Acute Encephalopathy secondary to sedation  P:   RASS goal: -2 Monitor  Wean propofol gtt to achieve RASS PRN fentanyl    FAMILY  - Updates: No family at bedside.    - Inter-disciplinary family meet or Palliative Care meeting due by:  06/18/17  Posey Boyer, ACNP Pulmonary and Critical Care Medicine [facility] Pager: [phone]  2017-06-29, 7:52 PM

## 2017-06-11 NOTE — Brief Op Note (Signed)
06/15/2017 - 05/26/2017  7:30 PM  PATIENT:  [name] [name]  61 y.o. male  PRE-OPERATIVE DIAGNOSIS:  RIGHT PARAPHARYNGEAL FACIAL AND NECK ABCESSES  POST-OPERATIVE DIAGNOSIS:  RIGHT PARAPHARYNGEAL FACIAL AND NECK ABCESSES  PROCEDURE:  Procedure(s): INCISION AND DRAINAGE OF RIGHT PARAPHARYNGEAL, FACIAL, EYELID, AND NECK ABCESSES (Right) TRACHEOSTOMY (N/A)  SURGEON:  Surgeon(s) and Role:    Christia Reading* Chrysten Woulfe, MD - Primary  PHYSICIAN ASSISTANT:   ASSISTANTS: none   ANESTHESIA:   general  EBL:  No intake/output data recorded.  BLOOD ADMINISTERED:none  DRAINS: Penrose drain in the upper and lower eyelids, right neck, and submental neck   LOCAL MEDICATIONS USED:  LIDOCAINE   SPECIMEN:  Source of Specimen:  Cultures from right neck  DISPOSITION OF SPECIMEN:  Microbiology  COUNTS:  YES  TOURNIQUET:  * No tourniquets in log *  DICTATION: .Other Dictation: Dictation Number [id number]  PLAN OF CARE: Return to ICU  PATIENT DISPOSITION:  ICU - intubated and hemodynamically stable.   Delay start of Pharmacological VTE agent (>24hrs) due to surgical blood loss or risk of bleeding: no

## 2017-06-11 NOTE — Progress Notes (Signed)
[place] TEAM 1 - Stepdown/ICU TEAM  Tennis Glaude  ZOX:[id number] DOB: 1956/08/01 DOA: 06/30/2017 PCP: Tonye Pearson, MD (Inactive)    Brief Narrative:  61 year old male with hx DM, HTN, and Hepatitis C who first noticed some minor R sided facial swelling on 06/01/17. This continued to worsen and he developed associated fevers/chills, dyspnea, and dysphagia. He presented to an urgent care 6/17 and was prescribed Clindamycin. He took his first dose, but was unable to take any subsequent doses due to dysphagia. He presented to [facility] ED 6/18 when a CT demonstrated right parotitis and facial cellulitis.   Significant Events: 6/18 admit to ICU for R parotitis 6/20 [facility] assumed care  6/23 Vanc added   Subjective: There has been no significant clinical change in the patient.  His facial edema remains quite impressive and he continues to be quite uncomfortable.  There is no evidence or respiratory distress at this time.  I have spoken to the patient, his wife, and a family friend at the bedside and they clearly are able to understand at least simple conversational English.  Assessment & Plan:  Right Acute Parotitis w/ extensive facial cellulitis - Poor dentition  CT scan this morning now reveals maturation into focal abscesses - ENT to take patient to the OR today - cont broad abx coverage, w/ Vanc added 6/23 - steroids resumed as concern developed over possible airway compromise  HTN BP not at goal - adjust tx today and follow   DM2 CBG not yet at goal - adjust tx again and follow   Hepatitis C genotype 1 w/ splenomegaly - diagnoses 08/02/2015 Unclear if pt has been evaluated for tx previously - will benefit from outpt f/u at [facility] for ID after his d/c - has been advised to avoid EtOH use - confirmed + test result on 06/07/17  Nutrition cortrak replaced 6/23 - tube feeding on hold for OR   DVT prophylaxis: SCDs in setting of thrombocytopenia  Code Status: FULL CODE Family  Communication: Spoke with wife and family friend at bedside  Disposition Plan: SDU for close monitoring of airway protection   Consultants:  PCCM ENT  Antimicrobials:  Clindamycin 6/18 > 6/23 Unasyn 6/18 > 6/22 Vancomycin 6/23 > Zosyn 6/23 >  Objective: Blood pressure (!) 147/79, pulse (!) 51, temperature 97.5 F (36.4 C), temperature source Oral, resp. rate 13, height 5' (1.524 m), weight 68 kg (149 lb 14.6 oz), SpO2 96 %.  Intake/Output Summary (Last 24 hours) at 06/12/2017 1048 Last data filed at 06/10/2017 1032  Gross per 24 hour  Intake             4823 ml  Output                0 ml  Net             4823 ml   Filed Weights   06/09/17 0600 06/10/17 0500 06/06/2017 0414  Weight: 67.8 kg (149 lb 7.6 oz) 67.8 kg (149 lb 7.6 oz) 68 kg (149 lb 14.6 oz)    Examination: General: No acute respiratory distress  HEENT:  Severe R facial swelling w/o appreciable change - unable to open R eye - able to speak  Lungs: no wheezing - good air movement th/o B fields w/o distress Cardiovascular: RRR w/o gallup or rub  Abdomen: Nontender, nondistended, soft, bowel sounds positive, no rebound Extremities: No edema B LE - no cyanosis   CBC:  Recent Labs Lab 06/07/17  0425 06/08/17 1610 06/09/17 0405 06/10/17 0318 05/24/2017 0148  WBC 13.1* 15.9* 10.2 17.0* 14.3*  NEUTROABS  --   --   --   --  12.7*  HGB 8.7* 9.4* 8.1* 9.9* 9.2*  HCT 28.9* 30.9* 26.9* 32.0* 30.2*  MCV 75.9* 77.6* 76.0* 74.4* 75.1*  PLT 128* 127* 108* 164 161   Basic Metabolic Panel:  Recent Labs Lab 06/06/17 0322 06/07/17 0425 06/08/17 0712 06/09/17 0405 06/10/17 0318 06/08/2017 0148  NA 135 140 138 135 134* 139  K 4.0 3.6 4.3 3.9 3.4* 3.7  CL 110 112* 109 104 106 108  CO2 17* 23 20* 23 22 24   GLUCOSE 212* 289* 148* 224* 290* 253*  BUN 18 20 17 17 17 15   CREATININE 0.90 0.92 0.93 0.86 0.73 0.75  CALCIUM 7.5* 7.7* 7.7* 7.5* 7.7* 7.9*  MG 2.0  --   --  1.9  --   --    GFR: Estimated Creatinine Clearance:  80.4 mL/min (by C-G formula based on SCr of 0.75 mg/dL).  Liver Function Tests:  Recent Labs Lab 06/06/17 0322 06/07/17 0425 06/08/17 0712 06/09/17 0405 06/15/2017 0148  AST 50* 172* 254* 112* 57*  ALT 31 50 81* 58 40  ALKPHOS 52 59 60 50 96  BILITOT 4.8* 5.8* 4.7* 5.0* 3.0*  PROT 7.0 6.9 7.0 6.3* 6.3*  ALBUMIN 2.0* 2.0* 2.1* 1.8* 1.8*    HbA1C: Hgb A1c MFr Bld  Date/Time Value Ref Range Status  06/08/2017 03:00 PM 10.6 (H) 4.8 - 5.6 % Final    Comment:    (NOTE)         Pre-diabetes: 5.7 - 6.4         Diabetes: >6.4         Glycemic control for adults with diabetes: <7.0   09/11/2015 05:34 AM 7.4 (H) 4.8 - 5.6 % Final    Comment:    (NOTE)         Pre-diabetes: 5.7 - 6.4         Diabetes: >6.4         Glycemic control for adults with diabetes: <7.0     CBG:  Recent Labs Lab 06/10/17 1602 06/10/17 2036 06/12/2017 0028 06/06/2017 0413 06/09/2017 0743  GLUCAP 291* 215* 233* 281* 241*    Recent Results (from the past 240 hour(s))  Blood Culture (routine x 2)     Status: None   Collection Time: 06/03/2017  9:38 AM  Result Value Ref Range Status   Specimen Description BLOOD RIGHT HAND  Final   Special Requests   Final    BOTTLES DRAWN AEROBIC AND ANAEROBIC Blood Culture adequate volume   Culture NO GROWTH 5 DAYS  Final   Report Status 06/09/2017 FINAL  Final  Blood Culture (routine x 2)     Status: None   Collection Time: 05/28/2017  9:39 AM  Result Value Ref Range Status   Specimen Description BLOOD LEFT ANTECUBITAL  Final   Special Requests   Final    BOTTLES DRAWN AEROBIC AND ANAEROBIC Blood Culture adequate volume   Culture NO GROWTH 5 DAYS  Final   Report Status 06/09/2017 FINAL  Final  MRSA PCR Screening     Status: None   Collection Time: 05/18/2017  6:01 PM  Result Value Ref Range Status   MRSA by PCR NEGATIVE NEGATIVE Final    Comment:        The GeneXpert MRSA Assay (FDA approved for NASAL specimens only), is one component of  a comprehensive MRSA  colonization surveillance program. It is not intended to diagnose MRSA infection nor to guide or monitor treatment for MRSA infections.      Scheduled Meds: . insulin aspart  0-20 Units Subcutaneous Q4H  . insulin glargine  14 Units Subcutaneous BID  . mouth rinse  15 mL Mouth Rinse BID  . methylPREDNISolone (SOLU-MEDROL) injection  60 mg Intravenous Q12H  . mupirocin ointment   Topical BID  . pantoprazole sodium  40 mg Per Tube Daily     LOS: 7 days   Lonia BloodJeffrey T. Yachet Mattson, MD [facility] Office  [phone] Pager - Text Page per Amion as per below:  On-Call/Text Page:      [email or web address]      password [email or web address]  If 7PM-7AM, please contact night-coverage [email or web address] Password [email or web address] 05/31/2017, 10:48 AM

## 2017-06-11 NOTE — Consult Note (Signed)
Anesthesiology Note:  [age] year  male with with severe edema of R. Face/ neck and orbit C/W cellulitis and abscess. Decision made to proceed with awake fiberoptic intubation. Pt sedated with precedex 0.7 mcg/kg. L. Nares topically anesthetized with 4% cocaine soaked iodoform gauze. L. Glossopharyngeal and superior laryngeal nerve block performed  topically with 4% lidocaine soaked cotton balls. 7.0 nasal ETT inserted via L. Nares over fiberoptic bronchoscope. BBSE, (+) end-tidal CO2 noted. Tube position visually confirmed with bronchoscope.  [name] [name]

## 2017-06-11 NOTE — Anesthesia Preprocedure Evaluation (Signed)
Anesthesia Evaluation  Patient identified by MRN, date of birth, ID band Patient awake    Reviewed: Allergy & Precautions, NPO status , Patient's Chart, lab work & pertinent test results  Airway    Neck ROM: Limited  Mouth opening: Limited Mouth Opening Comment: Severe firm edema and erythema of R. Neck and face. Dental  (+) Teeth Intact   Pulmonary former smoker,    breath sounds clear to auscultation       Cardiovascular hypertension,  Rhythm:Regular Rate:Tachycardia     Neuro/Psych    GI/Hepatic   Endo/Other  diabetes  Renal/GU      Musculoskeletal   Abdominal   Peds  Hematology   Anesthesia Other Findings   Reproductive/Obstetrics                            Anesthesia Physical Anesthesia Plan  ASA: IV  Anesthesia Plan: General   Post-op Pain Management:    Induction:   PONV Risk Score and Plan: Ondansetron  Airway Management Planned: Awake Intubation Planned, Fiberoptic Intubation Planned and Nasal ETT  Additional Equipment:   Intra-op Plan:   Post-operative Plan: Post-operative intubation/ventilation  Informed Consent: I have reviewed the patients History and Physical, chart, labs and discussed the procedure including the risks, benefits and alternatives for the proposed anesthesia with the patient or authorized representative who has indicated his/her understanding and acceptance.     Plan Discussed with: CRNA and Anesthesiologist  Anesthesia Plan Comments:        Anesthesia Quick Evaluation

## 2017-06-11 NOTE — Anesthesia Postprocedure Evaluation (Signed)
Anesthesia Post Note  Patient: [name] [name]  Procedure(s) Performed: Procedure(s) (LRB): INCISION AND DRAINAGE OF RIGHT PARAPHARYNGEAL, FACIAL, EYELID, AND NECK ABCESSES (Right) TRACHEOSTOMY (N/A)     Patient location during evaluation: PACU Anesthesia Type: General Level of consciousness: awake and alert Pain management: pain level controlled Vital Signs Assessment: post-procedure vital signs reviewed and stable Respiratory status: spontaneous breathing, nonlabored ventilation, respiratory function stable and patient connected to nasal cannula oxygen Cardiovascular status: blood pressure returned to baseline and stable Postop Assessment: no signs of nausea or vomiting Anesthetic complications: no    Last Vitals:  Vitals:   05/20/2017 2200 05/29/2017 2230  BP: 125/70 119/74  Pulse: 65 70  Resp: 15 13  Temp:      Last Pain:  Vitals:   06/09/2017 2100  TempSrc: Axillary  PainSc:                  Miki Blank DAVID

## 2017-06-11 NOTE — Transfer of Care (Signed)
Immediate Anesthesia Transfer of Care Note  Patient: [name] [name]  Procedure(s) Performed: Procedure(s): INCISION AND DRAINAGE OF RIGHT PARAPHARYNGEAL, FACIAL, EYELID, AND NECK ABCESSES (Right) TRACHEOSTOMY (N/A)  Patient Location: SICU  Anesthesia Type:General  Level of Consciousness: sedated  Airway & Oxygen Therapy: Patient remains intubated per anesthesia plan and Patient placed on Ventilator (see vital sign flow sheet for setting)  Post-op Assessment: Report given to RN and Post -op Vital signs reviewed and stable  Post vital signs: Reviewed and stable  Last Vitals:  Vitals:   06/08/2017 0800 05/23/2017 1203  BP: (!) 147/79 (!) 147/82  Pulse: (!) 51 (!) 51  Resp: 13 16  Temp:  37 C    Last Pain:  Vitals:   06/13/2017 1300  TempSrc:   PainSc: Asleep      Patients Stated Pain Goal: 3 (06/08/17 0758)  Complications: No apparent anesthesia complications

## 2017-06-12 ENCOUNTER — Encounter (HOSPITAL_COMMUNITY): Payer: Self-pay | Admitting: Otolaryngology

## 2017-06-12 DIAGNOSIS — J9600 Acute respiratory failure, unspecified whether with hypoxia or hypercapnia: Secondary | ICD-10-CM

## 2017-06-12 DIAGNOSIS — J9690 Respiratory failure, unspecified, unspecified whether with hypoxia or hypercapnia: Secondary | ICD-10-CM

## 2017-06-12 LAB — BLOOD GAS, ARTERIAL
Acid-Base Excess: 3.2 mmol/L — ABNORMAL HIGH (ref 0.0–2.0)
BICARBONATE: 26.6 mmol/L (ref 20.0–28.0)
Drawn by: 418751
FIO2: 40
LHR: 16 {breaths}/min
MECHVT: 400 mL
O2 Saturation: 98.8 %
PEEP: 5 cmH2O
PO2 ART: 111 mmHg — AB (ref 83.0–108.0)
Patient temperature: 98.6
pCO2 arterial: 35.9 mmHg (ref 32.0–48.0)
pH, Arterial: 7.482 — ABNORMAL HIGH (ref 7.350–7.450)

## 2017-06-12 LAB — GLUCOSE, CAPILLARY
GLUCOSE-CAPILLARY: 110 mg/dL — AB (ref 65–99)
GLUCOSE-CAPILLARY: 160 mg/dL — AB (ref 65–99)
Glucose-Capillary: 121 mg/dL — ABNORMAL HIGH (ref 65–99)
Glucose-Capillary: 175 mg/dL — ABNORMAL HIGH (ref 65–99)
Glucose-Capillary: 236 mg/dL — ABNORMAL HIGH (ref 65–99)
Glucose-Capillary: 256 mg/dL — ABNORMAL HIGH (ref 65–99)

## 2017-06-12 LAB — CBC
HCT: 26.6 % — ABNORMAL LOW (ref 39.0–52.0)
Hemoglobin: 7.9 g/dL — ABNORMAL LOW (ref 13.0–17.0)
MCH: 23 pg — ABNORMAL LOW (ref 26.0–34.0)
MCHC: 29.7 g/dL — ABNORMAL LOW (ref 30.0–36.0)
MCV: 77.6 fL — AB (ref 78.0–100.0)
PLATELETS: 125 10*3/uL — AB (ref 150–400)
RBC: 3.43 MIL/uL — AB (ref 4.22–5.81)
RDW: 15.2 % (ref 11.5–15.5)
WBC: 15.8 10*3/uL — AB (ref 4.0–10.5)

## 2017-06-12 LAB — BASIC METABOLIC PANEL
Anion gap: 7 (ref 5–15)
BUN: 12 mg/dL (ref 6–20)
CHLORIDE: 104 mmol/L (ref 101–111)
CO2: 26 mmol/L (ref 22–32)
Calcium: 7.6 mg/dL — ABNORMAL LOW (ref 8.9–10.3)
Creatinine, Ser: 0.95 mg/dL (ref 0.61–1.24)
GFR calc Af Amer: >60 mL/min (ref >60)
GFR calc non Af Amer: >60 mL/min (ref >60)
GLUCOSE: 128 mg/dL — AB (ref 65–99)
POTASSIUM: 3.8 mmol/L (ref 3.5–5.1)
Sodium: 137 mmol/L (ref 135–145)

## 2017-06-12 LAB — MRSA PCR SCREENING: MRSA BY PCR: NEGATIVE

## 2017-06-12 LAB — VANCOMYCIN, TROUGH: VANCOMYCIN TR: 10 ug/mL — AB (ref 15–20)

## 2017-06-12 LAB — TRIGLYCERIDES: Triglycerides: 230 mg/dL — ABNORMAL HIGH (ref <150)

## 2017-06-12 LAB — MAGNESIUM: Magnesium: 1.8 mg/dL (ref 1.7–2.4)

## 2017-06-12 LAB — PHOSPHORUS: Phosphorus: 2.4 mg/dL — ABNORMAL LOW (ref 2.5–4.6)

## 2017-06-12 MED ORDER — PRO-STAT SUGAR FREE PO LIQD
30.0000 mL | Freq: Two times a day (BID) | ORAL | Status: DC
  Administered 2017-06-12: 30 mL
  Filled 2017-06-12: qty 30

## 2017-06-12 MED ORDER — VANCOMYCIN HCL IN DEXTROSE 1-5 GM/200ML-% IV SOLN
1000.0000 mg | Freq: Three times a day (TID) | INTRAVENOUS | Status: DC
  Administered 2017-06-12 – 2017-06-14 (×5): 1000 mg via INTRAVENOUS
  Filled 2017-06-12 (×6): qty 200

## 2017-06-12 MED ORDER — FENTANYL BOLUS VIA INFUSION
50.0000 ug | INTRAVENOUS | Status: DC | PRN
  Administered 2017-06-12 – 2017-06-23 (×12): 50 ug via INTRAVENOUS
  Filled 2017-06-12: qty 50

## 2017-06-12 MED ORDER — GLUCERNA 1.2 CAL PO LIQD
1000.0000 mL | ORAL | Status: DC
  Administered 2017-06-12 – 2017-06-25 (×11): 1000 mL
  Filled 2017-06-12 (×17): qty 1000

## 2017-06-12 MED ORDER — FENTANYL CITRATE (PF) 100 MCG/2ML IJ SOLN: 50.0000 ug | Freq: Once | INTRAMUSCULAR | Status: DC

## 2017-06-12 MED ORDER — CLINDAMYCIN PHOSPHATE 900 MG/50ML IV SOLN
900.0000 mg | Freq: Three times a day (TID) | INTRAVENOUS | Status: DC
  Administered 2017-06-12 – 2017-06-14 (×6): 900 mg via INTRAVENOUS
  Filled 2017-06-12 (×7): qty 50

## 2017-06-12 MED ORDER — FENTANYL 2500MCG IN NS 250ML (10MCG/ML) PREMIX INFUSION
25.0000 ug/h | INTRAVENOUS | Status: DC
  Administered 2017-06-12: 50 ug/h via INTRAVENOUS
  Administered 2017-06-13: 125 ug/h via INTRAVENOUS
  Administered 2017-06-14: 150 ug/h via INTRAVENOUS
  Administered 2017-06-15 – 2017-06-17 (×2): 100 ug/h via INTRAVENOUS
  Administered 2017-06-18: 50 ug/h via INTRAVENOUS
  Administered 2017-06-18: 100 ug/h via INTRAVENOUS
  Administered 2017-06-19: 25 ug/h via INTRAVENOUS
  Administered 2017-06-19: 100 ug/h via INTRAVENOUS
  Administered 2017-06-20: 250 ug/h via INTRAVENOUS
  Administered 2017-06-20: 200 ug/h via INTRAVENOUS
  Administered 2017-06-21 – 2017-06-24 (×4): 100 ug/h via INTRAVENOUS
  Filled 2017-06-12 (×15): qty 250

## 2017-06-12 MED ORDER — PRO-STAT SUGAR FREE PO LIQD
30.0000 mL | Freq: Three times a day (TID) | ORAL | Status: DC
  Administered 2017-06-12 – 2017-06-25 (×33): 30 mL
  Filled 2017-06-12 (×36): qty 30

## 2017-06-12 MED ORDER — HEPARIN SODIUM (PORCINE) 5000 UNIT/ML IJ SOLN
5000.0000 [IU] | Freq: Three times a day (TID) | INTRAMUSCULAR | Status: DC
  Administered 2017-06-12 – 2017-06-15 (×9): 5000 [IU] via SUBCUTANEOUS
  Filled 2017-06-12 (×10): qty 1

## 2017-06-12 MED ORDER — CHLORHEXIDINE GLUCONATE 0.12% ORAL RINSE (MEDLINE KIT)
15.0000 mL | Freq: Two times a day (BID) | OROMUCOSAL | Status: DC
  Administered 2017-06-12 – 2017-06-25 (×27): 15 mL via OROMUCOSAL

## 2017-06-12 MED ORDER — VITAL HIGH PROTEIN PO LIQD: 1000.0000 mL | ORAL | Status: DC

## 2017-06-12 MED ORDER — ORAL CARE MOUTH RINSE
15.0000 mL | OROMUCOSAL | Status: DC
  Administered 2017-06-12 – 2017-06-25 (×122): 15 mL via OROMUCOSAL

## 2017-06-12 NOTE — Progress Notes (Signed)
Pharmacy Antibiotic Note  [name] [name] is a 61 y.o. male continues on day #4 vancomycin and zosyn for bacterial parotitis, facial cellulitis, and neck abscess. Vancomycin trough drawn today is below goal at 10 (goal 15-20). Zosyn dose appropriate. Renal function stable.  Plan: 1) Increase vancomycin to 1g IV q8 for predicted trough of 17 2) Continue zosyn 3.375g IV q8 (4 hour infusion) 3) Continue to follow renal function, cultures, LOT, trough at new steady state  Height: 5' (152.4 cm) Weight: 153 lb 7 oz (69.6 kg) IBW/kg (Calculated) : 50  Temp (24hrs), Avg:100.4 F (38 C), Min:97.6 F (36.4 C), Max:103.3 F (39.6 C)   Recent Labs Lab 06/08/17 0712 06/09/17 0405 06/10/17 0318 05/19/2017 0148 06/12/17 0458 06/12/17 0604 06/12/17 0940  WBC 15.9* 10.2 17.0* 14.3*  --  15.8*  --   CREATININE 0.93 0.86 0.73 0.75 0.95  --   --   VANCOTROUGH  --   --   --   --   --   --  10*    Estimated Creatinine Clearance: 68.4 mL/min (by C-G formula based on SCr of 0.95 mg/dL).    No Known Allergies  Antimicrobials this admission: 6/18 Unasyn >6/23 6/18 Clinda >6/23 6/23 Vancomycin >> 6/23 Zosyn>>  Dose adjustments this admission: 6/26 VT = 10 on 750mg  q12 > increase to 1g q8  Microbiology results: 6/18 blood cx: neg 6/18 HIV neg 6/18 MRSA neg 6/25 neck abscess > moderate GNR  Thank you for allowing pharmacy to be a part of this patient's care.  Fredrik RiggerMarkle, Kiefer Opheim Sue 06/12/2017 12:46 PM

## 2017-06-12 NOTE — Progress Notes (Signed)
   Subjective:    Patient ID: [name] [name], male    DOB: [birth year], 61 y.o.   MRN: [id number]  HPI Uneventful night.  Resting on ventilator, sedated.  Review of Systems     Objective:   Physical Exam Tm 103.3 VSS Sedated Changed intraoral packing.  Penrose drains in place in upper and lower eyelids and submental and lateral right neck.  Significant drainage from sites without much frank pus.  Cheek remains firmly edematous.  Right lower eyelid skin broken down. Trach site stable with cuffed #8 Shiley in place.     Assessment & Plan:  Right facial, eyelid, neck, and parapharyngeal abscesses s/p I&D and tracheostomy  Hemodynamically stable.  Drains in place.  Dressing changes.  Broad spectrum antibiotics.  Cultures pending.  Recommend formal ID consult.

## 2017-06-12 NOTE — Consult Note (Signed)
PULMONARY / CRITICAL CARE MEDICINE   Name: [name] [name] MRN: [id number] DOB: 1956/06/01    ADMISSION DATE:  05/24/2017 CONSULTATION DATE:  2017/06/28  REFERRING MD:  Dr. Sharon Seller  CHIEF COMPLAINT:  Right parapharyngeal and neck abscesses  HISTORY OF PRESENT ILLNESS:  Patient is sedated and on mechanical ventilation and therefore HPI obtained from chart review.   61 year old male with PMH as below, which is significant for DM, HTN, and Hepatitis C admitted on 6/18 with right parotitis and facial swelling.  Patient can speak some conversational English, otherwise speaks "Proofreader" language.  Patient was in his usual state of health until 6/15 when he developed right sided facial swelling with progressive fever and chills.  He was seen at urgent care 6/17 and started on clindamycin however was unable to continue due to progressive dysphagia.   CT showed right parotitis but no drainable abscess.  He was then admitted to the ICU under PCCM care for airway monitoring and continued on Clindamycin and Unasyn.  ENT was consulted with recommendations for continued antibiotic coverage.  Not a surgical candidate due to no drainable abscess. Patient was transferred to [facility] service for further monitoring on 6/20.  Repeat CT imaging on 6/22 with ill defined fluid filled collections but no discrete rim enhancing abscess.  Vancomycin was added 6/23.  Repeat CT on 6/25 showed a 3.5 cm right parapharyngeal abscess and possible fluid collection sin the right eyelid and parotid space.  Therefore, he was taken to the OR on 6/25 by ENT for I&D with placement of penrose drains to the upper and lower eyelids, right neck and submental neck.  A tracheostomy placement was performed for airway protection.  Patient to remained sedated overnight and transferred to ICU on mechanical ventilation.  PCCM to resume primary care.    PAST MEDICAL HISTORY :  He  has a past medical history of Diabetes mellitus without complication (HCC);  Hepatitis C; and Hypertension.  PAST SURGICAL HISTORY: He  has a past surgical history that includes Flexible sigmoidoscopy (N/A, 09/10/2015).  No Known Allergies  No current facility-administered medications on file prior to encounter.    Current Outpatient Prescriptions on File Prior to Encounter  Medication Sig  . clindamycin (CLEOCIN) 150 MG capsule Take 1 capsule (150 mg total) by mouth every 6 (six) hours.  Marland Kitchen glipiZIDE (GLUCOTROL) 5 MG tablet Take 1 tablet (5 mg total) by mouth daily before breakfast.  . HYDROcodone-acetaminophen (NORCO/VICODIN) 5-325 MG tablet Take 1-2 tablets by mouth every 4 (four) hours as needed.  . metFORMIN (GLUCOPHAGE) 500 MG tablet Take 1 tablet (500 mg total) by mouth 2 (two) times daily with a meal.  . azithromycin (ZITHROMAX) 250 MG tablet Take 2 tabs PO x 1 dose, then 1 tab PO QD x 4 days (Patient not taking: Reported on 05/20/2017)  . dicyclomine (BENTYL) 20 MG tablet Take 1 tablet (20 mg total) by mouth 2 (two) times daily. (Patient not taking: Reported on 03/09/2016)  . docusate sodium (COLACE) 100 MG capsule Take 1 capsule (100 mg total) by mouth 2 (two) times daily. (Patient not taking: Reported on 09/14/2015)  . HYDROcodone-homatropine (HYCODAN) 5-1.5 MG/5ML syrup Take 5 mLs by mouth every 8 (eight) hours as needed for cough. (Patient not taking: Reported on 05/29/2017)  . omeprazole (PRILOSEC) 20 MG capsule Take 1 capsule (20 mg total) by mouth 2 (two) times daily before a meal. (Patient not taking: Reported on 09/14/2015)  . oxyCODONE (OXY IR/ROXICODONE) 5 MG immediate release tablet Take  1 tablet (5 mg total) by mouth every 6 (six) hours as needed for moderate pain. (Patient not taking: Reported on 03/09/2016)  . polyethylene glycol (MIRALAX / GLYCOLAX) packet Take 17 g by mouth daily as needed for mild constipation. (Patient not taking: Reported on 03/09/2016)  . tamsulosin (FLOMAX) 0.4 MG CAPS capsule Take 1 capsule (0.4 mg total) by mouth daily. (Patient  not taking: Reported on 05/30/2017)    FAMILY HISTORY:  His indicated that his mother is deceased. He indicated that his father is deceased.    SOCIAL HISTORY: He  reports that he quit smoking about 21 years ago. He has never used smokeless tobacco. He reports that he drinks about 3.0 oz of alcohol per week . He reports that he does not use drugs.  REVIEW OF SYSTEMS:  Unable to assess as patient is sedated and on mechanical ventilation.  SUBJECTIVE:   VITAL SIGNS: BP (!) 104/56   Pulse 71   Temp (!) 100.9 F (38.3 C) (Axillary)   Resp 16   Ht 5' (1.524 m)   Wt 153 lb 7 oz (69.6 kg)   SpO2 99%   BMI 29.97 kg/m   HEMODYNAMICS:    VENTILATOR SETTINGS: Vent Mode: PRVC FiO2 (%):  [40 %-100 %] 40 % Set Rate:  [16 bmp] 16 bmp Vt Set:  [400 mL] 400 mL PEEP:  [5 cmH20] 5 cmH20 Plateau Pressure:  [7 cmH20-15 cmH20] 8 cmH20  INTAKE / OUTPUT: I/O last 3 completed shifts: In: 7229.3 [I.V.:4706.3; NG/GT:1773; IV Piggyback:750] Out: 1625 [Urine:1550; Blood:75]  PHYSICAL EXAMINATION: Gen:      No acute distress HEENT:  EOMI, sclera anicteric, trach in place Neck:     No masses; no thyromegaly Lungs:    Clear to auscultation bilaterally; normal respiratory effort CV:         Regular rate and rhythm; no murmurs Abd:      + bowel sounds; soft, non-tender; no palpable masses, no distension Ext:    No edema; adequate peripheral perfusion Skin:      Warm and dry; no rash Neuro: Sedated, unresponsive   LABS:  BMET  Recent Labs Lab 06/10/17 0318 Jun 29, 2017 0148 06/12/17 0458  NA 134* 139 137  K 3.4* 3.7 3.8  CL 106 108 104  CO2 22 24 26   BUN 17 15 12   CREATININE 0.73 0.75 0.95  GLUCOSE 290* 253* 128*    Electrolytes  Recent Labs Lab 06/06/17 0322  06/09/17 0405 06/10/17 0318 06/29/17 0148 06/12/17 0458  CALCIUM 7.5*  < > 7.5* 7.7* 7.9* 7.6*  MG 2.0  --  1.9  --   --  1.8  PHOS  --   --   --   --   --  2.4*  < > = values in this interval not  displayed.  CBC  Recent Labs Lab 06/10/17 0318 06-29-17 0148 06/12/17 0604  WBC 17.0* 14.3* 15.8*  HGB 9.9* 9.2* 7.9*  HCT 32.0* 30.2* 26.6*  PLT 164 161 125*    Coag's No results for input(s): APTT, INR in the last 168 hours.  Sepsis Markers  Recent Labs Lab 06/05/17 1126  LATICACIDVEN 2.2*    ABG  Recent Labs Lab 06/12/17 0500  PHART 7.482*  PCO2ART 35.9  PO2ART 111*    Liver Enzymes  Recent Labs Lab 06/08/17 0712 06/09/17 0405 June 29, 2017 0148  AST 254* 112* 57*  ALT 81* 58 40  ALKPHOS 60 50 96  BILITOT 4.7* 5.0* 3.0*  ALBUMIN 2.1* 1.8* 1.8*  Cardiac Enzymes  Recent Labs Lab 06/08/17 0248 06/08/17 0712 06/08/17 1205  TROPONINI 0.03* 0.06* <0.03    Glucose  Recent Labs Lab 06/12/2017 1206 05/31/2017 1507 06/05/2017 2137 06/04/2017 2321 06/12/17 0429 06/12/17 0734  GLUCAP 206* 196* 203* 166* 121* 110*   Imaging Ct Soft Tissue Neck W Contrast  Result Date: 05/21/2017 CLINICAL DATA:  Followup parotiditis. EXAM: CT NECK WITH CONTRAST TECHNIQUE: Multidetector CT imaging of the neck was performed using the standard protocol following the bolus administration of intravenous contrast. CONTRAST:  75 cc Isovue-300 COMPARISON:  06/08/2017, 06/07/2017 FINDINGS: There is further worsening by imaging. Patient appears to have diffuse suppurative inflammation of the right parotid and submandibular glands. Diffuse cellulitis/abscess formation throughout the right face and deep spaces with specific findings as below. Low-density fluid, presumably abscess, superficial to the right parotid gland with thickness measuring up to 16 mm. Low-density fluid, presumably abscess, right periorbital preseptal region measuring up to 2 cm in thickness. More superficial subcutaneous fluid in the right cheek measuring up to 17 mm in thickness. Right parapharyngeal space abscess much larger, measuring 3.5 x 2.7 cm and extending over a length of almost 6 cm, with mass effect upon the  oropharynx and hypopharynx. Abscess within the right submandibular gland extending into the sublingual region. No sign of intracranial extension in the limited intracranial images. No sign of postseptal orbital inflammation on the limited images. Diffuse mucosal inflammation of the right maxillary sinus. No layering fluid. Some dental and periodontal disease, but without advanced periodontal disease/abscess of bone. IMPRESSION: Continued worsening of inflammatory disease of the right face and deep spaces. Most notable changes include enlargement of the right parapharyngeal abscess now measuring up to 3.5 cm in diameter and extending over a length of almost 6 cm with worsened mass effect upon the hypopharynx and pharynx. See above for description of other locations of involvement/extension. Electronically Signed   By: Paulina FusiMark  Shogry M.D.   On: 05/29/2017 09:17    STUDIES:  CT Neck 6/18 > Diffuse facial swelling favored to represent bacterial RIGHT parotitis. Significant facial cellulitis, with RIGHT muscle of mastication enlargement. No visible calculi. No drainable abscess. Minor dental caries and periapical lucencies, not felt to be the source of inflammation. CXR 6/22 > Low lung volumes with right basilar atelectasis. Upper normal heart size likely exaggerated by technique and low lung volumes. Left lung base and costophrenic angle not included in the field of view. Frontal and lateral views may be helpful if patient is able. CT neck 6/22 > Extensive severe multi compartmental infectious/inflammatory process involving the right face from the right periorbital soft tissues to the upper neck. Inflammation involves the all right-sided deep cervical compartments including right parapharyngeal and retropharyngeal spaces. There multiple ill-defined fluid collections probably representing phlegmon. No discrete rim enhancing abscess is Identified. There is rightward displacement of the airway and moderate  narrowing of the airway due to fluid collections in parapharyngeal space and swelling of oral and hypopharyngeal mucosa. The origin of infection is uncertain, possibly odontogenic given the presence of odontogenic disease. CT Neck 6/25 > Continued worsening of inflammatory disease of the right face and deep spaces. Most notable changes include enlargement of the right parapharyngeal abscess now measuring up to 3.5 cm in diameter and extending over a length of almost 6 cm with worsened mass effect upon the hypopharynx and pharynx.   CULTURES: Blood 6/18 > No growth  MRSA PCR 6/18 > Negative  MRSA PCP 6/25 > Negative Staphy aureus 6/25 > Negative  Aerobic/Anareboic Culture 6/25 > Gram Stain 6/25 > GNR  ANTIBIOTICS: Clindamycin 6/18 > 6/23 Unasyn 6/18 > 6/22 Vancomycin 6/23 > Zosyn 6/23 >  SIGNIFICANT EVENTS: 6/18 > Presents to ED 6/25 > Taken to OR for I&D and Trach   LINES/TUBES: Trach 6/25 >>  L femoral CVC 6/25 >>  DISCUSSION: 61 year old male presents to ED on 6/18 with right facial cellulitis/parotitis and parapharyngeal abscess s/p I&D and trach on 6/25   ASSESSMENT / PLAN:  PULMONARY A: Respiratory Insufficieny in setting of facial cellulitis/focal abscess s/p trach  P:   Start PSV weans Lighten sedation Continue solumedrol at current dose  CARDIOVASCULAR A:  HTN P:  Tele monitoring  RENAL A:   Hypokalemia  P:   Replete lytes Monitor urine output and cr  GASTROINTESTINAL A:   Hep C genotype 1 with splenomegaly - ? untreated P:   PPI Start tube feeds  HEMATOLOGIC A:   Thrombocytopenia > improving  P:  Follow CBC Start SQ heparin  INFECTIOUS A:   Right Acute Parotitis with extensive facial cellulitis and focal right parapharyngeal abscess s/p I&D P:   Trend WBC and Fever Curve  Follow Culture data  Continue Vancomycin and Zosyn  Penrose drains (right upper and lower eyelid, right neck, and submental neck) per ENT ID  consult  ENDOCRINE A:   DM   P:   SSI coverage  NEUROLOGIC A:   Acute Encephalopathy secondary to sedation  P:   RASS goal: 0 Monitor  Wean propofol PRN fentanyl     FAMILY  - Updates: No family at bedside.    - Inter-disciplinary family meet or Palliative Care meeting due by:  06/18/17  The patient is critically ill with multiple organ system failure and requires high complexity decision making for assessment and support, frequent evaluation and titration of therapies, advanced monitoring, review of radiographic studies and interpretation of complex data.   Critical Care Time devoted to patient care services, exclusive of separately billable procedures, described in this note is 35 minutes.   Chilton Greathouse MD [facility] Pager [phone] If no answer or after 3pm call: [phone] 06/12/2017, 9:21 AM

## 2017-06-12 NOTE — Progress Notes (Addendum)
Initial Nutrition Assessment  DOCUMENTATION CODES:   Not applicable  INTERVENTION:    Initiate TF via Cortrak tube with Glucerna 1.2 at goal rate of 45 ml/h (1080 ml per day) and Prostat 30 ml TID   TF regimen + Propofol infusion to provide 1812 kcals, 110 gm protein, 869 ml free water daily  NUTRITION DIAGNOSIS:   Inadequate oral intake related to inability to eat as evidenced by NPO status, ongoing  GOAL:   Patient will meet greater than or equal to 90% of their needs, progressing   MONITOR:   Vent status, TF tolerance, Labs, Weight trends, I & O's  ASSESSMENT:   61 yo male with PMH of DM, HTN, Hepatitis C who was admitted on 6/18 with facial swelling, dyspnea, and dysphagia related to R parotitis and facial cellulitis.   Patient is currently intubated on ventilator support >> trach MV: 8.4 L/min Temp (24hrs), Avg:100.4 F (38 C), Min:97.6 F (36.4 C), Max:103.3 F (39.6 C)  Propofol: 8.2 ml/hr >> 216 fat kcals   Pt s/p I & D of R parapharyngeal, facial, eyelid and neck abscesses 6/25. Cortrak tube replaced during surgery. Tip overlying the gastric body. Pt previously receiving Glucerna 1.2 formula at 60 ml/hr. Labs and medications reviewed. P 2.4 (L). CBG's 121-110-160.  Diet Order:  Diet NPO time specified  Skin:  Reviewed, no issues  Last BM:  6/24  Height:   Ht Readings from Last 1 Encounters:  06/09/17 5' (1.524 m)   Weight:   Wt Readings from Last 1 Encounters:  06/12/17 153 lb 7 oz (69.6 kg)   Ideal Body Weight:  48.2 kg  BMI:  Body mass index is 29.97 kg/m.  Estimated Nutritional Needs:   Kcal:  1811  Protein:  105-115 gm  Fluid:  1.8 L  EDUCATION NEEDS:   No education needs identified at this time  Maureen ChattersKatie Makenzee Choudhry, RD, LDN Pager #: [phone] After-Hours Pager #: [phone]

## 2017-06-12 NOTE — Care Management Note (Signed)
Case Management Note Previous CM note initiated by   Patient Details  Name: [name] [name] MRN: [id number] Date of Birth: 04/29/1956  Subjective/Objective:   From home with wife, pta indep,  Presents with  right acute parotitis w/ extensive facial cellulitis. He has [employer] insurance. [facility] [address] but they have moved to another area and he has being seeing a Dr. Neva SeatGreene.  PCP   Dr. Neva SeatGreene             Action/Plan: NCM will follow for dc needs.   Expected Discharge Date:                  Expected Discharge Plan:  Home/Self Care  In-House Referral:     Discharge planning Services  CM Consult  Post Acute Care Choice:    Choice offered to:     DME Arranged:    DME Agency:     HH Arranged:    HH Agency:     Status of Service:  In process, will continue to follow  If discussed at Long Length of Stay Meetings, dates discussed:    Discharge Disposition:   Additional Comments:  06/12/17- 1045-  Pt currently on vent- s/p I&D by ENT on 6/25 for Right parapharyngeal and neck abscesses-  Pt can speak some conversational english but primary language is [place]- per CM consult received- Walden FieldCarol Bayer, [phone]-[employer]-- she Provides aid in their community and gives updates from [place] to community language. She feels pt and family may not be understanding information being given-- and need for interpreter may be best for any important information shared with pt/family.  CM will continue to follow for any d/c needs.    Zenda AlpersWebster, FosterKristi Hall, RN 06/12/2017, 10:45 AM [phone]

## 2017-06-12 NOTE — Op Note (Signed)
NAME:  [name] [name], [name] [name]                        ACCOUNT NO.:  MEDICAL RECORD NO.:  [id number]  LOCATION:                                 FACILITY:  PHYSICIAN:  Antony Contras, MD     DATE OF BIRTH:  [birth year]  DATE OF PROCEDURE:  06/16/2017 DATE OF DISCHARGE:                              OPERATIVE REPORT   PREOPERATIVE DIAGNOSIS:  Right neck, eyelid, face, and parapharyngeal abscess.  POSTOPERATIVE DIAGNOSIS:  Right neck, eyelid, face, and parapharyngeal abscess.  PROCEDURES: 1. Incision and drainage of right neck deep space abscess. 2. Incision and drainage of right facial abscess. 3. Incision and drainage of eyelid abscess. 4. Transoral incision and drainage of right parapharyngeal abscess. 5. Tracheostomy.  SURGEON:  Antony Contras, MD.  ANESTHESIA:  General endotracheal anesthesia.  COMPLICATIONS:  None.  INDICATION:  The patient is a 61 year old, Asian male who has been hospitalized for about a week now with tense right facial edema and infectious signs, being treated with broad-spectrum antibiotics, but not showing much improvement.  Serial CT imaging has been performed and the scan this morning well demonstrated abscess formation in the above stated places.  He presents to the operating room for surgical management.  FINDINGS:  There was yellow pink pus encountered in every location where there was tense edema including surrounding the parotid gland on the right in the subcutaneous space, in the right neck in the subcutaneous as well as deep spaces, in the submental neck, in the lower and upper eyelids, intraorally from the right buccal mucosa, from the right upper lip and from the right parapharyngeal space.  There was breakdown of skin of the lower eyelid over the infection as well as the medial upper eyelid and spontaneous drainage from these locations.  There was also mucosal breakdown in the buccal mucosa along the inferior gingivobuccal sulcus in the right side  and the upper lip.  DESCRIPTION OF PROCEDURE:  The patient was identified in the holding room and informed consent having been obtained including discussion of risks, benefits, and alternatives, the patient was brought to the operating suite and put on the table in supine position.  Anesthesia then performed an awake transnasal fiberoptic intubation, please see their note for details.  The patient was then placed under general anesthesia.  A shoulder roll was placed and the tracheostomy incision was marked with a marking pen and injected with 1% lidocaine with 1:100,000 epinephrine.  The neck was prepped and draped in sterile fashion.  Incision was made with a 15 blade scalpel and extended to the subcutaneous tissue using Bovie electrocautery.  Some of the subcutaneous fat was removed.  The midline raphae of the strap muscles were then divided and the strap muscles were retracted on either side. The thyroid isthmus was then divided with Bovie electrocautery, exposing the anterior tracheal wall.  An incision was made between the second and third ring in the trachea using a 15 blade scalpel and was extended with scissors to either side.  A stay suture was placed around the ring above the trach site and around the ring below the trach site using 2-0  silk suture.  The endotracheal tube cuff was then deflated and the tube elevated above the trach site.  A #8 cuffed Shiley trach tube was then placed into the trach site and then the cuff inflated.  The inner cannula was placed and the anesthesia circuit was attached to the trach site and he was successfully ventilated.  The stay sutures were tied with 2 knots above and 1 knot below the trach site.  The trach tube was then secured to the anterior neck skin using 2-0 silk suture in 4 quadrants.  A trach dressing and Velcro trach tie were added.  Drapes were all removed and the patient was then repositioned with the head at 90 degrees from  anesthesia.  A lateral orbital rim incision was marked with a marking pen as was an infra-auricular neck incision and a submental neck incision.  All 3 sites were injected with 1% lidocaine with 1:100,000 epinephrine.  The face and neck were then prepped and draped in sterile fashion.  The lateral neck incision was made with a 15- blade scalpel and extended through the subcutaneous tissue using Bovie electrocautery.  Blunt dissection was then performed with hemostat and tonsil clamps, extending anteriorly superficial to the parotid gland and in the subcutaneous space.  Copious pus was encountered and culture swabs were taken from this location.  The entire plane was then explored with blunt dissection up towards the malar eminence as well as medially towards the upper lip area.  The zone was then copiously irrigated with saline using a red rubber catheter.  Next, the infraorbital rim incision was made lateral to the lateral canthus using 15 blade scalpel.  Again, blunt dissection was performed into the lower eyelid, entering the purulent space.  The thin skin over the lower eyelid was found to be broken down over the abscess base and is all connected to the lateral incision.  The upper medial eyelid also had an open space.  It was spontaneously draining pus and this was then explored with hemostat as well.  Copious pus was pushed out of these areas through the incision sites.  These places were irrigated as well.  At this point, the submental incision was made with a 15 blade scalpel and blunt dissection performed in this location in a lateral direction towards the lateral dissection area.  In addition, blunt dissection was then performed inferiorly in various directions, encountering pus in those areas as well.  At this point, through the lateral incision, dissection was then performed more deeply into the soft tissues of the neck with blunt dissection and additional pus was encountered.   At this point, the mouth was explored and the buccal mucosa was examined.  The open area was then suctioned and extended a little bit anteriorly using Bovie electrocautery.  Blunt dissection was then performed through this open area into the soft tissues of the cheek where additional pus was encountered and drained.  The same was then done with the upper lip without any need for incision and the space was explored with blunt dissection as well, yielding pus.  At this point, a Crowe-Davis retractor was inserted into the mouth and opened to view the oropharynx. This was placed in suspension on Mayo stand.  A curvilinear incision was made above the right tonsil using Bovie electrocautery and blunt dissection was then performed into the parapharyngeal space where a pocket of pus was easily encountered as well.  This was copiously irrigated with saline after exploring the space with  a blunt dissection. At this point, Penrose drains were placed including quarter-inch Penrose drains and the lateral lower eyelid incision and medial upper eyelid site.  The lower lid Penrose was secured with a 2-0 nylon suture in the skin.  The medial upper lid drain was secured with a 4-0 nylon suture. Half-inch Penrose drains were then placed in the lateral neck incision site with 1 extending more superiorly into the preparotid region and secured with 2-0 nylon suture.  Another was then placed inferiorly into the deep neck space, secured with the same type of suture.  Two half- inch Penrose drains were then placed in the submental incision with 1 extending inferiorly and 1 extending laterally and both secured with 2-0 nylon suture.  The buccal space wound was then packed with Kerlix soaked in saline.  The same was done with the upper lip site.  At this point, the drapes were removed and the patient was turned back to Anesthesia for further care.  A Kerlix fluff dressing was placed around the neck as well as a  fluff placed over the right eye area.  These were secured with tape.  At this point also, a feeding tube was placed in the left nasal passage down into the stomach and secured on the nose using tape.  Anesthesia was then going to perform a central line placement and a Foley catheter was to be placed by nursing.  He was then moved to the intensive care unit in stable condition.     Antony Contraswight D Keyauna Graefe, MD     DDB/MEDQ  D:  Dec 24, 2016  T:  Dec 24, 2016  Job:  [id number]  cc:   Antony Contraswight D Jakyle Petrucelli, MD's Office

## 2017-06-12 NOTE — Consult Note (Signed)
[facility]  Total days of antibiotics 10        Day 3 piptazo        Day 3 vanco               Reason for Consult:complex parotitis with retropharyngeal abscess    Referring Physician: Vaughan Browner  Active Problems:   Acute parotitis   Facial cellulitis   Acute respiratory failure (HCC)    HPI: [name] [name] is a 61 y.o. male with hx of DM, HTN, Hep C was admitted on 6/18 with marked rihgt sided facial swelling, fevers, chills, dyspnea found to have temp of 1086F, labs showing leukocytosis of 20K, CT showing right parotitis, and facial cellulitis on 6/18. He was started on clindamycin and amp/sub. ENT first recommended IV abtx and that there was no drainable fluid collection. On 6/22 he had repeat CT that now fluid filled collections thsu vancomycin was added to his medical management since WBC still not improved, he underwent repeat CT on 6/25 that showed worsening of inflammation process to right parotic and submandibular glands as well as fluid collection in right parotid gland, right periorbital preseptal region as well as right cheek plus it showed 3 x 6 cm parapharyngeal abscess. He went to the OR last night for Ix D as well as placement of penrose drains. He remains intubated in order to protect his airway. Or cx show GNR on gram stain. His WBC is elevated at 16K up from 14 from the day prior. He had post op fever of 103.86F but now afebrile. His abtx have been vanco and piptazo since 6/23. Post operatively, he remains sedated, intubated, multiple drains in place  Past Medical History:  Diagnosis Date  . Diabetes mellitus without complication ([place])   . Hepatitis C   . Hypertension     Allergies: No Known Allergies   MEDICATIONS: . chlorhexidine gluconate (MEDLINE KIT)  15 mL Mouth Rinse BID  . fentaNYL (SUBLIMAZE) injection  50 mcg Intravenous Once  . insulin aspart  0-20 Units Subcutaneous Q4H  . insulin glargine  18 Units Subcutaneous BID  . mouth rinse   15 mL Mouth Rinse 10 times per day  . methylPREDNISolone (SOLU-MEDROL) injection  60 mg Intravenous Q12H  . mupirocin ointment   Topical BID  . pantoprazole sodium  40 mg Per Tube Daily    Social History  Substance Use Topics  . Smoking status: Former Smoker    Quit date: 05/09/1996  . Smokeless tobacco: Never Used  . Alcohol use 3.0 oz/week    5 Glasses of wine per week     Comment: social    History reviewed. No pertinent family history.  Review of Systems  Unable to obtain since patient is sedated   OBJECTIVE: Temp:  [97.6 F (36.4 C)-103.3 F (39.6 C)] 100.9 F (38.3 C) (06/26 0736) Pulse Rate:  [51-84] 71 (06/26 0900) Resp:  [12-26] 16 (06/26 0900) BP: (101-202)/(54-110) 104/56 (06/26 0900) SpO2:  [96 %-100 %] 99 % (06/26 0900) FiO2 (%):  [40 %-100 %] 40 % (06/26 0800) Weight:  [153 lb 7 oz (69.6 kg)] 153 lb 7 oz (69.6 kg) (06/26 0400) Physical Exam  Constitutional: He is sedated. He appears well-developed and asymmetric face due to infection  HENT:facial asymmetry, right side of face is enlarged.eyes is patched with penrose drain from mouth/cheekOETT in place, dobhoff in place Mouth/Throat: blood tinged dressing in mouth Cardiovascular: Normal rate, regular rhythm and normal heart sounds. Exam  reveals no gallop and no friction rub.  No murmur heard.  Pulmonary/Chest: Effort normal and breath sounds normal. No respiratory distress. He has no wheezes.  Abdominal: Soft. Bowel sounds are decreasedHe exhibits no distension. There is no tenderness.  Neurological: moves extremities spontaneously Ext: +1 edema to upper extremities bilaterally    LABS: Results for orders placed or performed during the hospital encounter of 05/22/2017 (from the past 48 hour(s))  Glucose, capillary     Status: Abnormal   Collection Time: 06/10/17 12:10 PM  Result Value Ref Range   Glucose-Capillary 287 (H) 65 - 99 mg/dL  Glucose, capillary     Status: Abnormal   Collection Time: 06/10/17   4:02 PM  Result Value Ref Range   Glucose-Capillary 291 (H) 65 - 99 mg/dL  Glucose, capillary     Status: Abnormal   Collection Time: 06/10/17  8:36 PM  Result Value Ref Range   Glucose-Capillary 215 (H) 65 - 99 mg/dL  Glucose, capillary     Status: Abnormal   Collection Time: 05/21/2017 12:28 AM  Result Value Ref Range   Glucose-Capillary 233 (H) 65 - 99 mg/dL   Comment 1 Notify RN   CBC with Differential/Platelet     Status: Abnormal   Collection Time: 05/19/2017  1:48 AM  Result Value Ref Range   WBC 14.3 (H) 4.0 - 10.5 K/uL   RBC 4.02 (L) 4.22 - 5.81 MIL/uL   Hemoglobin 9.2 (L) 13.0 - 17.0 g/dL   HCT 30.2 (L) 39.0 - 52.0 %   MCV 75.1 (L) 78.0 - 100.0 fL   MCH 22.9 (L) 26.0 - 34.0 pg   MCHC 30.5 30.0 - 36.0 g/dL   RDW 15.1 11.5 - 15.5 %   Platelets 161 150 - 400 K/uL   Neutrophils Relative % 89 %   Lymphocytes Relative 5 %   Monocytes Relative 6 %   Eosinophils Relative 0 %   Basophils Relative 0 %   Neutro Abs 12.7 (H) 1.7 - 7.7 K/uL   Lymphs Abs 0.7 0.7 - 4.0 K/uL   Monocytes Absolute 0.9 0.1 - 1.0 K/uL   Eosinophils Absolute 0.0 0.0 - 0.7 K/uL   Basophils Absolute 0.0 0.0 - 0.1 K/uL   RBC Morphology POLYCHROMASIA PRESENT     Comment: RARE NRBCs TARGET CELLS    WBC Morphology TOXIC GRANULATION     Comment: ATYPICAL LYMPHOCYTES  Comprehensive metabolic panel     Status: Abnormal   Collection Time: 06/03/2017  1:48 AM  Result Value Ref Range   Sodium 139 135 - 145 mmol/L   Potassium 3.7 3.5 - 5.1 mmol/L   Chloride 108 101 - 111 mmol/L   CO2 24 22 - 32 mmol/L   Glucose, Bld 253 (H) 65 - 99 mg/dL   BUN 15 6 - 20 mg/dL   Creatinine, Ser 0.75 0.61 - 1.24 mg/dL   Calcium 7.9 (L) 8.9 - 10.3 mg/dL   Total Protein 6.3 (L) 6.5 - 8.1 g/dL   Albumin 1.8 (L) 3.5 - 5.0 g/dL   AST 57 (H) 15 - 41 U/L   ALT 40 17 - 63 U/L   Alkaline Phosphatase 96 38 - 126 U/L   Total Bilirubin 3.0 (H) 0.3 - 1.2 mg/dL   GFR calc non Af Amer >60 >60 mL/min   GFR calc Af Amer >60 >60 mL/min     Comment: (NOTE) The eGFR has been calculated using the CKD EPI equation. This calculation has not been validated in all clinical  situations. eGFR's persistently <60 mL/min signify possible Chronic Kidney Disease.    Anion gap 7 5 - 15  Glucose, capillary     Status: Abnormal   Collection Time: 05/31/2017  4:13 AM  Result Value Ref Range   Glucose-Capillary 281 (H) 65 - 99 mg/dL   Comment 1 Notify RN   Glucose, capillary     Status: Abnormal   Collection Time: 06/09/2017  7:43 AM  Result Value Ref Range   Glucose-Capillary 241 (H) 65 - 99 mg/dL  Surgical pcr screen     Status: None   Collection Time: 05/25/2017 10:22 AM  Result Value Ref Range   MRSA, PCR NEGATIVE NEGATIVE   Staphylococcus aureus NEGATIVE NEGATIVE    Comment:        The Xpert SA Assay (FDA approved for NASAL specimens in patients over 44 years of age), is one component of a comprehensive surveillance program.  Test performance has been validated by [facility] for patients greater than or equal to 68 year old. It is not intended to diagnose infection nor to guide or monitor treatment.   Glucose, capillary     Status: Abnormal   Collection Time: 05/23/2017 12:06 PM  Result Value Ref Range   Glucose-Capillary 206 (H) 65 - 99 mg/dL  Glucose, capillary     Status: Abnormal   Collection Time: 05/31/2017  3:07 PM  Result Value Ref Range   Glucose-Capillary 196 (H) 65 - 99 mg/dL  Aerobic/Anaerobic Culture (surgical/deep wound)     Status: None (Preliminary result)   Collection Time: 06/10/2017  6:18 PM  Result Value Ref Range   Specimen Description ABSCESS RIGHT NECK    Special Requests NONE    Gram Stain      MODERATE WBC PRESENT, PREDOMINANTLY PMN MODERATE GRAM NEGATIVE RODS    Culture PENDING    Report Status PENDING   MRSA PCR Screening     Status: None   Collection Time: 06/13/2017  8:30 PM  Result Value Ref Range   MRSA by PCR NEGATIVE NEGATIVE    Comment:        The GeneXpert MRSA Assay (FDA approved for  NASAL specimens only), is one component of a comprehensive MRSA colonization surveillance program. It is not intended to diagnose MRSA infection nor to guide or monitor treatment for MRSA infections.   Glucose, capillary     Status: Abnormal   Collection Time: 06/13/2017  9:37 PM  Result Value Ref Range   Glucose-Capillary 203 (H) 65 - 99 mg/dL   Comment 1 Capillary Specimen   Glucose, capillary     Status: Abnormal   Collection Time: 05/20/2017 11:21 PM  Result Value Ref Range   Glucose-Capillary 166 (H) 65 - 99 mg/dL   Comment 1 Capillary Specimen   Triglycerides     Status: Abnormal   Collection Time: 06/12/17  1:07 AM  Result Value Ref Range   Triglycerides 230 (H) <150 mg/dL  Glucose, capillary     Status: Abnormal   Collection Time: 06/12/17  4:29 AM  Result Value Ref Range   Glucose-Capillary 121 (H) 65 - 99 mg/dL   Comment 1 Capillary Specimen   Basic metabolic panel     Status: Abnormal   Collection Time: 06/12/17  4:58 AM  Result Value Ref Range   Sodium 137 135 - 145 mmol/L   Potassium 3.8 3.5 - 5.1 mmol/L   Chloride 104 101 - 111 mmol/L   CO2 26 22 - 32 mmol/L   Glucose,  Bld 128 (H) 65 - 99 mg/dL   BUN 12 6 - 20 mg/dL   Creatinine, Ser 0.95 0.61 - 1.24 mg/dL   Calcium 7.6 (L) 8.9 - 10.3 mg/dL   GFR calc non Af Amer >60 >60 mL/min   GFR calc Af Amer >60 >60 mL/min    Comment: (NOTE) The eGFR has been calculated using the CKD EPI equation. This calculation has not been validated in all clinical situations. eGFR's persistently <60 mL/min signify possible Chronic Kidney Disease.    Anion gap 7 5 - 15  Magnesium     Status: None   Collection Time: 06/12/17  4:58 AM  Result Value Ref Range   Magnesium 1.8 1.7 - 2.4 mg/dL  Phosphorus     Status: Abnormal   Collection Time: 06/12/17  4:58 AM  Result Value Ref Range   Phosphorus 2.4 (L) 2.5 - 4.6 mg/dL  Blood gas, arterial     Status: Abnormal   Collection Time: 06/12/17  5:00 AM  Result Value Ref Range    FIO2 40.00    Delivery systems VENTILATOR    Mode PRESSURE REGULATED VOLUME CONTROL    VT 400 mL   LHR 16 resp/min   Peep/cpap 5.0 cm H20   pH, Arterial 7.482 (H) 7.350 - 7.450   pCO2 arterial 35.9 32.0 - 48.0 mmHg   pO2, Arterial 111 (H) 83.0 - 108.0 mmHg   Bicarbonate 26.6 20.0 - 28.0 mmol/L   Acid-Base Excess 3.2 (H) 0.0 - 2.0 mmol/L   O2 Saturation 98.8 %   Patient temperature 98.6    Collection site RIGHT RADIAL    Drawn by [id number]    Sample type ARTERIAL DRAW    Allens test (pass/fail) PASS PASS  CBC     Status: Abnormal   Collection Time: 06/12/17  6:04 AM  Result Value Ref Range   WBC 15.8 (H) 4.0 - 10.5 K/uL    Comment: CONSISTENT WITH PREVIOUS RESULT   RBC 3.43 (L) 4.22 - 5.81 MIL/uL   Hemoglobin 7.9 (L) 13.0 - 17.0 g/dL    Comment: REPEATED TO VERIFY   HCT 26.6 (L) 39.0 - 52.0 %   MCV 77.6 (L) 78.0 - 100.0 fL   MCH 23.0 (L) 26.0 - 34.0 pg   MCHC 29.7 (L) 30.0 - 36.0 g/dL   RDW 15.2 11.5 - 15.5 %   Platelets 125 (L) 150 - 400 K/uL    Comment: REPEATED TO VERIFY  Glucose, capillary     Status: Abnormal   Collection Time: 06/12/17  7:34 AM  Result Value Ref Range   Glucose-Capillary 110 (H) 65 - 99 mg/dL   Comment 1 Notify RN     MICRO: 6/25 neck abscess = GNR IMAGING: Ct Soft Tissue Neck W Contrast  Result Date: 05/27/2017 CLINICAL DATA:  Followup parotiditis. EXAM: CT NECK WITH CONTRAST TECHNIQUE: Multidetector CT imaging of the neck was performed using the standard protocol following the bolus administration of intravenous contrast. CONTRAST:  75 cc Isovue-300 COMPARISON:  06/08/2017, 06/13/2017 FINDINGS: There is further worsening by imaging. Patient appears to have diffuse suppurative inflammation of the right parotid and submandibular glands. Diffuse cellulitis/abscess formation throughout the right face and deep spaces with specific findings as below. Low-density fluid, presumably abscess, superficial to the right parotid gland with thickness measuring up to  16 mm. Low-density fluid, presumably abscess, right periorbital preseptal region measuring up to 2 cm in thickness. More superficial subcutaneous fluid in the right cheek measuring up to 17 mm  in thickness. Right parapharyngeal space abscess much larger, measuring 3.5 x 2.7 cm and extending over a length of almost 6 cm, with mass effect upon the oropharynx and hypopharynx. Abscess within the right submandibular gland extending into the sublingual region. No sign of intracranial extension in the limited intracranial images. No sign of postseptal orbital inflammation on the limited images. Diffuse mucosal inflammation of the right maxillary sinus. No layering fluid. Some dental and periodontal disease, but without advanced periodontal disease/abscess of bone. IMPRESSION: Continued worsening of inflammatory disease of the right face and deep spaces. Most notable changes include enlargement of the right parapharyngeal abscess now measuring up to 3.5 cm in diameter and extending over a length of almost 6 cm with worsened mass effect upon the hypopharynx and pharynx. See above for description of other locations of involvement/extension. Electronically Signed   By: Nelson Chimes M.D.   On: 05/18/2017 09:17   Dg Chest Port 1 View  Result Date: 06/01/2017 CLINICAL DATA:  Status post tracheostomy. EXAM: PORTABLE CHEST 1 VIEW COMPARISON:  Chest x-ray 06/08/2017 FINDINGS: The tip of the tracheostomy tube is 4 cm above the carina. No complicating features. The cardiac silhouette, mediastinal and hilar contours are within normal limits and stable. The lungs are clear of acute process. Possible small pleural effusions. The bony thorax is intact. IMPRESSION: Tracheostomy tube in good position without complicating features. Electronically Signed   By: Marijo Sanes M.D.   On: 06/02/2017 21:41   Dg Abd Portable 1v  Result Date: 06/16/2017 CLINICAL DATA:  NG tube placement EXAM: PORTABLE ABDOMEN - 1 VIEW COMPARISON:  None.  FINDINGS: Weighted NG tube tip overlies the gastric body. Nonobstructive bowel gas pattern. Lung bases are clear. IMPRESSION: NG tube tip overlying the gastric body. Electronically Signed   By: Ulyses Jarred M.D.   On: 05/18/2017 21:41    Assessment/Plan:  61yo M with initial parotitis and right sided facial cellulitis progressed on broad spectrum abtx developing multiple abscess including right parapharyngeal abscess s/p Ix D currently on vancomycin plus piptazo. Though cx showing GNR, it has features of staph/strep deep tissue infection. Likely polymicrobial given origins from mouth/orapharynx  - continue with vanco and piptazo will also add clindamycin  High dose over the next 48-72hr to see if any improvement with leukocytosis  - leukocytosis = repeat cbc with diff to see if toxic granulation is improving as response to infection  - fever= likely from debridement, if repeat fever, please get repeat blood cx   - chronic hep c without hepatic coma = AST/ALT WNL range  - type 2 dm = keep under moderate BS control to help with management of infection

## 2017-06-13 ENCOUNTER — Inpatient Hospital Stay (HOSPITAL_COMMUNITY): Payer: 59

## 2017-06-13 LAB — BASIC METABOLIC PANEL
Anion gap: 6 (ref 5–15)
BUN: 18 mg/dL (ref 6–20)
CHLORIDE: 103 mmol/L (ref 101–111)
CO2: 28 mmol/L (ref 22–32)
Calcium: 7.5 mg/dL — ABNORMAL LOW (ref 8.9–10.3)
Creatinine, Ser: 0.92 mg/dL (ref 0.61–1.24)
GFR calc Af Amer: >60 mL/min (ref >60)
GFR calc non Af Amer: >60 mL/min (ref >60)
Glucose, Bld: 252 mg/dL — ABNORMAL HIGH (ref 65–99)
POTASSIUM: 3.9 mmol/L (ref 3.5–5.1)
SODIUM: 137 mmol/L (ref 135–145)

## 2017-06-13 LAB — DIFFERENTIAL
BASOS ABS: 0 10*3/uL (ref 0.0–0.1)
Basophils Relative: 0 %
EOS PCT: 0 %
Eosinophils Absolute: 0 10*3/uL (ref 0.0–0.7)
LYMPHS ABS: 0.9 10*3/uL (ref 0.7–4.0)
Lymphocytes Relative: 5 %
MONO ABS: 0.7 10*3/uL (ref 0.1–1.0)
MONOS PCT: 4 %
NEUTROS ABS: 17.1 10*3/uL — AB (ref 1.7–7.7)
Neutrophils Relative %: 91 %

## 2017-06-13 LAB — CBC
HCT: 26.3 % — ABNORMAL LOW (ref 39.0–52.0)
HEMOGLOBIN: 7.4 g/dL — AB (ref 13.0–17.0)
MCH: 23.1 pg — AB (ref 26.0–34.0)
MCHC: 28.1 g/dL — ABNORMAL LOW (ref 30.0–36.0)
MCV: 82.2 fL (ref 78.0–100.0)
Platelets: 118 10*3/uL — ABNORMAL LOW (ref 150–400)
RBC: 3.2 MIL/uL — AB (ref 4.22–5.81)
RDW: 17.5 % — ABNORMAL HIGH (ref 11.5–15.5)
WBC: 18.7 10*3/uL — ABNORMAL HIGH (ref 4.0–10.5)

## 2017-06-13 LAB — GLUCOSE, CAPILLARY
GLUCOSE-CAPILLARY: 251 mg/dL — AB (ref 65–99)
Glucose-Capillary: 226 mg/dL — ABNORMAL HIGH (ref 65–99)
Glucose-Capillary: 223 mg/dL — ABNORMAL HIGH (ref 65–99)
Glucose-Capillary: 226 mg/dL — ABNORMAL HIGH (ref 65–99)
Glucose-Capillary: 233 mg/dL — ABNORMAL HIGH (ref 65–99)

## 2017-06-13 LAB — PHOSPHORUS: Phosphorus: 3.6 mg/dL (ref 2.5–4.6)

## 2017-06-13 LAB — LACTIC ACID, PLASMA
Lactic Acid, Venous: 2.9 mmol/L (ref 0.5–1.9)
Lactic Acid, Venous: 2.2 mmol/L (ref 0.5–1.9)

## 2017-06-13 LAB — C DIFFICILE QUICK SCREEN W PCR REFLEX
C DIFFICILE (CDIFF) INTERP: NOT DETECTED
C DIFFICILE (CDIFF) TOXIN: NEGATIVE
C DIFFICLE (CDIFF) ANTIGEN: NEGATIVE

## 2017-06-13 LAB — MAGNESIUM: MAGNESIUM: 1.9 mg/dL (ref 1.7–2.4)

## 2017-06-13 MED ORDER — CHLORHEXIDINE GLUCONATE CLOTH 2 % EX PADS
6.0000 | MEDICATED_PAD | Freq: Every day | CUTANEOUS | Status: DC
  Administered 2017-06-14 – 2017-06-24 (×11): 6 via TOPICAL

## 2017-06-13 MED ORDER — SODIUM CHLORIDE 0.9% FLUSH
10.0000 mL | Freq: Two times a day (BID) | INTRAVENOUS | Status: DC
  Administered 2017-06-13 – 2017-06-20 (×11): 10 mL
  Administered 2017-06-20: 20 mL
  Administered 2017-06-21 – 2017-06-25 (×7): 10 mL

## 2017-06-13 MED ORDER — INSULIN GLARGINE 100 UNIT/ML ~~LOC~~ SOLN
20.0000 [IU] | Freq: Two times a day (BID) | SUBCUTANEOUS | Status: DC
  Administered 2017-06-13 (×2): 20 [IU] via SUBCUTANEOUS
  Filled 2017-06-13 (×3): qty 0.2

## 2017-06-13 MED ORDER — SODIUM CHLORIDE 0.9 % IV SOLN
3.0000 g | Freq: Four times a day (QID) | INTRAVENOUS | Status: DC
  Administered 2017-06-13 – 2017-06-17 (×15): 3 g via INTRAVENOUS
  Filled 2017-06-13 (×17): qty 3

## 2017-06-13 MED ORDER — SODIUM CHLORIDE 0.9 % IV BOLUS (SEPSIS)
500.0000 mL | Freq: Once | INTRAVENOUS | Status: AC
  Administered 2017-06-13: 500 mL via INTRAVENOUS

## 2017-06-13 MED ORDER — SODIUM CHLORIDE 0.9% FLUSH: 10.0000 mL | INTRAVENOUS | Status: DC | PRN

## 2017-06-13 NOTE — Progress Notes (Signed)
After reviewing chart, pt noted to have negative blood cx from 6/18 after elevated lactic acid level that day (5.26 on 6/18 at 0958, down to 2.2 on 6/19). Pt's lactic acid at 23:24 6/26 is 2.9 s/p I&D and trach placement 6/25.  Pt does exhibit intermittent fevers. Pt already on clindamycin, Zosyn, and vancomycin.  Discussed situation with Dr. Molli KnockYacoub via [facility], said to monitor for now and address with MD during morning rounds.   Will continue to monitor.  Peyton Bottomsachel R Norris Brumbach, RN 06/13/17

## 2017-06-13 NOTE — Progress Notes (Signed)
Peripherally Inserted Central Catheter/Midline Placement  The IV Nurse has discussed with the patient and/or persons authorized to consent for the patient, the purpose of this procedure and the potential benefits and risks involved with this procedure.  The benefits include less needle sticks, lab draws from the catheter, and the patient may be discharged home with the catheter. Risks include, but not limited to, infection, bleeding, blood clot (thrombus formation), and puncture of an artery; nerve damage and irregular heartbeat and possibility to perform a PICC exchange if needed/ordered by physician.  Alternatives to this procedure were also discussed.  Bard Power PICC patient education guide, fact sheet on infection prevention and patient information card has been provided to patient /or left at bedside.    PICC/Midline Placement Documentation  PICC Double Lumen 06/13/17 PICC Right Brachial 35 cm 0 cm (Active)  Indication for Insertion or Continuance of Line Prolonged intravenous therapies 06/13/2017  7:11 PM  Exposed Catheter (cm) 0 cm 06/13/2017  7:11 PM  Site Assessment Clean;Dry;Intact 06/13/2017  7:11 PM  Lumen #1 Status Flushed;Saline locked;Blood return noted 06/13/2017  7:11 PM  Lumen #2 Status Flushed;Saline locked;Blood return noted 06/13/2017  7:11 PM  Dressing Type Transparent;Securing device 06/13/2017  7:11 PM  Dressing Status Clean;Dry;Intact;Antimicrobial disc in place 06/13/2017  7:11 PM  Dressing Change Due 06/20/17 06/13/2017  7:11 PM       [name] [name], [name] [name] 06/13/2017, 7:13 PM

## 2017-06-13 NOTE — Progress Notes (Signed)
Paged dietitian/cortrek team to advance feeding tube per xray's note. Cortrek team came to advance and realized this is not a cortrek and due to the facial surgery/trauma, they are not allowed to change out and place a feeding tube per protocol. RN called MD Jenne PaneBates office to inform him of the need to advance the feeding tube. RN spoke with MD Jenne PaneBates nurse as Jenne PaneBates was not there at that time. Will continue to monitor.

## 2017-06-13 NOTE — Progress Notes (Signed)
RN Ann MakiMegan Yatziry Deakins received call from MD Christia Readingwight [facility] office nurse Berenda Morale(Tambra Kyle, RN) instructing RN Fredric MareBailey to advance pt's feeding tube per x-ray recommendation.  RN Fredric MareBailey informed RN Ronaldo MiyamotoKyle that RN Fredric MareBailey is not trained on these feeding tube placements and this is beyond [employer] scope of practice, but would follow MD Jenne PaneBates orders. RN Fredric MareBailey called the cortrk team and they were also unfamiliar with this type of feeding tube, and because pt has facial surgery, they are also not allowed per protocol to advance or replace the tube. RN Fredric MareBailey advanced tube to 60 and put in portable abdominal xray. Will continue to monitor.

## 2017-06-13 NOTE — Progress Notes (Signed)
Inpatient Diabetes Program Recommendations  AACE/ADA: New Consensus Statement on Inpatient Glycemic Control (2015)  Target Ranges:  Prepandial:   less than 140 mg/dL      Peak postprandial:   less than 180 mg/dL (1-2 hours)      Critically ill patients:  140 - 180 mg/dL   Results for [name], [facility] (MRN [id number]) as of 06/13/2017 09:46  Ref. Range 06/12/2017 19:43 06/12/2017 23:38 06/13/2017 03:39 06/13/2017 07:46  Glucose-Capillary Latest Ref Range: 65 - 99 mg/dL 409256 (H) 811236 (H) 914226 (H) 251 (H)   Review of Glycemic Control  Diabetes history: Type 2 diabetes Outpatient Diabetes medications: Metformin 500 mg bid, Glucotrol 5 mg daily Current orders for Inpatient glycemic control:  Lantus 18 units bid, Novolog resistant q 4 hours  Inpatient Diabetes Program Recommendations:  Glucose consistently in the 200's. Patient receiving Tube Feeds. Please consider ordering Novolog 4 units Q4H for tube feeding coverage (in addition to Novolog correction scale).  Thanks,  Christena DeemShannon Deija Buhrman RN, MSN, [facility] Inpatient Diabetes Coordinator Team Pager [phone] (8a-5p)

## 2017-06-13 NOTE — Progress Notes (Signed)
   Subjective:    Patient ID: [name] [name], male    DOB: [birth year], 61 y.o.   MRN: [id number]  HPI Continues sedated on ventilator.  Review of Systems     Objective:   Physical Exam Tm 102.4 VSS Sedated Changed mouth packing and dressings.  Pink pus expressed through neck drain sites primarily.  Penrose drains all in place.  Right lower lid skin breakdown.  Some early breakdown of skin at jowl near submental drains.  Trach site stable with cuff inflated.    Assessment & Plan:  Right parotid, eyelid, neck, parapharyngeal abscess s/p I&Ds and trach  Discussed patient with CCM.  Agree with stopping steroids.  Continue dressing changes, reviewed with nursing.  Broad spectrum antibiotics.  Repeat CT tomorrow.

## 2017-06-13 NOTE — Progress Notes (Signed)
PULMONARY / CRITICAL CARE MEDICINE   Name: [name] [name] MRN: [id number] DOB: May 10, 1956    ADMISSION DATE:  05/23/2017 CONSULTATION DATE:  07/11/2017  REFERRING MD:  Dr. Sharon Seller  CHIEF COMPLAINT:  Right parapharyngeal and neck abscesses  HISTORY OF PRESENT ILLNESS:  Patient is sedated and on mechanical ventilation and therefore HPI obtained from chart review.   61 year old male with PMH as below, which is significant for DM, HTN, and Hepatitis C admitted on 6/18 with right parotitis and facial swelling.  Patient can speak some conversational English, otherwise speaks "Proofreader" language.  Patient was in his usual state of health until 6/15 when he developed right sided facial swelling with progressive fever and chills.  He was seen at urgent care 6/17 and started on clindamycin however was unable to continue due to progressive dysphagia.   CT showed right parotitis but no drainable abscess.  He was then admitted to the ICU under PCCM care for airway monitoring and continued on Clindamycin and Unasyn.  ENT was consulted with recommendations for continued antibiotic coverage.  Not a surgical candidate due to no drainable abscess. Patient was transferred to [facility] service for further monitoring on 6/20.  Repeat CT imaging on 6/22 with ill defined fluid filled collections but no discrete rim enhancing abscess.  Vancomycin was added 6/23.  Repeat CT on 6/25 showed a 3.5 cm right parapharyngeal abscess and possible fluid collection sin the right eyelid and parotid space.  Therefore, he was taken to the OR on 6/25 by ENT for I&D with placement of penrose drains to the upper and lower eyelids, right neck and submental neck.  A tracheostomy placement was performed for airway protection.  Patient to remained sedated overnight and transferred to ICU on mechanical ventilation.  PCCM to resume primary care.    PAST MEDICAL HISTORY :  He  has a past medical history of Diabetes mellitus without complication (HCC);  Hepatitis C; and Hypertension.  PAST SURGICAL HISTORY: He  has a past surgical history that includes Flexible sigmoidoscopy (N/A, 09/10/2015); Incision and drainage abscess (Right, Jun 17, 2017); and Tracheostomy tube placement (N/A, 06/18/2017).  No Known Allergies  No current facility-administered medications on file prior to encounter.    Current Outpatient Prescriptions on File Prior to Encounter  Medication Sig  . clindamycin (CLEOCIN) 150 MG capsule Take 1 capsule (150 mg total) by mouth every 6 (six) hours.  Marland Kitchen glipiZIDE (GLUCOTROL) 5 MG tablet Take 1 tablet (5 mg total) by mouth daily before breakfast.  . HYDROcodone-acetaminophen (NORCO/VICODIN) 5-325 MG tablet Take 1-2 tablets by mouth every 4 (four) hours as needed.  . metFORMIN (GLUCOPHAGE) 500 MG tablet Take 1 tablet (500 mg total) by mouth 2 (two) times daily with a meal.  . azithromycin (ZITHROMAX) 250 MG tablet Take 2 tabs PO x 1 dose, then 1 tab PO QD x 4 days (Patient not taking: Reported on 06/07/2017)  . dicyclomine (BENTYL) 20 MG tablet Take 1 tablet (20 mg total) by mouth 2 (two) times daily. (Patient not taking: Reported on 03/09/2016)  . docusate sodium (COLACE) 100 MG capsule Take 1 capsule (100 mg total) by mouth 2 (two) times daily. (Patient not taking: Reported on 09/14/2015)  . HYDROcodone-homatropine (HYCODAN) 5-1.5 MG/5ML syrup Take 5 mLs by mouth every 8 (eight) hours as needed for cough. (Patient not taking: Reported on 06/02/2017)  . omeprazole (PRILOSEC) 20 MG capsule Take 1 capsule (20 mg total) by mouth 2 (two) times daily before a meal. (Patient not taking: Reported on  09/14/2015)  . oxyCODONE (OXY IR/ROXICODONE) 5 MG immediate release tablet Take 1 tablet (5 mg total) by mouth every 6 (six) hours as needed for moderate pain. (Patient not taking: Reported on 03/09/2016)  . polyethylene glycol (MIRALAX / GLYCOLAX) packet Take 17 g by mouth daily as needed for mild constipation. (Patient not taking: Reported on 03/09/2016)   . tamsulosin (FLOMAX) 0.4 MG CAPS capsule Take 1 capsule (0.4 mg total) by mouth daily. (Patient not taking: Reported on 05/24/2017)    FAMILY HISTORY:  His indicated that his mother is deceased. He indicated that his father is deceased.    SOCIAL HISTORY: He  reports that he quit smoking about 21 years ago. He has never used smokeless tobacco. He reports that he drinks about 3.0 oz of alcohol per week . He reports that he does not use drugs.  REVIEW OF SYSTEMS:  Unable to assess as patient is sedated and on mechanical ventilation.  SUBJECTIVE:  Stable overnight. Maintained on vent.   VITAL SIGNS: BP 97/60 (BP Location: Right Arm)   Pulse (!) 58   Temp 99.3 F (37.4 C) (Axillary)   Resp 15   Ht 5' (1.524 m)   Wt 150 lb 9.2 oz (68.3 kg)   SpO2 98%   BMI 29.41 kg/m   HEMODYNAMICS:    VENTILATOR SETTINGS: Vent Mode: PRVC FiO2 (%):  [40 %] 40 % Set Rate:  [16 bmp] 16 bmp Vt Set:  [400 mL] 400 mL PEEP:  [5 cmH20] 5 cmH20 Pressure Support:  [5 cmH20-10 cmH20] 5 cmH20 Plateau Pressure:  [8 cmH20-16 cmH20] 15 cmH20  INTAKE / OUTPUT: I/O last 3 completed shifts: In: 4464.1 [I.V.:2585.8; Other:10; NG/GT:968.3; IV Piggyback:900] Out: 3230 [Urine:3180; Emesis/NG output:50]  PHYSICAL EXAMINATION: Gen:      No acute distress HEENT:  EOMI, sclera anicteric, trach, right face swelling with penrose drains Neck:     No masses; no thyromegaly Lungs:    Clear to auscultation bilaterally; normal respiratory effort CV:         Regular rate and rhythm; no murmurs Abd:      + bowel sounds; soft, non-tender; no palpable masses, no distension Ext:    No edema; adequate peripheral perfusion Skin:      Warm and dry; no rash Neuro: Sedated, intubated  LABS:  BMET  Recent Labs Lab 05/28/2017 0148 06/12/17 0458 06/13/17 0415  NA 139 137 137  K 3.7 3.8 3.9  CL 108 104 103  CO2 24 26 28   BUN 15 12 18   CREATININE 0.75 0.95 0.92  GLUCOSE 253* 128* 252*    Electrolytes  Recent  Labs Lab 06/09/17 0405  06/04/2017 0148 06/12/17 0458 06/13/17 0415  CALCIUM 7.5*  < > 7.9* 7.6* 7.5*  MG 1.9  --   --  1.8 1.9  PHOS  --   --   --  2.4* 3.6  < > = values in this interval not displayed.  CBC  Recent Labs Lab 05/19/2017 0148 06/12/17 0604 06/13/17 0415  WBC 14.3* 15.8* 18.7*  HGB 9.2* 7.9* 7.4*  HCT 30.2* 26.6* 26.3*  PLT 161 125* 118*    Coag's No results for input(s): APTT, INR in the last 168 hours.  Sepsis Markers  Recent Labs Lab 06/12/17 2324  LATICACIDVEN 2.9*    ABG  Recent Labs Lab 06/12/17 0500  PHART 7.482*  PCO2ART 35.9  PO2ART 111*    Liver Enzymes  Recent Labs Lab 06/08/17 0712 06/09/17 0405 06/01/2017 0148  AST 254*  112* 57*  ALT 81* 58 40  ALKPHOS 60 50 96  BILITOT 4.7* 5.0* 3.0*  ALBUMIN 2.1* 1.8* 1.8*    Cardiac Enzymes  Recent Labs Lab 06/08/17 0248 06/08/17 0712 06/08/17 1205  TROPONINI 0.03* 0.06* <0.03    Glucose  Recent Labs Lab 06/12/17 1201 06/12/17 1606 06/12/17 1943 06/12/17 2338 06/13/17 0339 06/13/17 0746  GLUCAP 160* 175* 256* 236* 226* 251*   Imaging Ct Soft Tissue Neck W Contrast  Result Date: 05/22/2017 CLINICAL DATA:  Followup parotiditis. EXAM: CT NECK WITH CONTRAST TECHNIQUE: Multidetector CT imaging of the neck was performed using the standard protocol following the bolus administration of intravenous contrast. CONTRAST:  75 cc Isovue-300 COMPARISON:  06/08/2017, 05/26/2017 FINDINGS: There is further worsening by imaging. Patient appears to have diffuse suppurative inflammation of the right parotid and submandibular glands. Diffuse cellulitis/abscess formation throughout the right face and deep spaces with specific findings as below. Low-density fluid, presumably abscess, superficial to the right parotid gland with thickness measuring up to 16 mm. Low-density fluid, presumably abscess, right periorbital preseptal region measuring up to 2 cm in thickness. More superficial subcutaneous  fluid in the right cheek measuring up to 17 mm in thickness. Right parapharyngeal space abscess much larger, measuring 3.5 x 2.7 cm and extending over a length of almost 6 cm, with mass effect upon the oropharynx and hypopharynx. Abscess within the right submandibular gland extending into the sublingual region. No sign of intracranial extension in the limited intracranial images. No sign of postseptal orbital inflammation on the limited images. Diffuse mucosal inflammation of the right maxillary sinus. No layering fluid. Some dental and periodontal disease, but without advanced periodontal disease/abscess of bone. IMPRESSION: Continued worsening of inflammatory disease of the right face and deep spaces. Most notable changes include enlargement of the right parapharyngeal abscess now measuring up to 3.5 cm in diameter and extending over a length of almost 6 cm with worsened mass effect upon the hypopharynx and pharynx. See above for description of other locations of involvement/extension. Electronically Signed   By: Paulina FusiMark  Shogry M.D.   On: 05/25/2017 09:17    STUDIES:  CT Neck 6/18 > Diffuse facial swelling favored to represent bacterial RIGHT parotitis. Significant facial cellulitis, with RIGHT muscle of mastication enlargement. No visible calculi. No drainable abscess. Minor dental caries and periapical lucencies, not felt to be the source of inflammation. CXR 6/22 > Low lung volumes with right basilar atelectasis. Upper normal heart size likely exaggerated by technique and low lung volumes. Left lung base and costophrenic angle not included in the field of view. Frontal and lateral views may be helpful if patient is able. CT neck 6/22 > Extensive severe multi compartmental infectious/inflammatory process involving the right face from the right periorbital soft tissues to the upper neck. Inflammation involves the all right-sided deep cervical compartments including right parapharyngeal and retropharyngeal  spaces. There multiple ill-defined fluid collections probably representing phlegmon. No discrete rim enhancing abscess is Identified. There is rightward displacement of the airway and moderate narrowing of the airway due to fluid collections in parapharyngeal space and swelling of oral and hypopharyngeal mucosa. The origin of infection is uncertain, possibly odontogenic given the presence of odontogenic disease. CT Neck 6/25 > Continued worsening of inflammatory disease of the right face and deep spaces. Most notable changes include enlargement of the right parapharyngeal abscess now measuring up to 3.5 cm in diameter and extending over a length of almost 6 cm with worsened mass effect upon the hypopharynx and pharynx.  CULTURES: Blood 6/18 > No growth  MRSA PCR 6/18 > Negative  MRSA PCP 6/25 > Negative Staphy aureus 6/25 > Negative  Aerobic/Anareboic Culture 6/25 > Gram Stain 6/25 > GNR  ANTIBIOTICS: Clindamycin 6/18 > 6/23 Unasyn 6/18 > 6/22 Vancomycin 6/23 > Zosyn 6/23 >  SIGNIFICANT EVENTS: 6/18 > Presents to ED 6/25 > Taken to OR for I&D and Trach   LINES/TUBES: Trach 6/25 >>  L femoral CVC 6/25 >>  DISCUSSION: 61 year old male presents to ED on 6/18 with right facial cellulitis/parotitis and parapharyngeal abscess s/p I&D and trach on 6/25   ASSESSMENT / PLAN:  PULMONARY A: Respiratory Insufficieny in setting of facial cellulitis/focal abscess s/p trach  P:   Continue PSV weans as tolerated Lighten sedation D/C solumedrol as sugars are high. He does not need steroids as airway compromise is not a issue  CARDIOVASCULAR A:  HTN Elevated LA P:  Tele monitoring Bolus 500cc NS. Recheck LA  RENAL A:   Hypokalemia. resolved P:   Monitor urine output and cr  GASTROINTESTINAL A:   Hep C genotype 1 with splenomegaly - ? Untreated Protein energy malnutition P:   PPI Continue tube feeds  HEMATOLOGIC A:   Thrombocytopenia > improving  P:  Follow  CBC SQ heparin  INFECTIOUS A:   Right Acute Parotitis with extensive facial cellulitis and focal right parapharyngeal abscess s/p I&D Elevated WBC coung  P:   Trend WBC and Fever Curve  Culture if he spikes Continue Vancomycin and Zosyn. Added clindamycin yesterday Penrose drains (right upper and lower eyelid, right neck, and submental neck) per ENT ID consult Check C diff as he has diarrhea  ENDOCRINE A:   DM   P:   SSI coverage Increase lantus dose to 20 bid for better blood sugar control   NEUROLOGIC A:   Acute Encephalopathy secondary to sedation  P:   RASS goal: 0 Monitor  Wean propofol PRN fentanyl     FAMILY  - Updates: Wife updated daily.   - Inter-disciplinary family meet or Palliative Care meeting due by:  06/18/17  The patient is critically ill with multiple organ system failure and requires high complexity decision making for assessment and support, frequent evaluation and titration of therapies, advanced monitoring, review of radiographic studies and interpretation of complex data.   Critical Care Time devoted to patient care services, exclusive of separately billable procedures, described in this note is 35 minutes.   Chilton Greathouse MD [facility] Pager [phone] If no answer or after 3pm call: [phone] 06/13/2017, 9:07 AM

## 2017-06-13 NOTE — Progress Notes (Signed)
[place] for Infectious Disease    Date of Admission:  06/02/2017   Total days of antibiotics 10        Day 5 vanco        Day 5 piptazo        Day 7 clindamycin   ID: [name] [name] is a 61 y.o. male with right sided facial cellulitis, parotitis which continued to spread causing R-neck and parapharyngeal deep tissue abscess s/p I x D, cultures growing GNR Active Problems:   Acute parotitis   Facial cellulitis   Acute respiratory failure (HCC)    Subjective: Remains sedated and intubated, had high fevers last night at 102.17F but slightly less than the day prior. Having pinkish drainage from penrose onto his dressing. Swelling still marked  Medications:  . chlorhexidine gluconate (MEDLINE KIT)  15 mL Mouth Rinse BID  . feeding supplement (PRO-STAT SUGAR FREE 64)  30 mL Per Tube TID  . fentaNYL (SUBLIMAZE) injection  50 mcg Intravenous Once  . heparin subcutaneous  5,000 Units Subcutaneous Q8H  . insulin aspart  0-20 Units Subcutaneous Q4H  . insulin glargine  20 Units Subcutaneous BID  . mouth rinse  15 mL Mouth Rinse 10 times per day  . mupirocin ointment   Topical BID  . pantoprazole sodium  40 mg Per Tube Daily    Objective: Vital signs in last 24 hours: Temp:  [98 F (36.7 C)-102.4 F (39.1 C)] 98.1 F (36.7 C) (06/27 1213) Pulse Rate:  [55-75] 55 (06/27 1200) Resp:  [10-18] 14 (06/27 1200) BP: (97-135)/(58-73) 108/70 (06/27 1200) SpO2:  [95 %-100 %] 99 % (06/27 1200) FiO2 (%):  [40 %] 40 % (06/27 1200) Weight:  [150 lb 9.2 oz (68.3 kg)] 150 lb 9.2 oz (68.3 kg) (06/27 0500) Physical Exam  Constitutional: He is sedated and intubated. He appears well-developed and well-nourished. No distress.  HENT: facial swelling, asymmetry, lip swelliing, OETT in place. His right side of face is bandaged, with penrose draining Cardiovascular: Normal rate, regular rhythm and normal heart sounds. Exam reveals no gallop and no friction rub.  No murmur heard.  Pulmonary/Chest:  Effort normal and breath sounds normal. No respiratory distress. He has no wheezes.  Abdominal: Soft. Bowel sounds are decreased He exhibits no distension. There is no tenderness.  Skin: Skin is warm and dry. No rash noted. No erythema.   Lab Results  Recent Labs  06/12/17 0458 06/12/17 0604 06/13/17 0415  WBC  --  15.8* 18.7*  HGB  --  7.9* 7.4*  HCT  --  26.6* 26.3*  NA 137  --  137  K 3.8  --  3.9  CL 104  --  103  CO2 26  --  28  BUN 12  --  18  CREATININE 0.95  --  0.92   Liver Panel  Recent Labs  05/20/2017 0148  PROT 6.3*  ALBUMIN 1.8*  AST 57*  ALT 40  ALKPHOS 96  BILITOT 3.0*   Sedimentation Rate No results for input(s): ESRSEDRATE in the last 72 hours. C-Reactive Protein No results for input(s): CRP in the last 72 hours.  Microbiology: 6/25 tissue cx - kleb pneumo = amp R, but otherwise sensitive Studies/Results: Dg Chest Port 1 View  Result Date: 06/13/2017 CLINICAL DATA:  Acute respiratory failure, intubated patient. EXAM: PORTABLE CHEST 1 VIEW COMPARISON:  Portable chest x-ray of June 11, 2017 FINDINGS: The lungs are reasonably well inflated. There is hazy density at both lung bases  which may reflect layering of pleural fluid or subsegmental atelectasis. It is more conspicuous than on study 2 days ago. The heart is normal in size. The pulmonary vascularity is not engorged. There is thoracic aortic calcification. The tracheostomy tube tip lies just inferior to the inferior margin of the clavicular heads. The feeding tube tip lies at the GE junction. IMPRESSION: Hazy density at the lung bases which may reflect layering of pleural fluid or development of subsegmental atelectasis. The tracheostomy tube is in reasonable position. The radiodense tip of the feeding tube lies at the level of the GE junction and advancement by at least 10-20 cm is needed to assure that the tip lies within the stomach. Thoracic aortic atherosclerosis. Electronically Signed   By: David   Martinique M.D.   On: 06/13/2017 07:36   Dg Chest Port 1 View  Result Date: 05/23/2017 CLINICAL DATA:  Status post tracheostomy. EXAM: PORTABLE CHEST 1 VIEW COMPARISON:  Chest x-ray 06/08/2017 FINDINGS: The tip of the tracheostomy tube is 4 cm above the carina. No complicating features. The cardiac silhouette, mediastinal and hilar contours are within normal limits and stable. The lungs are clear of acute process. Possible small pleural effusions. The bony thorax is intact. IMPRESSION: Tracheostomy tube in good position without complicating features. Electronically Signed   By: Marijo Sanes M.D.   On: 06/16/2017 21:41   Dg Abd Portable 1v  Result Date: 06/12/2017 CLINICAL DATA:  NG tube placement EXAM: PORTABLE ABDOMEN - 1 VIEW COMPARISON:  None. FINDINGS: Weighted NG tube tip overlies the gastric body. Nonobstructive bowel gas pattern. Lung bases are clear. IMPRESSION: NG tube tip overlying the gastric body. Electronically Signed   By: Ulyses Jarred M.D.   On: 06/15/2017 21:41     Assessment/Plan: Deep tissue, neck abscess/parapharyngeal abscess= cultures growing kleb pneumo, which appears sensitive to piptazo. Still having high fever which is likely response to infection. Description appears classic for strep/staph though it was not isolated. He was previously on abtx for 7 days prior to going to surgery. For now would continue broad spectrum until start seeing response to therapy  Leukocytosis = remains elevated post surgery but likely contributed by solumedrol which is being tapered. We will continue to monitor cbc with diff  Respiratory insufficiencye = due to anatomical swelling of various OP tissue from infection. Steroids are being tapered, as is sedation to see if can extubated once inflammation is improved.  Diarrhea = primary team is check cdiff.  Baxter Flattery [facility] for Infectious Diseases Cell: [phone] Pager: [phone]  06/13/2017, 2:07 PM

## 2017-06-13 NOTE — Progress Notes (Signed)
MD notified via [facility] Corn[facility] of critical Lactic Acid 2.9. No further orders at this time, will continue to monitor.

## 2017-06-14 ENCOUNTER — Encounter (HOSPITAL_COMMUNITY): Admission: EM | Disposition: E | Payer: Self-pay | Source: Home / Self Care | Attending: Pulmonary Disease

## 2017-06-14 ENCOUNTER — Inpatient Hospital Stay (HOSPITAL_COMMUNITY): Payer: 59

## 2017-06-14 ENCOUNTER — Inpatient Hospital Stay (HOSPITAL_COMMUNITY): Payer: 59 | Admitting: Anesthesiology

## 2017-06-14 HISTORY — PX: INCISION AND DRAINAGE ABSCESS: SHX5864

## 2017-06-14 LAB — POCT I-STAT 3, ART BLOOD GAS (G3+)
ACID-BASE EXCESS: 9 mmol/L — AB (ref 0.0–2.0)
BICARBONATE: 34.7 mmol/L — AB (ref 20.0–28.0)
O2 SAT: 98 %
TCO2: 36 mmol/L (ref 0–100)
pCO2 arterial: 57.4 mmHg — ABNORMAL HIGH (ref 32.0–48.0)
pH, Arterial: 7.392 (ref 7.350–7.450)
pO2, Arterial: 111 mmHg — ABNORMAL HIGH (ref 83.0–108.0)

## 2017-06-14 LAB — GLUCOSE, CAPILLARY
GLUCOSE-CAPILLARY: 137 mg/dL — AB (ref 65–99)
GLUCOSE-CAPILLARY: 66 mg/dL (ref 65–99)
GLUCOSE-CAPILLARY: 131 mg/dL — AB (ref 65–99)
GLUCOSE-CAPILLARY: 129 mg/dL — AB (ref 65–99)
GLUCOSE-CAPILLARY: 118 mg/dL — AB (ref 65–99)
Glucose-Capillary: 137 mg/dL — ABNORMAL HIGH (ref 65–99)
Glucose-Capillary: 170 mg/dL — ABNORMAL HIGH (ref 65–99)
Glucose-Capillary: 51 mg/dL — ABNORMAL LOW (ref 65–99)
Glucose-Capillary: 65 mg/dL (ref 65–99)

## 2017-06-14 LAB — CBC WITH DIFFERENTIAL/PLATELET
BASOS ABS: 0 10*3/uL (ref 0.0–0.1)
Basophils Relative: 0 %
EOS ABS: 0 10*3/uL (ref 0.0–0.7)
Eosinophils Relative: 0 %
HCT: 27.1 % — ABNORMAL LOW (ref 39.0–52.0)
Hemoglobin: 8 g/dL — ABNORMAL LOW (ref 13.0–17.0)
LYMPHS PCT: 7 %
Lymphs Abs: 2.1 10*3/uL (ref 0.7–4.0)
MCH: 23.4 pg — AB (ref 26.0–34.0)
MCHC: 29.5 g/dL — AB (ref 30.0–36.0)
MCV: 79.2 fL (ref 78.0–100.0)
Monocytes Absolute: 1.2 10*3/uL — ABNORMAL HIGH (ref 0.1–1.0)
Monocytes Relative: 4 %
NEUTROS ABS: 26.6 10*3/uL — AB (ref 1.7–7.7)
Neutrophils Relative %: 89 %
PLATELETS: 136 10*3/uL — AB (ref 150–400)
RBC: 3.42 MIL/uL — ABNORMAL LOW (ref 4.22–5.81)
RDW: 15.9 % — AB (ref 11.5–15.5)
WBC: 29.9 10*3/uL — ABNORMAL HIGH (ref 4.0–10.5)

## 2017-06-14 LAB — BASIC METABOLIC PANEL
Anion gap: 3 — ABNORMAL LOW (ref 5–15)
BUN: 13 mg/dL (ref 6–20)
CALCIUM: 7.4 mg/dL — AB (ref 8.9–10.3)
CO2: 32 mmol/L (ref 22–32)
Chloride: 102 mmol/L (ref 101–111)
Creatinine, Ser: 0.71 mg/dL (ref 0.61–1.24)
GFR calc Af Amer: >60 mL/min (ref >60)
Glucose, Bld: 60 mg/dL — ABNORMAL LOW (ref 65–99)
POTASSIUM: 3.7 mmol/L (ref 3.5–5.1)
Sodium: 137 mmol/L (ref 135–145)

## 2017-06-14 LAB — VANCOMYCIN, TROUGH: Vancomycin Tr: 20 ug/mL (ref 15–20)

## 2017-06-14 LAB — TRIGLYCERIDES: TRIGLYCERIDES: 486 mg/dL — AB (ref <150)

## 2017-06-14 SURGERY — INCISION AND DRAINAGE, ABSCESS
Anesthesia: General | Site: Throat

## 2017-06-14 MED ORDER — IOPAMIDOL (ISOVUE-300) INJECTION 61%
INTRAVENOUS | Status: AC
  Filled 2017-06-14: qty 75

## 2017-06-14 MED ORDER — MIDAZOLAM HCL 2 MG/2ML IJ SOLN
INTRAMUSCULAR | Status: AC
  Filled 2017-06-14: qty 2

## 2017-06-14 MED ORDER — MIDAZOLAM HCL 5 MG/5ML IJ SOLN
INTRAMUSCULAR | Status: DC | PRN
  Administered 2017-06-14: 2 mg via INTRAVENOUS

## 2017-06-14 MED ORDER — LIDOCAINE-EPINEPHRINE 1 %-1:100000 IJ SOLN
INTRAMUSCULAR | Status: AC
  Filled 2017-06-14: qty 1

## 2017-06-14 MED ORDER — PHENYLEPHRINE HCL 10 MG/ML IJ SOLN
INTRAMUSCULAR | Status: DC | PRN
  Administered 2017-06-14 (×4): 80 ug via INTRAVENOUS

## 2017-06-14 MED ORDER — CLINDAMYCIN PHOSPHATE 900 MG/50ML IV SOLN
900.0000 mg | Freq: Three times a day (TID) | INTRAVENOUS | Status: DC
  Administered 2017-06-14 – 2017-06-16 (×8): 900 mg via INTRAVENOUS
  Filled 2017-06-14 (×10): qty 50

## 2017-06-14 MED ORDER — SUCCINYLCHOLINE CHLORIDE 20 MG/ML IJ SOLN
INTRAMUSCULAR | Status: DC | PRN
  Administered 2017-06-14: 120 mg via INTRAVENOUS

## 2017-06-14 MED ORDER — DEXTROSE 50 % IV SOLN
INTRAVENOUS | Status: AC
  Administered 2017-06-14: 50 mL
  Filled 2017-06-14: qty 50

## 2017-06-14 MED ORDER — PROPOFOL 10 MG/ML IV BOLUS
INTRAVENOUS | Status: AC
  Filled 2017-06-14: qty 20

## 2017-06-14 MED ORDER — FENTANYL CITRATE (PF) 250 MCG/5ML IJ SOLN
INTRAMUSCULAR | Status: AC
  Filled 2017-06-14: qty 5

## 2017-06-14 MED ORDER — INSULIN ASPART 100 UNIT/ML ~~LOC~~ SOLN
0.0000 [IU] | SUBCUTANEOUS | Status: DC
  Administered 2017-06-14 (×2): 1 [IU] via SUBCUTANEOUS
  Administered 2017-06-15: 7 [IU] via SUBCUTANEOUS
  Administered 2017-06-15: 5 [IU] via SUBCUTANEOUS
  Administered 2017-06-15: 2 [IU] via SUBCUTANEOUS
  Administered 2017-06-15 (×2): 7 [IU] via SUBCUTANEOUS
  Administered 2017-06-15 – 2017-06-16 (×2): 5 [IU] via SUBCUTANEOUS

## 2017-06-14 MED ORDER — LACTATED RINGERS IV SOLN
INTRAVENOUS | Status: DC | PRN
  Administered 2017-06-14: 21:00:00 via INTRAVENOUS

## 2017-06-14 MED ORDER — DEXTROSE 50 % IV SOLN: 1.0000 | Freq: Once | INTRAVENOUS | Status: AC

## 2017-06-14 MED ORDER — ROCURONIUM BROMIDE 100 MG/10ML IV SOLN
INTRAVENOUS | Status: DC | PRN
  Administered 2017-06-14: 30 mg via INTRAVENOUS

## 2017-06-14 MED ORDER — LIDOCAINE-EPINEPHRINE 1 %-1:100000 IJ SOLN
INTRAMUSCULAR | Status: DC | PRN
  Administered 2017-06-14: 1 mL

## 2017-06-14 MED ORDER — IOPAMIDOL (ISOVUE-300) INJECTION 61%
75.0000 mL | Freq: Once | INTRAVENOUS | Status: AC | PRN
  Administered 2017-06-14: 75 mL via INTRAVENOUS

## 2017-06-14 MED ORDER — ACETAMINOPHEN 650 MG RE SUPP
650.0000 mg | Freq: Four times a day (QID) | RECTAL | Status: DC | PRN
Start: 2017-06-14 — End: 2017-06-21
  Administered 2017-06-14: 650 mg via RECTAL
  Filled 2017-06-14: qty 1

## 2017-06-14 MED ORDER — DEXTROSE 5 % IV SOLN
INTRAVENOUS | Status: DC
  Administered 2017-06-14 (×2): via INTRAVENOUS

## 2017-06-14 MED ORDER — SODIUM CHLORIDE 0.9 % IR SOLN
Status: DC | PRN
  Administered 2017-06-14: 1000 mL

## 2017-06-14 SURGICAL SUPPLY — 43 items
BLADE SURG 15 STRL LF DISP TIS (BLADE) IMPLANT
BLADE SURG 15 STRL SS (BLADE)
BNDG CONFORM 2 STRL LF (GAUZE/BANDAGES/DRESSINGS) ×3 IMPLANT
BNDG GAUZE ELAST 4 BULKY (GAUZE/BANDAGES/DRESSINGS) ×3 IMPLANT
CATH ROBINSON RED A/P 12FR (CATHETERS) ×3 IMPLANT
COVER SURGICAL LIGHT HANDLE (MISCELLANEOUS) ×6 IMPLANT
CRADLE DONUT ADULT HEAD (MISCELLANEOUS) IMPLANT
DRAIN PENROSE 1/2X12 LTX STRL (WOUND CARE) ×3 IMPLANT
DRAIN PENROSE 1/4X12 LTX STRL (WOUND CARE) ×3 IMPLANT
DRAPE HALF SHEET 40X57 (DRAPES) IMPLANT
DRAPE ORTHO SPLIT 77X108 STRL (DRAPES) ×2
DRAPE SURG ORHT 6 SPLT 77X108 (DRAPES) ×1 IMPLANT
DRSG PAD ABDOMINAL 8X10 ST (GAUZE/BANDAGES/DRESSINGS) IMPLANT
ELECT COATED BLADE 2.86 ST (ELECTRODE) ×3 IMPLANT
ELECT REM PT RETURN 9FT ADLT (ELECTROSURGICAL) ×3
ELECTRODE REM PT RTRN 9FT ADLT (ELECTROSURGICAL) ×1 IMPLANT
GAUZE SPONGE 4X4 12PLY STRL (GAUZE/BANDAGES/DRESSINGS) IMPLANT
GAUZE SPONGE 4X4 12PLY STRL LF (GAUZE/BANDAGES/DRESSINGS) ×3 IMPLANT
GAUZE SPONGE 4X4 16PLY XRAY LF (GAUZE/BANDAGES/DRESSINGS) ×3 IMPLANT
GLOVE BIO SURGEON STRL SZ7.5 (GLOVE) ×3 IMPLANT
GOWN STRL REUS W/ TWL LRG LVL3 (GOWN DISPOSABLE) ×1 IMPLANT
GOWN STRL REUS W/TWL LRG LVL3 (GOWN DISPOSABLE) ×2
KIT BASIN OR (CUSTOM PROCEDURE TRAY) ×3 IMPLANT
KIT ROOM TURNOVER OR (KITS) ×3 IMPLANT
MARKER SKIN DUAL TIP RULER LAB (MISCELLANEOUS) ×3 IMPLANT
NEEDLE HYPO 25GX1X1/2 BEV (NEEDLE) ×3 IMPLANT
NS IRRIG 1000ML POUR BTL (IV SOLUTION) ×3 IMPLANT
PACK SURGICAL SETUP 50X90 (CUSTOM PROCEDURE TRAY) ×3 IMPLANT
PAD ABD 8X10 STRL (GAUZE/BANDAGES/DRESSINGS) ×3 IMPLANT
PAD ARMBOARD 7.5X6 YLW CONV (MISCELLANEOUS) ×6 IMPLANT
PENCIL BUTTON HOLSTER BLD 10FT (ELECTRODE) ×3 IMPLANT
RUBBERBAND STERILE (MISCELLANEOUS) IMPLANT
SPONGE LAP 4X18 X RAY DECT (DISPOSABLE) ×6 IMPLANT
SUT ETHILON 2 0 FS 18 (SUTURE) ×3 IMPLANT
SUT ETHILON 3 0 FSL (SUTURE) ×3 IMPLANT
SUT SILK 2 0 SH CR/8 (SUTURE) IMPLANT
SWAB COLLECTION DEVICE MRSA (MISCELLANEOUS) ×3 IMPLANT
SWAB CULTURE ESWAB REG 1ML (MISCELLANEOUS) ×3 IMPLANT
SYR BULB IRRIGATION 50ML (SYRINGE) ×3 IMPLANT
SYR CONTROL 10ML LL (SYRINGE) IMPLANT
TUBE CONNECTING 12'X1/4 (SUCTIONS) ×1
TUBE CONNECTING 12X1/4 (SUCTIONS) ×2 IMPLANT
YANKAUER SUCT BULB TIP NO VENT (SUCTIONS) ×3 IMPLANT

## 2017-06-14 NOTE — Progress Notes (Signed)
   Subjective:    Patient ID: [name] [name], male    DOB: [birth year], 61 y.o.   MRN: [id number]  HPI Continues sedated on ventilator.  Feeding tube not in good position, cannot be advanced.  Review of Systems     Objective:   Physical Exam AF VSS Sedated.  Mechanical ventilator Trach site stable with balloon inflated. Changed dressings.  Mouth sites repacked.  Milked Penrose sites, not much expressed from eyelids, neck and cheek still with pink pus expressed.  Lower eyelid skin breakdown.    Assessment & Plan:  Right parotid, neck, parapharyngeal, and eyelid abscess s/p I&Ds and trach.  No fever in last 24 hours.  Continue drains and packing.  ID assisting.  Culture growing Klebsiella.  Recommend follow-up maxillofacial CT with contrast.  CCM to replace feeding tube.

## 2017-06-14 NOTE — Progress Notes (Signed)
Cortrack unavailable today.  Dr. Isaiah SergeMannam notified and received verbal orders to not place NG tube due to patient's facial trauma, and await Cortrak placement tomorrow (6/29).  Will DC current coiled feedingtube. [place], Mitzi HansenJessica Marie

## 2017-06-14 NOTE — Progress Notes (Signed)
PULMONARY / CRITICAL CARE MEDICINE   Name: [name] [name] MRN: [id number] DOB: 04/07/1956    ADMISSION DATE:  05/29/2017 CONSULTATION DATE:  05/28/2017  REFERRING MD:  Dr. Sharon Seller  CHIEF COMPLAINT:  Right parapharyngeal and neck abscesses  HISTORY OF PRESENT ILLNESS:  Patient is sedated and on mechanical ventilation and therefore HPI obtained from chart review.   61 year old male with PMH as below, which is significant for DM, HTN, and Hepatitis C admitted on 6/18 with right parotitis and facial swelling.  Patient can speak some conversational English, otherwise speaks "Proofreader" language.  Patient was in his usual state of health until 6/15 when he developed right sided facial swelling with progressive fever and chills.  He was seen at urgent care 6/17 and started on clindamycin however was unable to continue due to progressive dysphagia.   CT showed right parotitis but no drainable abscess.  He was then admitted to the ICU under PCCM care for airway monitoring and continued on Clindamycin and Unasyn.  ENT was consulted with recommendations for continued antibiotic coverage.  Not a surgical candidate due to no drainable abscess. Patient was transferred to [facility] service for further monitoring on 6/20.  Repeat CT imaging on 6/22 with ill defined fluid filled collections but no discrete rim enhancing abscess.  Vancomycin was added 6/23.  Repeat CT on 6/25 showed a 3.5 cm right parapharyngeal abscess and possible fluid collection sin the right eyelid and parotid space.  Therefore, he was taken to the OR on 6/25 by ENT for I&D with placement of penrose drains to the upper and lower eyelids, right neck and submental neck.  A tracheostomy placement was performed for airway protection.  Patient to remained sedated overnight and transferred to ICU on mechanical ventilation.  PCCM to resume primary care.      SUBJECTIVE:  Stable overnight. Maintained on vent. FT needs to be advanced   VITAL SIGNS: BP 131/63    Pulse 67   Temp 99.8 F (37.7 C) (Oral)   Resp 15   Ht 5' (1.524 m)   Wt 150 lb 2.1 oz (68.1 kg)   SpO2 97%   BMI 29.32 kg/m   HEMODYNAMICS:    VENTILATOR SETTINGS: Vent Mode: PRVC FiO2 (%):  [40 %] 40 % Set Rate:  [16 bmp] 16 bmp Vt Set:  [400 mL] 400 mL PEEP:  [5 cmH20] 5 cmH20 Plateau Pressure:  [9 cmH20-16 cmH20] 16 cmH20  INTAKE / OUTPUT: I/O last 3 completed shifts: In: 5478.5 [I.V.:2613.5; Other:30; GY/IR:4854; IV Piggyback:1500] Out: 2835 [Urine:2835]  PHYSICAL EXAMINATION: General:  WDWD male sedated on vent HEENT:Rt facial drains/dressings and teach in palce OEV:[id number] Neuro: sedated CV: s1s2 rrr, no m/r/g PULM: Decreased bs bases GH:WEXH, non-tender, bsx4 active , FT needs advancement Extremities: warm/dry, + edema  Skin: no rashes or lesions   LABS:  BMET  Recent Labs Lab 05/20/2017 0148 06/12/17 0458 06/13/17 0415  NA 139 137 137  K 3.7 3.8 3.9  CL 108 104 103  CO2 24 26 28   BUN 15 12 18   CREATININE 0.75 0.95 0.92  GLUCOSE 253* 128* 252*    Electrolytes  Recent Labs Lab 06/09/17 0405  06/15/2017 0148 06/12/17 0458 06/13/17 0415  CALCIUM 7.5*  < > 7.9* 7.6* 7.5*  MG 1.9  --   --  1.8 1.9  PHOS  --   --   --  2.4* 3.6  < > = values in this interval not displayed.  CBC  Recent Labs Lab  June 11, 2017 0148 06/12/17 0604 06/13/17 0415  WBC 14.3* 15.8* 18.7*  HGB 9.2* 7.9* 7.4*  HCT 30.2* 26.6* 26.3*  PLT 161 125* 118*    Coag's No results for input(s): APTT, INR in the last 168 hours.  Sepsis Markers  Recent Labs Lab 06/12/17 2324 06/13/17 1230  LATICACIDVEN 2.9* 2.2*    ABG  Recent Labs Lab 06/12/17 0500  PHART 7.482*  PCO2ART 35.9  PO2ART 111*    Liver Enzymes  Recent Labs Lab 06/08/17 0712 06/09/17 0405 June 11, 2017 0148  AST 254* 112* 57*  ALT 81* 58 40  ALKPHOS 60 50 96  BILITOT 4.7* 5.0* 3.0*  ALBUMIN 2.1* 1.8* 1.8*    Cardiac Enzymes  Recent Labs Lab 06/08/17 0248 06/08/17 0712 06/08/17 1205   TROPONINI 0.03* 0.06* <0.03    Glucose  Recent Labs Lab 06/13/17 1211 06/13/17 1623 06/13/17 1931 06/13/17 2346 06/07/2017 0322 05/26/2017 0746  GLUCAP 223* 226* 233* 170* 137* 66   Imaging Ct Soft Tissue Neck W Contrast  Result Date: 07/23/2017 CLINICAL DATA:  Followup parotiditis. EXAM: CT NECK WITH CONTRAST TECHNIQUE: Multidetector CT imaging of the neck was performed using the standard protocol following the bolus administration of intravenous contrast. CONTRAST:  75 cc Isovue-300 COMPARISON:  06/08/2017, 05/19/2017 FINDINGS: There is further worsening by imaging. Patient appears to have diffuse suppurative inflammation of the right parotid and submandibular glands. Diffuse cellulitis/abscess formation throughout the right face and deep spaces with specific findings as below. Low-density fluid, presumably abscess, superficial to the right parotid gland with thickness measuring up to 16 mm. Low-density fluid, presumably abscess, right periorbital preseptal region measuring up to 2 cm in thickness. More superficial subcutaneous fluid in the right cheek measuring up to 17 mm in thickness. Right parapharyngeal space abscess much larger, measuring 3.5 x 2.7 cm and extending over a length of almost 6 cm, with mass effect upon the oropharynx and hypopharynx. Abscess within the right submandibular gland extending into the sublingual region. No sign of intracranial extension in the limited intracranial images. No sign of postseptal orbital inflammation on the limited images. Diffuse mucosal inflammation of the right maxillary sinus. No layering fluid. Some dental and periodontal disease, but without advanced periodontal disease/abscess of bone. IMPRESSION: Continued worsening of inflammatory disease of the right face and deep spaces. Most notable changes include enlargement of the right parapharyngeal abscess now measuring up to 3.5 cm in diameter and extending over a length of almost 6 cm with worsened  mass effect upon the hypopharynx and pharynx. See above for description of other locations of involvement/extension. Electronically Signed   By: Paulina FusiMark  Shogry M.D.   On: 008/05/2017 09:17    STUDIES:  CT Neck 6/18 > Diffuse facial swelling favored to represent bacterial RIGHT parotitis. Significant facial cellulitis, with RIGHT muscle of mastication enlargement. No visible calculi. No drainable abscess. Minor dental caries and periapical lucencies, not felt to be the source of inflammation. CXR 6/22 > Low lung volumes with right basilar atelectasis. Upper normal heart size likely exaggerated by technique and low lung volumes. Left lung base and costophrenic angle not included in the field of view. Frontal and lateral views may be helpful if patient is able. CT neck 6/22 > Extensive severe multi compartmental infectious/inflammatory process involving the right face from the right periorbital soft tissues to the upper neck. Inflammation involves the all right-sided deep cervical compartments including right parapharyngeal and retropharyngeal spaces. There multiple ill-defined fluid collections probably representing phlegmon. No discrete rim enhancing abscess is Identified.  There is rightward displacement of the airway and moderate narrowing of the airway due to fluid collections in parapharyngeal space and swelling of oral and hypopharyngeal mucosa. The origin of infection is uncertain, possibly odontogenic given the presence of odontogenic disease. CT Neck 6/25 > Continued worsening of inflammatory disease of the right face and deep spaces. Most notable changes include enlargement of the right parapharyngeal abscess now measuring up to 3.5 cm in diameter and extending over a length of almost 6 cm with worsened mass effect upon the hypopharynx and pharynx.  6/27 CxR with ft needing advancement. 6/28 CxR Pending>>  CULTURES: Blood 6/18 > No growth  MRSA PCR 6/18 > Negative  MRSA PCP 6/25 >  Negative Staphy aureus 6/25 > Negative  Aerobic/Anareboic Culture 6/25 >> Kleb P. Res to ampicillin  Gram Stain 6/25 > GNR  ANTIBIOTICS: Per ID Clindamycin 6/18 > 6/23 Unasyn 6/18 > 6/22 Vancomycin 6/23 > Zosyn 6/23 >  SIGNIFICANT EVENTS: 6/18 > Presents to ED 6/25 > Taken to OR for I&D and Trach   LINES/TUBES: Trach 6/25 >>  L femoral CVC 6/25 >>  DISCUSSION: 61 year old male presents to ED on 6/18 with right facial cellulitis/parotitis and parapharyngeal abscess s/p I&D and trach on 6/25. 6/28 lighten sedation and wean from vent as tolerated. Lantus stopped and D5w started as FT malpositioned and tube feeds stopped coupled with stopping steroids has led to hypoglycemia.  ASSESSMENT / PLAN:  PULMONARY A: Respiratory Insufficieny in setting of facial cellulitis/focal abscess s/p trach  P:   Continue PSV weans as tolerated Lighten sedation D/C solumedrol 6/27 as sugars are high. He does not need steroids as airway compromise is not a issue  CARDIOVASCULAR A:  HTN Elevated LA P:  Tele monitoring Bolus 500cc NS. Recheck LA  RENAL Lab Results  Component Value Date   CREATININE 0.92 06/13/2017   CREATININE 0.95 06/12/2017   CREATININE 0.75 July 03, 2017   CREATININE 0.83 04/05/2015   CREATININE 0.76 07/28/2014   CREATININE 0.80 02/23/2014    Recent Labs Lab 07-03-17 0148 06/12/17 0458 06/13/17 0415  K 3.7 3.8 3.9     A:   Hypokalemia. resolved P:   Monitor urine output and cr  GASTROINTESTINAL A:   Hep C genotype 1 with splenomegaly - ? Untreated Protein energy malnutition P:   PPI Continue tube feeds Needs FT advanced. Will check  X ray  then advance as needed  HEMATOLOGIC A:   Thrombocytopenia > improving  P:  Follow CBC SQ heparin  INFECTIOUS A:   Right Acute Parotitis with extensive facial cellulitis and focal right parapharyngeal abscess s/p I&D with Klebsiella P. Res to ampicillin Elevated WBC coung  P:   Trend WBC and  Fever Curve  Culture if he spikes Continue Vancomycin and Zosyn, clindamycin Penrose drains (right upper and lower eyelid, right neck, and submental neck) per ENT ID consult appreciated   C diff neg 6/27  ENDOCRINE CBG (last 3)   Recent Labs  06/13/17 2346 06/06/2017 0322 06/02/2017 0746  GLUCAP 170* 137* 66     A:   DM   P:   SSI coverage Increase lantus dose to 20 bid for better blood sugar control ,  \6/28 Tube feed on hold, monitor for hypoglycemia ,6/28  lantus stopped and d5w till tube feeds restarted  NEUROLOGIC A:   Acute Encephalopathy secondary to sedation  P:   RASS goal: 0 Monitor  Wean propofol PRN fentanyl   Daily wake up assessments  FAMILY  - Updates: Wife updated daily not sure how much english she inderstands - Inter-disciplinary family meet or Palliative Care meeting due by:  06/18/17  App CCT 30 min   Brett Canales Minor ACNP Adolph Pollack PCCM Pager [phone] till 3 pm If no answer page [phone] 05/19/2017, 8:22 AM

## 2017-06-14 NOTE — Progress Notes (Addendum)
1200 CBG 55, 1 amp d50 given. MD notified and new orders received to increase D5 gtts to 75 ml/hr. Wilfred CurtisMil[place], Garry Bochicchio Marie  1400: re-check CBG 131

## 2017-06-14 NOTE — Transfer of Care (Signed)
Immediate Anesthesia Transfer of Care Note  Patient: Nashon Hineman  Procedure(s) Performed: Procedure(s): Debridement of Right NECK and FACIAL Abscess, Incision and Drainage Right Temporal Abscess, and Drainage of right PARAPHARYNGEAL  ABSCESS (N/A)  Patient Location: ICU  Anesthesia Type:General  Level of Consciousness: sedated and unresponsive  Airway & Oxygen Therapy: Patient placed on Ventilator (see vital sign flow sheet for setting)  Post-op Assessment: Report given to RN and Post -op Vital signs reviewed and stable  Post vital signs: Reviewed and stable  Last Vitals:  Vitals:   06/05/2017 1926 05/21/2017 2000  BP:  117/65  Pulse: (!) 59 62  Resp: 16 16  Temp:      Last Pain:  Vitals:   05/20/2017 1900  TempSrc: Rectal  PainSc:       Patients Stated Pain Goal: 3 (06/08/17 0758)  Complications: No apparent anesthesia complications

## 2017-06-14 NOTE — Progress Notes (Signed)
Inpatient Diabetes Program Recommendations  AACE/ADA: New Consensus Statement on Inpatient Glycemic Control (2015)  Target Ranges:  Prepandial:   less than 140 mg/dL      Peak postprandial:   less than 180 mg/dL (1-2 hours)      Critically ill patients:  140 - 180 mg/dL   Review of Glycemic Control  Diabetes history:Type 2 diabetes Outpatient Diabetes medications: Metformin 500 mg bid, Glucotrol 5 mg daily Current orders for Inpatient glycemic control:  Novolog Resistant 0-20 units q 4 hours  Inpatient Diabetes Program Recommendations:   Patient no longer on steroids, consider decreasing Novolog Correction scale to Novolog Sensitive 0-9 units Q4hours. When tube feeds start back may want to consider Tube Feed Coverage.  Thanks,  Christena DeemShannon Trenisha Lafavor RN, MSN, [facility] Inpatient Diabetes Coordinator Team Pager [phone] (8a-5p)

## 2017-06-14 NOTE — Anesthesia Preprocedure Evaluation (Signed)
Anesthesia Evaluation  Patient identified by MRN, date of birth, ID band Patient unresponsive    Reviewed: Unable to perform ROS - Chart review only  Airway Mallampati: Intubated       Dental   Pulmonary former smoker,    Pulmonary exam normal        Cardiovascular hypertension, Pt. on medications Normal cardiovascular exam     Neuro/Psych    GI/Hepatic (+) Hepatitis -, C  Endo/Other  diabetes, Type 2, Insulin Dependent  Renal/GU      Musculoskeletal   Abdominal   Peds  Hematology   Anesthesia Other Findings   Reproductive/Obstetrics                             Anesthesia Physical Anesthesia Plan  ASA: III and emergent  Anesthesia Plan: General   Post-op Pain Management:    Induction: Intravenous  PONV Risk Score and Plan: 2 and Ondansetron, Dexamethasone and Treatment may vary due to age or medical condition  Airway Management Planned: Oral ETT  Additional Equipment:   Intra-op Plan:   Post-operative Plan: Post-operative intubation/ventilation  Informed Consent: I have reviewed the patients History and Physical, chart, labs and discussed the procedure including the risks, benefits and alternatives for the proposed anesthesia with the patient or authorized representative who has indicated his/her understanding and acceptance.     Plan Discussed with: CRNA and Surgeon  Anesthesia Plan Comments:         Anesthesia Quick Evaluation

## 2017-06-14 NOTE — Progress Notes (Signed)
AM CBG 66, MD notified and new orders received. [place], Mitzi HansenJessica Marie

## 2017-06-14 NOTE — Brief Op Note (Signed)
05/24/2017 - 06/14/2017  10:33 PM  PATIENT:  [name] [name]  61 y.o. male  PRE-OPERATIVE DIAGNOSIS:  abscess  POST-OPERATIVE DIAGNOSIS:  abscess  PROCEDURE:  Procedure(s): Debridement of Right NECK and FACIAL Abscess, Incision and Drainage Right Temporal Abscess, and Drainage of right PARAPHARYNGEAL  ABSCESS (N/A)  SURGEON:  Surgeon(s) and Role:    Christia Reading* Schylar Allard, MD - Primary  PHYSICIAN ASSISTANT:   ASSISTANTS: none   ANESTHESIA:   general  EBL:  Total I/O In: 97.2 [I.V.:97.2] Out: 700 [Urine:700]  BLOOD ADMINISTERED:none  DRAINS: none   LOCAL MEDICATIONS USED:  LIDOCAINE   SPECIMEN:  No Specimen  DISPOSITION OF SPECIMEN:  N/A  COUNTS:  YES  TOURNIQUET:  * No tourniquets in log *  DICTATION: .Other Dictation: Dictation Number [id number]  PLAN OF CARE: Return to ICU  PATIENT DISPOSITION:  ICU - intubated and hemodynamically stable.   Delay start of Pharmacological VTE agent (>24hrs) due to surgical blood loss or risk of bleeding: no

## 2017-06-14 NOTE — Progress Notes (Addendum)
[facility] for Infectious Disease    Date of Admission:  05/21/2017   Total days of antibiotics 11        Day 6 vanco        Day 6 piptazo        Day 8 clindamycin   ID: [name] [name] is a 61 y.o. male with right sided facial cellulitis, parotitis which continued to spread causing R-neck and parapharyngeal deep tissue abscess s/p I x D, cultures growing GNR Active Problems:   Acute parotitis   Facial cellulitis   Acute respiratory failure (HCC)    Subjective: Afebrile x 24hrs  Medications:  . chlorhexidine gluconate (MEDLINE KIT)  15 mL Mouth Rinse BID  . Chlorhexidine Gluconate Cloth  6 each Topical Daily  . feeding supplement (PRO-STAT SUGAR FREE 64)  30 mL Per Tube TID  . fentaNYL (SUBLIMAZE) injection  50 mcg Intravenous Once  . heparin subcutaneous  5,000 Units Subcutaneous Q8H  . insulin aspart  0-20 Units Subcutaneous Q4H  . mouth rinse  15 mL Mouth Rinse 10 times per day  . mupirocin ointment   Topical BID  . pantoprazole sodium  40 mg Per Tube Daily  . sodium chloride flush  10-40 mL Intracatheter Q12H    Objective: Vital signs in last 24 hours: Temp:  [98 F (36.7 C)-99.8 F (37.7 C)] 99.8 F (37.7 C) (06/28 0757) Pulse Rate:  [55-69] 67 (06/28 0600) Resp:  [9-19] 15 (06/28 0600) BP: (101-131)/(58-71) 131/63 (06/28 0600) SpO2:  [97 %-100 %] 97 % (06/28 0600) FiO2 (%):  [40 %] 40 % (06/28 0400) Weight:  [150 lb 2.1 oz (68.1 kg)] 150 lb 2.1 oz (68.1 kg) (06/28 0342) Physical Exam  Constitutional: He is sedated and intubated. He appears well-developed and well-nourished. No distress.  HENT: facial swelling, asymmetry, lip swelliing, OETT in place. His right side of face is bandaged, with penrose draining Cardiovascular: Normal rate, regular rhythm and normal heart sounds. Exam reveals no gallop and no friction rub.  No murmur heard.  Pulmonary/Chest: Effort normal and breath sounds normal. No respiratory distress. He has no wheezes.  Abdominal: Soft. Bowel  sounds are decreased He exhibits no distension. There is no tenderness.  Skin: Skin is warm and dry. No rash noted. No erythema.   Lab Results  Recent Labs  06/12/17 0458 06/12/17 0604 06/13/17 0415  WBC  --  15.8* 18.7*  HGB  --  7.9* 7.4*  HCT  --  26.6* 26.3*  NA 137  --  137  K 3.8  --  3.9  CL 104  --  103  CO2 26  --  28  BUN 12  --  18  CREATININE 0.95  --  0.92   Liver Panel No results for input(s): PROT, ALBUMIN, AST, ALT, ALKPHOS, BILITOT, BILIDIR, IBILI in the last 72 hours. Sedimentation Rate No results for input(s): ESRSEDRATE in the last 72 hours. C-Reactive Protein No results for input(s): CRP in the last 72 hours.  Microbiology: 6/25 tissue cx - kleb pneumo = amp R, but otherwise sensitive Studies/Results: Dg Chest Port 1 View  Result Date: 05/20/2017 CLINICAL DATA:  The tori failure EXAM: PORTABLE CHEST 1 VIEW COMPARISON:  Portable exam 0840 hours compared to 06/13/2017 FINDINGS: Tracheostomy tube projects over tracheal air column. RIGHT arm PICC line tip projects over SVC above cavoatrial junction. Feeding tube is now coiled in the cervical esophagus with tip projecting over distal esophagus ; recommend withdrawal and replacement. Borderline enlargement  of cardiac silhouette. Mediastinal contours and pulmonary vascularity normal. Minimal bibasilar atelectasis with improved aeration in LEFT lower lobe since previous exam. Upper lungs clear. No pleural effusion or pneumothorax. IMPRESSION: Improved bibasilar aeration with mild residual bibasilar atelectasis. Feeding tube coiled in cervical esophagus with tip projecting over the distal esophagus ; recommend withdrawal and replacement. Findings called to [facility] on [place] on 05/26/2017 at 0855 hrs. Electronically Signed   By: Lavonia Dana M.D.   On: 05/20/2017 08:58   Dg Chest Port 1 View  Result Date: 06/13/2017 CLINICAL DATA:  Acute respiratory failure, intubated patient. EXAM: PORTABLE CHEST 1 VIEW COMPARISON:   Portable chest x-ray of June 11, 2017 FINDINGS: The lungs are reasonably well inflated. There is hazy density at both lung bases which may reflect layering of pleural fluid or subsegmental atelectasis. It is more conspicuous than on study 2 days ago. The heart is normal in size. The pulmonary vascularity is not engorged. There is thoracic aortic calcification. The tracheostomy tube tip lies just inferior to the inferior margin of the clavicular heads. The feeding tube tip lies at the GE junction. IMPRESSION: Hazy density at the lung bases which may reflect layering of pleural fluid or development of subsegmental atelectasis. The tracheostomy tube is in reasonable position. The radiodense tip of the feeding tube lies at the level of the GE junction and advancement by at least 10-20 cm is needed to assure that the tip lies within the stomach. Thoracic aortic atherosclerosis. Electronically Signed   By: David  Martinique M.D.   On: 06/13/2017 07:36   Dg Abd Portable 1v  Result Date: 06/13/2017 CLINICAL DATA:  NG tube placement EXAM: PORTABLE ABDOMEN - 1 VIEW COMPARISON:  05/25/2017 FINDINGS: Weighted tip enteric tube overlies the expected location the distal esophagus. Paucity of bowel gas without evidence of enteric obstruction. No supine evidence of pneumoperitoneum. No definite pneumatosis or portal venous gas Limited visualization of lower thorax is normal. Left femoral central venous catheter overlies the superior aspect the left sacral ala. No acute osseus abnormalities. IMPRESSION: 1. Enteric tube tip overlies the distal esophagus. Advancement at least 10 cm is recommended. 2. Paucity of bowel gas without evidence of enteric obstruction. Electronically Signed   By: Sandi Mariscal M.D.   On: 06/13/2017 21:32     Assessment/Plan: Deep tissue, neck abscess/parapharyngeal abscess s/p debridement= cultures growing kleb pneumo, which appears sensitive to piptazo.  - continue on amp/sub and continue on  clindamycin - will d/c vanco - recommend to repeat imaging to see if further debridement is necessary. I feel that his leukocytosis is indication that we do not have source control   Leukocytosis = remains elevated post surgery but likely contributed by solumedrol which is being tapered. We will continue to monitor cbc with diff  Respiratory insufficiency = due to anatomical swelling of various OP tissue from infection. Steroids are being tapered, as is sedation to see if can extubated once inflammation is improved.  Diarrhea = abtx associated diarrhea cdiff is negative  Addendum - had isolated spike of 103F plus leukocytosis is worsening. Recommend scanning CT head and neck  Ramiel Forti, [facility] for Infectious Diseases Cell: [phone] Pager: [phone]  05/25/2017, 9:33 AM

## 2017-06-14 NOTE — Progress Notes (Signed)
Pharmacy Antibiotic Note  [name] [name] is a 61 y.o. male continues on vancomycin, clindamycin and Unasyn for bacterial parotitis, facial cellulitis, and neck abscess. Vancomycin trough drawn today is at goal =20 (goal 15-20). Renal function stable.  Plan: -Per Dr. Drue SecondSnider will discontinue vancomycin and clindamycin -Continue Unasyn 3g q 6 hrs -Will follow for length of antibiotic therapy.  Height: 5' (152.4 cm) Weight: 150 lb 2.1 oz (68.1 kg) IBW/kg (Calculated) : 50  Temp (24hrs), Avg:98.7 F (37.1 C), Min:98 F (36.7 C), Max:99.8 F (37.7 C)   Recent Labs Lab 06/10/17 0318 06/13/2017 0148 06/12/17 0458 06/12/17 0604 06/12/17 0940 06/12/17 2324 06/13/17 0415 06/13/17 1230 04-28-17 0914 04-28-17 0930  WBC 17.0* 14.3*  --  15.8*  --   --  18.7*  --  29.9*  --   CREATININE 0.73 0.75 0.95  --   --   --  0.92  --  0.71  --   LATICACIDVEN  --   --   --   --   --  2.9*  --  2.2*  --   --   VANCOTROUGH  --   --   --   --  10*  --   --   --   --  20    Estimated Creatinine Clearance: 80.4 mL/min (by C-G formula based on SCr of 0.71 mg/dL).    No Known Allergies  Antimicrobials this admission:  6/18 Unasyn >6/23 6/18 Clinda >6/23; Resume 6/26 >6/28 6/23 Vanc>6/28 *6/26 VT=10 on 750mg  q12 > inc to 1g q8 (target closer to goal of 15) 6/23 Zosyn>>6/27 6/27 Unasyn>>  Dose adjustments this admission:  6/26 VT = 10 on vancomycin 750 mg q 12 hr, inc to q 8 hrs 6/28 VT = 20 on vancomycin 750 mg q 8 hrs > then vanc d/c'd  Microbiology results:  6/18 blood cx: neg 6/18 HIV neg 6/18 MRSA neg 6/25 neck abscess > k. Pneumoniae - sens to all abx except amp 6/27 C. Diff neg  Thank you for allowing pharmacy to be a part of this patient's care.  Tad MooreJessica Princessa Lesmeister, Pharm D, BCPS  Clinical Pharmacist Pager [phone]  05-11-2017 10:53 AM

## 2017-06-14 NOTE — Progress Notes (Signed)
eLink Physician-Brief Progress Note Patient Name: [name] FranklinHlec Finazzo DOB: 02-Jan-1956 MRN: [id number]   Date of Service  05/18/2017  HPI/Events of Note  Patient previously hypoglycemic. Now on dextrose. Currently ordered resistant SSI.  eICU Interventions  Switching to sensitive SSI     Intervention Category Major Interventions: Hyperglycemia - active titration of insulin therapy  [name] [name] 05/29/2017, 4:10 PM

## 2017-06-15 ENCOUNTER — Inpatient Hospital Stay (HOSPITAL_COMMUNITY): Payer: 59

## 2017-06-15 ENCOUNTER — Encounter (HOSPITAL_COMMUNITY): Payer: Self-pay | Admitting: Otolaryngology

## 2017-06-15 DIAGNOSIS — R079 Chest pain, unspecified: Secondary | ICD-10-CM

## 2017-06-15 DIAGNOSIS — D500 Iron deficiency anemia secondary to blood loss (chronic): Secondary | ICD-10-CM

## 2017-06-15 LAB — CBC
HEMATOCRIT: 26.3 % — AB (ref 39.0–52.0)
HEMATOCRIT: 19.2 % — AB (ref 39.0–52.0)
Hemoglobin: 7.5 g/dL — ABNORMAL LOW (ref 13.0–17.0)
Hemoglobin: 5.5 g/dL — CL (ref 13.0–17.0)
MCH: 22.5 pg — ABNORMAL LOW (ref 26.0–34.0)
MCH: 22.9 pg — ABNORMAL LOW (ref 26.0–34.0)
MCHC: 28.5 g/dL — ABNORMAL LOW (ref 30.0–36.0)
MCHC: 28.6 g/dL — AB (ref 30.0–36.0)
MCV: 79 fL (ref 78.0–100.0)
MCV: 80 fL (ref 78.0–100.0)
PLATELETS: 213 10*3/uL (ref 150–400)
Platelets: 197 10*3/uL (ref 150–400)
RBC: 3.33 MIL/uL — AB (ref 4.22–5.81)
RBC: 2.4 MIL/uL — ABNORMAL LOW (ref 4.22–5.81)
RDW: 16.3 % — AB (ref 11.5–15.5)
RDW: 16.5 % — AB (ref 11.5–15.5)
WBC: 38.1 10*3/uL — AB (ref 4.0–10.5)
WBC: 28 10*3/uL — AB (ref 4.0–10.5)

## 2017-06-15 LAB — GLUCOSE, CAPILLARY
GLUCOSE-CAPILLARY: 167 mg/dL — AB (ref 65–99)
GLUCOSE-CAPILLARY: 229 mg/dL — AB (ref 65–99)
GLUCOSE-CAPILLARY: 315 mg/dL — AB (ref 65–99)
Glucose-Capillary: 264 mg/dL — ABNORMAL HIGH (ref 65–99)
Glucose-Capillary: 312 mg/dL — ABNORMAL HIGH (ref 65–99)
Glucose-Capillary: 319 mg/dL — ABNORMAL HIGH (ref 65–99)

## 2017-06-15 LAB — PROTIME-INR
INR: 1.4
PROTHROMBIN TIME: 17.3 s — AB (ref 11.4–15.2)

## 2017-06-15 LAB — PREPARE RBC (CROSSMATCH)

## 2017-06-15 LAB — BASIC METABOLIC PANEL
ANION GAP: 5 (ref 5–15)
BUN: 10 mg/dL (ref 6–20)
CALCIUM: 7.1 mg/dL — AB (ref 8.9–10.3)
CO2: 30 mmol/L (ref 22–32)
Chloride: 98 mmol/L — ABNORMAL LOW (ref 101–111)
Creatinine, Ser: 0.72 mg/dL (ref 0.61–1.24)
Glucose, Bld: 197 mg/dL — ABNORMAL HIGH (ref 65–99)
POTASSIUM: 3.9 mmol/L (ref 3.5–5.1)
Sodium: 133 mmol/L — ABNORMAL LOW (ref 135–145)

## 2017-06-15 LAB — ABO/RH: ABO/RH(D): B POS

## 2017-06-15 LAB — MAGNESIUM: Magnesium: 1.7 mg/dL (ref 1.7–2.4)

## 2017-06-15 LAB — APTT: aPTT: 53 seconds — ABNORMAL HIGH (ref 24–36)

## 2017-06-15 LAB — PHOSPHORUS: PHOSPHORUS: 4.2 mg/dL (ref 2.5–4.6)

## 2017-06-15 MED ORDER — SODIUM CHLORIDE 0.9 % IV SOLN
INTRAVENOUS | Status: DC
  Administered 2017-06-15 – 2017-06-17 (×4): via INTRAVENOUS

## 2017-06-15 MED ORDER — SODIUM CHLORIDE 0.9 % IV SOLN
Freq: Once | INTRAVENOUS | Status: AC
  Administered 2017-06-15: 22:00:00 via INTRAVENOUS

## 2017-06-15 NOTE — Progress Notes (Signed)
PULMONARY / CRITICAL CARE MEDICINE   Name: Jannifer FranklinHlec Bendall MRN: [id number] DOB: [birth year]    ADMISSION DATE:  11-09-17 CONSULTATION DATE:  06/10/2017  REFERRING MD:  Dr. Sharon SellerMcClung  CHIEF COMPLAINT:  Right parapharyngeal and neck abscesses  HISTORY OF PRESENT ILLNESS:  61 neck abscess, I and D, trach, vent   SUBJECTIVE:  Remains on vent Fevers 102 max  VITAL SIGNS: BP (!) 112/55 (BP Location: Left Arm)   Pulse 82   Temp (!) 102.9 F (39.4 C) (Oral) Comment: RN Mardella LaymanLindsey aware   Resp 12   Ht 5' (1.524 m)   Wt 71.8 kg (158 lb 4.6 oz)   SpO2 99%   BMI 30.91 kg/m   HEMODYNAMICS:    VENTILATOR SETTINGS: Vent Mode: PRVC FiO2 (%):  [40 %] 40 % Set Rate:  [16 bmp] 16 bmp Vt Set:  [400 mL] 400 mL PEEP:  [5 cmH20] 5 cmH20 Plateau Pressure:  [12 cmH20-13 cmH20] 12 cmH20  INTAKE / OUTPUT: I/O last 3 completed shifts: In: 4804.8 [I.V.:3544.6; NG/GT:260.3; IV Piggyback:1000] Out: 2625 [Urine:2600; Blood:25]  PHYSICAL EXAMINATION: General: rass -2 Neuro: rass -2, not fc HEENT: dressed, impressive wound, drains PULM: reduced CV: s1 s2 RRR no  GI: soft, bs wnl, no r Extremities: no edema    LABS:  BMET  Recent Labs Lab 06/13/17 0415 05/31/2017 0914 06/15/17 0330  NA 137 137 133*  K 3.9 3.7 3.9  CL 103 102 98*  CO2 28 32 30  BUN 18 13 10   CREATININE 0.92 0.71 0.72  GLUCOSE 252* 60* 197*    Electrolytes  Recent Labs Lab 06/12/17 0458 06/13/17 0415 06/15/2017 0914 06/15/17 0330  CALCIUM 7.6* 7.5* 7.4* 7.1*  MG 1.8 1.9  --  1.7  PHOS 2.4* 3.6  --  4.2    CBC  Recent Labs Lab 06/13/17 0415 05/27/2017 0914 06/15/17 0330  WBC 18.7* 29.9* 38.1*  HGB 7.4* 8.0* 7.5*  HCT 26.3* 27.1* 26.3*  PLT 118* 136* 197    Coag's No results for input(s): APTT, INR in the last 168 hours.  Sepsis Markers  Recent Labs Lab 06/12/17 2324 06/13/17 1230  LATICACIDVEN 2.9* 2.2*    ABG  Recent Labs Lab 06/12/17 0500 05/19/2017 0849  PHART 7.482* 7.392  PCO2ART  35.9 57.4*  PO2ART 111* 111.0*    Liver Enzymes  Recent Labs Lab 06/09/17 0405 06/16/2017 0148  AST 112* 57*  ALT 58 40  ALKPHOS 50 96  BILITOT 5.0* 3.0*  ALBUMIN 1.8* 1.8*    Cardiac Enzymes  Recent Labs Lab 06/08/17 1205  TROPONINI <0.03    Glucose  Recent Labs Lab 05/20/2017 1420 05/24/2017 1600 05/26/2017 1933 05/18/2017 2257 06/15/17 0419 06/15/17 0740  GLUCAP 131* 129* 118* 137* 167* 229*   Imaging Ct Soft Tissue Neck W Contrast  Result Date: 05/21/2017 CLINICAL DATA:  Followup parotiditis. EXAM: CT NECK WITH CONTRAST TECHNIQUE: Multidetector CT imaging of the neck was performed using the standard protocol following the bolus administration of intravenous contrast. CONTRAST:  75 cc Isovue-300 COMPARISON:  06/08/2017, 011-23-18 FINDINGS: There is further worsening by imaging. Patient appears to have diffuse suppurative inflammation of the right parotid and submandibular glands. Diffuse cellulitis/abscess formation throughout the right face and deep spaces with specific findings as below. Low-density fluid, presumably abscess, superficial to the right parotid gland with thickness measuring up to 16 mm. Low-density fluid, presumably abscess, right periorbital preseptal region measuring up to 2 cm in thickness. More superficial subcutaneous fluid in the right cheek measuring up  to 17 mm in thickness. Right parapharyngeal space abscess much larger, measuring 3.5 x 2.7 cm and extending over a length of almost 6 cm, with mass effect upon the oropharynx and hypopharynx. Abscess within the right submandibular gland extending into the sublingual region. No sign of intracranial extension in the limited intracranial images. No sign of postseptal orbital inflammation on the limited images. Diffuse mucosal inflammation of the right maxillary sinus. No layering fluid. Some dental and periodontal disease, but without advanced periodontal disease/abscess of bone. IMPRESSION: Continued worsening  of inflammatory disease of the right face and deep spaces. Most notable changes include enlargement of the right parapharyngeal abscess now measuring up to 3.5 cm in diameter and extending over a length of almost 6 cm with worsened mass effect upon the hypopharynx and pharynx. See above for description of other locations of involvement/extension. Electronically Signed   By: Paulina Fusi M.D.   On: 06/12/2017 09:17    STUDIES:  CT Neck 6/18 > Diffuse facial swelling favored to represent bacterial RIGHT parotitis. Significant facial cellulitis, with RIGHT muscle of mastication enlargement. No visible calculi. No drainable abscess. Minor dental caries and periapical lucencies, not felt to be the source of inflammation. CXR 6/22 > Low lung volumes with right basilar atelectasis. Upper normal heart size likely exaggerated by technique and low lung volumes. Left lung base and costophrenic angle not included in the field of view. Frontal and lateral views may be helpful if patient is able. CT neck 6/22 > Extensive severe multi compartmental infectious/inflammatory process involving the right face from the right periorbital soft tissues to the upper neck. Inflammation involves the all right-sided deep cervical compartments including right parapharyngeal and retropharyngeal spaces. There multiple ill-defined fluid collections probably representing phlegmon. No discrete rim enhancing abscess is Identified. There is rightward displacement of the airway and moderate narrowing of the airway due to fluid collections in parapharyngeal space and swelling of oral and hypopharyngeal mucosa. The origin of infection is uncertain, possibly odontogenic given the presence of odontogenic disease. CT Neck 6/25 > Continued worsening of inflammatory disease of the right face and deep spaces. Most notable changes include enlargement of the right parapharyngeal abscess now measuring up to 3.5 cm in diameter and extending over a  length of almost 6 cm with worsened mass effect upon the hypopharynx and pharynx.  6/27 CxR with ft needing advancement. 6/28 CxR Pending>>  CULTURES: Blood 6/18 > No growth  MRSA PCR 6/18 > Negative  MRSA PCP 6/25 > Negative Staphy aureus 6/25 > Negative  Aerobic/Anareboic Culture 6/25 >> Kleb P. Res to ampicillin  Gram Stain 6/25 > GNR  ANTIBIOTICS: Per ID Clindamycin 6/18 > 6/23 Unasyn 6/18 > 6/22 Vancomycin 6/23 >>>6/28 unasyn 6/27>>> Zosyn 6/23 >>>6/27  SIGNIFICANT EVENTS: 6/18 > Presents to ED 6/25 > Taken to OR for I&D and Trach  6/29- fevers, vent  LINES/TUBES: Trach 6/25 >>  L femoral CVC 6/25 >>  DISCUSSION: 61 year old male presents to ED on 6/18 with right facial cellulitis/parotitis and parapharyngeal abscess s/p I&D and trach on 6/25. 6/28 lighten sedation and wean from vent as tolerated. Lantus stopped and D5w started as FT malpositioned and tube feeds stopped coupled with stopping steroids has led to hypoglycemia.  ASSESSMENT / PLAN:  PULMONARY A: Respiratory Insufficieny in setting of facial cellulitis/focal abscess s/p trach  P:   abg reviewed, keep same mV He needs high sedation and pain control - no SBT able pcxr reviewed, repeat in am  NO PMV!!  Once off vent  CARDIOVASCULAR A:  HTN Elevated LA hyponatraemia  P:  Tele monitoring Saline to 125 Dc d5w  RENAL Lab Results  Component Value Date   CREATININE 0.72 06/15/2017   CREATININE 0.71 05/19/2017   CREATININE 0.92 06/13/2017   CREATININE 0.83 04/05/2015   CREATININE 0.76 07/28/2014   CREATININE 0.80 02/23/2014    Recent Labs Lab 06/13/17 0415 06/11/2017 0914 06/15/17 0330  K 3.9 3.7 3.9     A:   hyponatremia P:   Saline Dc free water  GASTROINTESTINAL A:   Hep C genotype 1 with splenomegaly - ? Untreated Protein energy malnutition P:   PPI Continue tube feeds LF in am   HEMATOLOGIC A:   Thrombocytopenia > improving  Anemia with active wound  bleeding P:  Follow CBC q12h, may need Tx SQ heparin, hold with bleeding scd Get coags  INFECTIOUS A:   Right Acute Parotitis with extensive facial cellulitis and focal right parapharyngeal abscess s/p I&D with Klebsiella P. Res to ampicillin Elevated WBC coung  necrtozing? P:   appears need to go back to OR, per ENT Maintain unasyn Keep clinda  ENDOCRINE CBG (last 3)   Recent Labs  05/19/2017 2257 06/15/17 0419 06/15/17 0740  GLUCAP 137* 167* 229*     A:   DM   P:   SSI coverage lantus slight increase, but dc d5 first  NEUROLOGIC A:   Acute Encephalopathy secondary to sedation  pain P:   RASS goal: -2 to -3 Monitor  Wean propofol PRN fentanyl   Hold off WUA   FAMILY   - Inter-disciplinary family meet or Palliative Care meeting due by:  06/18/17  Ccm time 30 min  Mcarthur Rossetti. Tyson Alias, MD, FACP Pgr: [phone] [facility]

## 2017-06-15 NOTE — Op Note (Signed)
NAME:  [name] [name], [name] [name]                        ACCOUNT NO.:  MEDICAL RECORD NO.:  [id number]  LOCATION:                                 FACILITY:  PHYSICIAN:  Antony Contras, MD     DATE OF BIRTH:  1956-02-12  DATE OF PROCEDURE:  06/13/2017 DATE OF DISCHARGE:                              OPERATIVE REPORT   PREOPERATIVE DIAGNOSIS:  Right parotid, neck, eyelid, parapharyngeal, and temporal abscess.  POSTOPERATIVE DIAGNOSIS:  Right parotid, neck, eyelid, parapharyngeal, and temporal abscess.  PROCEDURES: 1. Debridement of right facial, eyelid, and neck abscesses. 2. Incision and drainage of right temporal abscess. 3. Drainage of right parapharyngeal abscess.  SURGEON:  Antony Contras, MD.  ANESTHESIA:  General endotracheal anesthesia.  COMPLICATIONS:  None.  INDICATIONS:  The patient is a 61 year old, Asian male who had an infection involving the right face and neck for a couple of weeks, having undergone incision and drainage of multiple sites a few days ago with Penrose drain placements.  Tracheostomy was placed at the time as well.  He has continued with significant edema and copious drainage and had spiking up of his white blood counts.  Repeat CT imaging demonstrated fluid collections in the right submandibular gland region, within the right parotid gland, recollecting in the right parapharyngeal space, and in the right temporalis space.  He presents to the operating room for further surgery.  FINDINGS:  Incision in the right temporal area opened a large pocket of pus involving the temporalis muscle area.  Penrose drains were all removed.  Necrotic tissue was seen at the skin level in the upper and lower eyelids as well as involving the soft tissues in all of the drainage areas including the eyelid area, right neck, submental area, and intraoral open wounds.  Much of the visible necrotic tissue was debrided using scissors back to more bleeding tissues.  Some  additional areas of pus were encountered a little towards the left side within the submental opened area as well as going superiorly from the upper lip onto the maxilla.  The parapharyngeal space was drained further with suction and yielded mostly bloody fluid with some cloudiness.  An additional incision was not made at that location.  Rather than placing Penrose drains, saline-soaked Kerlix was packed into all of the areas including the temporalis area, the upper and lower eyelids, the right neck into the cheek, the intraoral spaces, and the submental space.  The parapharyngeal space was not packed in this fashion.  DESCRIPTION OF PROCEDURE:  The patient was identified in the intensive care unit and brought to the operating suite and put on the operating table in supine position.  Anesthesia was induced and the patient was maintained via endotracheal anesthesia via his trach tube.  The Penrose drains were all removed and the entire face and neck were prepped and draped in sterile fashion.  Each of the open wounds were then explored with blunt dissection, both finger dissection, and with a blunt instrument dissection including dissecting down towards the submandibular gland through the submental and lateral neck incisions as well as into the depths of the parotid gland  using a hemostat.  The necrotic tissue was removed from the upper and lower eyelids using scissors.  Necrotic tissue was then removed from the deep tissues of going inferiorly from the lower eyelid onto the skin as well as in the submental area, lateral neck and parotid area, and intraorally including superiorly from the upper lip towards the maxilla.  Additional purulent spaces were identified in that location as well as in the submental area to the left.  Tissues were removed gradually with scissors back to more bleeding tissue.  At this point, the temporal incision was injected with local anesthetic superior to the ear  running vertically and was then made with a 15 blade scalpel.  The temporalis fascia was divided and pus was immediately encountered.  Finger dissection was then performed into the temporalis space along with blunt dissection with an instrument yielding frank yellow pus.  The area was copiously irrigated with saline.  A Crowe-Davis retractor was inserted in the mouth and opened via the oropharynx.  This was placed in suspension on Mayo stand.  The peritonsillar incision was then identified and the parapharyngeal space suctioned through the incision, removing dark cloudy bloody fluid.  At this point, all of the areas were packed with Kerlix soaked in saline including the submental space, lateral neck on the right, parotid region, lower eyelid and upper eyelids, buccal space, and upper lip towards the maxilla.  In addition, the temporalis space was packed in the same way.  The parapharyngeal space intraorally was not packed.  At this point, the patient was cleaned off, returned to Anesthesia for transport and was moved to the intensive care unit in a hemodynamically stable condition.     Antony Contraswight D Yostin Malacara, MD     DDB/MEDQ  D:  06/10/2017  T:  06/16/2017  Job:  [id number]  cc:   Antony Contraswight D Lyana Asbill, MD's Office

## 2017-06-15 NOTE — Progress Notes (Signed)
Inpatient Diabetes Program Recommendations  AACE/ADA: New Consensus Statement on Inpatient Glycemic Control (2015)  Target Ranges:  Prepandial:   less than 140 mg/dL      Peak postprandial:   less than 180 mg/dL (1-2 hours)      Critically ill patients:  140 - 180 mg/dL   Results for [name] [name], [name] [name] (MRN [id number]) as of 06/15/2017 08:32  Ref. Range 06/13/2017 23:46 05/27/2017 03:22 06/13/2017 07:46 06/13/2017 08:51 06/15/2017 11:39 05/30/2017 14:20 06/13/2017 16:00 05/19/2017 19:33  Glucose-Capillary Latest Ref Range: 65 - 99 mg/dL 528170 (H) 413137 (H) 66 51 (L) 65 131 (H) 129 (H) 118 (H)   Results for [name] [name], [name] [name] (MRN [id number]) as of 06/15/2017 08:32  Ref. Range 05/26/2017 22:57 06/15/2017 04:19 06/15/2017 07:40  Glucose-Capillary Latest Ref Range: 65 - 99 mg/dL 536137 (H) 644167 (H) 034229 (H)    Home DM Meds: Glipizide 5 mg daily       Metformin 500 mg BID  Current Insulin Orders: Novolog Sensitive Correction Scale/ SSI (0-9 units) Q4 hours      MD- Note patient was getting Lantus 20 units BID on 06/27.  Had several episodes of Hypoglycemia on 06/28 likely b/c tube feeds were stopped temporarily on 06/28 for procedure.  Lantus discontinued as a result.  CBG elevated to 229 mg/dl at 8am today.  Note that tube feeds restarted at 5am today.  MD- Please consider starting Novolog tube feed coverage: Novolog 3 units Q4 hours (hold if tube feeds are held for any reason)      --Will follow patient during hospitalization--  Ambrose FinlandJeannine Johnston Elonzo Sopp RN, MSN, CDE Diabetes Coordinator Inpatient Glycemic Control Team Team Pager: [phone] (8a-5p)

## 2017-06-15 NOTE — Anesthesia Postprocedure Evaluation (Addendum)
Anesthesia Post Note  Patient: Chaos Wohler  Procedure(s) Performed: Procedure(s) (LRB): Debridement of Right NECK and FACIAL Abscess, Incision and Drainage Right Temporal Abscess, and Drainage of right PARAPHARYNGEAL  ABSCESS (N/A)     Patient location during evaluation: SICU Anesthesia Type: General Level of consciousness: patient remains intubated per anesthesia plan Pain management: pain level controlled Vital Signs Assessment: post-procedure vital signs reviewed and stable Respiratory status: patient remains intubated per anesthesia plan Cardiovascular status: blood pressure returned to baseline and stable Postop Assessment: no signs of nausea or vomiting Anesthetic complications: no    Last Vitals:  Vitals:   06/15/17 0500 06/15/17 0600  BP: 130/73 131/70  Pulse: (!) 101 (!) 101  Resp: 15 16  Temp:      Last Pain:  Vitals:   14-May-2017 1900  TempSrc: Rectal  PainSc:                  Shmuel Girgis DAVID

## 2017-06-15 NOTE — Progress Notes (Addendum)
[facility] for Infectious Disease    Date of Admission:  05/24/2017   Total days of antibiotics 12        Day 3 amp/sub        Day 10 clindamycin   ID: [name] [name] is a 61 y.o. male with right sided facial cellulitis, parotitis which continued to spread causing R-neck and parapharyngeal deep tissue abscess s/p I x D, cultures growing kleb pneumoniae Active Problems:   Acute parotitis   Facial cellulitis   Acute respiratory failure (HCC)    Subjective: Remains sedated and trach'd on vent, had high fevers. Went to the OR, for debridement due to ongoing fevers, and worsening leukocytosis  OR note shows that the debrided large right temporal area involving temporalis muscle. Necrotic tissue to upper and lower eye lid. More purulence noted within left submetnal areas and superior to upper lip. Parapharyngeal had cloudy sanginous fluid.   Temp this am 102.67F  cxr this am shows small left sided effusion per my read  Medications:  . chlorhexidine gluconate (MEDLINE KIT)  15 mL Mouth Rinse BID  . Chlorhexidine Gluconate Cloth  6 each Topical Daily  . feeding supplement (PRO-STAT SUGAR FREE 64)  30 mL Per Tube TID  . fentaNYL (SUBLIMAZE) injection  50 mcg Intravenous Once  . insulin aspart  0-9 Units Subcutaneous Q4H  . mouth rinse  15 mL Mouth Rinse 10 times per day  . mupirocin ointment   Topical BID  . pantoprazole sodium  40 mg Per Tube Daily  . sodium chloride flush  10-40 mL Intracatheter Q12H    Objective: Vital signs in last 24 hours: Temp:  [97.5 F (36.4 C)-103.3 F (39.6 C)] 102.3 F (39.1 C) (06/29 1123) Pulse Rate:  [59-103] 92 (06/29 1200) Resp:  [12-19] 14 (06/29 1200) BP: (88-134)/(50-74) 109/60 (06/29 1200) SpO2:  [96 %-100 %] 100 % (06/29 1203) FiO2 (%):  [40 %] 40 % (06/29 1203) Weight:  [158 lb 4.6 oz (71.8 kg)] 158 lb 4.6 oz (71.8 kg) (06/29 0340) Physical Exam  Constitutional: He is sedated  HEENT: head is wrapped from recent surgery last night.  Tracheostomy is oozing blood as well as gauze in mouth, left sided swelling of mouth Cardiovascular: tachy, regular rhythm and normal heart sounds. + soft murmur Pulmonary/Chest: Effort normal and breath sounds normal. No respiratory distress. He has no wheezes.  Abdominal: Soft. Bowel sounds are decreased He exhibits no distension. Has rectal tube in place Skin: Skin is warm and dry. No rash noted. No erythema.   Lab Results  Recent Labs  06/06/2017 0914 06/15/17 0330  WBC 29.9* 38.1*  HGB 8.0* 7.5*  HCT 27.1* 26.3*  NA 137 133*  K 3.7 3.9  CL 102 98*  CO2 32 30  BUN 13 10  CREATININE 0.71 0.72    Microbiology: 6/25 tissue cx - kleb pneumo = amp R, but otherwise sensitive Studies/Results: Ct Soft Tissue Neck W Contrast  Result Date: 06/13/2017 CLINICAL DATA:  61 y/o M; right facial and right neck swelling. Parotiditis. Evaluate for abscess. EXAM: CT NECK WITH CONTRAST TECHNIQUE: Multidetector CT imaging of the neck was performed using the standard protocol following the bolus administration of intravenous contrast. CONTRAST:  80m ISOVUE-300 IOPAMIDOL (ISOVUE-300) INJECTION 61% COMPARISON:  05/27/2017 CT of the head and neck. FINDINGS: Pharynx and larynx: The complete effacement of the airway due to inflammatory change of pharyngeal mucosa, right parapharyngeal abscess, and debris in the airways. Salivary glands: Diffuse inflammatory change of  right parotid and submandibular glands with separation. Thyroid: Normal. Lymph nodes: Reactive cervical lymphadenopathy. No lymph node abscess. Vascular: Mass effect on the right internal jugular vein at the C2 level without occlusion. Carotid and vertebral arteries of the neck are patent. Internal jugular veins are patent. Limited intracranial: Negative. Visualized orbits: No intraorbital inflammatory changes at this time. Mastoids and visualized paranasal sinuses: Worsening paranasal sinus disease with near complete opacification of ethmoid air  cells and multiple air-fluid levels. Increasing effusions within the mastoid air cells. Skeleton: Numerous dental caries compatible with odontogenic disease. No large dental abscess is identified. Upper chest: Tracheostomy in situ. Small right greater than left pleural effusions. Other: Interval incision and drainage of right periorbital, right facial subcutaneous, and right paramandibular fluid collections. There are multiple drains in situ. Right facial subcutaneous, right periorbital, and right perimandibular soft tissue fluid collections are much decreased in size post debridement. Extensive inflammation, phlegmon, and multiple abscesses are present involving right temporal fossa, right periorbital soft tissues, subcutaneous fat of the right face, right masticator space, right parotid space, right carotid space, right parapharyngeal space, right greater left submandibular spaces, and right greater than left sublingual spaces. Addition, there is extensive inflammatory change of pharyngeal mucosa with complete effacement of the airway. There are extensive residual phlegmonous changes within the right masticator compartment. Stable 34 x 15 mm abscess in the right sublingual space and right submandibular gland (series 3, image 78). Grossly stable right parapharyngeal abscess measuring 30 x 22 mm axially at approximately 60 mm craniocaudal from skullbase to hyoid bone (series 3, image 61 and series 6, image 60) Retropharyngeal fluid is present from the adenoid tonsils to the C6 level. No definite evidence for upper mediastinitis. Not included in the field of view of the prior study is a abscess within the right temporalis muscle within the right lateral scalp measuring up to 12 mm in thickness. IMPRESSION: 1. Interval debridement of right periorbital, right facial subcutaneous, and right paramandibular fluid collections with multiple drains in situ. Debrided collections are much decreased in size in comparison with  the prior study. 2. Stable large right parapharyngeal abscess measuring up to 6 mm craniocaudal. 3. Increased effacement of the pharyngeal airway, now complete, due to mass effect and debris. 4. Interval worsening of paranasal sinus and mastoid disease with fluid levels probably due to poor drainage due to airway effacement. 5. Large abscess within right temporalis muscle in the right lateral scalp, not included within the field of view on prior study. 6. Stable abscess within the right sublingual space and right submandibular gland. 7. Stable extensive suppurative change within right parotid and submandibular glands. Electronically Signed   By: Kristine Garbe M.D.   On: 06/02/2017 22:21   Dg Chest Port 1 View  Result Date: 06/15/2017 CLINICAL DATA:  Tracheostomy tube . EXAM: PORTABLE CHEST 1 VIEW COMPARISON:  05/31/2017 . FINDINGS: Tracheostomy tube and right PICC line stable position. Feeding tube is been advanced, its tip is below the left hemidiaphragm. Heart size stable. Low lung volumes. No focal infiltrate. Tiny left pleural effusion cannot be excluded. No pneumothorax. IMPRESSION: 1. Interim advancement of feeding tube, its tip is below the left hemidiaphragm. Tracheostomy tube and right PICC line stable position. 2. Low lung volumes.  Tiny left pleural effusion cannot be excluded. Electronically Signed   By: Marcello Moores  Register   On: 06/15/2017 06:43   Dg Chest Port 1 View  Result Date: 06/07/2017 CLINICAL DATA:  The tori failure EXAM: PORTABLE CHEST 1 VIEW  COMPARISON:  Portable exam 0840 hours compared to 06/13/2017 FINDINGS: Tracheostomy tube projects over tracheal air column. RIGHT arm PICC line tip projects over SVC above cavoatrial junction. Feeding tube is now coiled in the cervical esophagus with tip projecting over distal esophagus ; recommend withdrawal and replacement. Borderline enlargement of cardiac silhouette. Mediastinal contours and pulmonary vascularity normal. Minimal  bibasilar atelectasis with improved aeration in LEFT lower lobe since previous exam. Upper lungs clear. No pleural effusion or pneumothorax. IMPRESSION: Improved bibasilar aeration with mild residual bibasilar atelectasis. Feeding tube coiled in cervical esophagus with tip projecting over the distal esophagus ; recommend withdrawal and replacement. Findings called to [facility] on [place] on 06/05/2017 at 0855 hrs. Electronically Signed   By: Lavonia Dana M.D.   On: 05/29/2017 08:58   Dg Abd Portable 1v  Result Date: 06/15/2017 CLINICAL DATA:  NG tube placement EXAM: PORTABLE ABDOMEN - 1 VIEW COMPARISON:  05/31/2017 FINDINGS: Esophageal tube tip overlies the distal stomach. Abdomen is gasless. IMPRESSION: Esophageal tube tip overlies the distal stomach Electronically Signed   By: Donavan Foil M.D.   On: 06/15/2017 00:42   Dg Abd Portable 1v  Result Date: 06/06/2017 CLINICAL DATA:  Feeding tube advanced. EXAM: PORTABLE ABDOMEN - 1 VIEW COMPARISON:  06/13/2017 FINDINGS: Soft feeding tube tip has advanced about 5 cm, now overlying the body of the stomach. The majority of the tube advancement has resulted in coli rolling in the distal esophagus. Gas pattern remains unremarkable. IMPRESSION: Soft feeding tube tip advanced slightly into the body of the stomach. Most of the tube advancement has resulted in coiling in the distal esophagus. Electronically Signed   By: Nelson Chimes M.D.   On: 06/08/2017 10:16   Dg Abd Portable 1v  Result Date: 06/13/2017 CLINICAL DATA:  NG tube placement EXAM: PORTABLE ABDOMEN - 1 VIEW COMPARISON:  05/30/2017 FINDINGS: Weighted tip enteric tube overlies the expected location the distal esophagus. Paucity of bowel gas without evidence of enteric obstruction. No supine evidence of pneumoperitoneum. No definite pneumatosis or portal venous gas Limited visualization of lower thorax is normal. Left femoral central venous catheter overlies the superior aspect the left sacral ala. No  acute osseus abnormalities. IMPRESSION: 1. Enteric tube tip overlies the distal esophagus. Advancement at least 10 cm is recommended. 2. Paucity of bowel gas without evidence of enteric obstruction. Electronically Signed   By: Sandi Mariscal M.D.   On: 06/13/2017 21:32     Assessment/Plan: Deep tissue, neck abscess/parapharyngeal abscess= cultures growing kleb pneumo, but clinical picture appears classic for strep/staph though it was not isolated. He still has fevers and leukocytosis s/p debridement last night  Leukocytosis = likely as a response to current infection and source control. Agree with need for repeat debridement.  - please resend cultures at next debridement - continue on amp/sub plus clindamycin  Respiratory insufficiency = due to anatomical swelling from infection. Continue with vent needs per PCCM  Bleeding around trach site = recommend checking coags and CBC more frequently. likey due to infection but also has hx of hep c, though not thought to be advanced liver disease  Diarrhea = antibiotic associated. cdiff ruled out  I have discussed plan with dr Vassie Moment, [facility] for Infectious Diseases Cell: [phone] Pager: [phone]  06/15/2017, 1:39 PM

## 2017-06-15 NOTE — Progress Notes (Signed)
   Subjective:    Patient ID: [name] [name], male    DOB: 1956/05/05, 61 y.o.   MRN: [id number]  HPI Sedated in ICU.  Nursing reported a lot of drainage from wounds.  Review of Systems     Objective:   Physical Exam Tm 103.3  VSS Sedated.  Mechanical ventilator. Trach site stable with cuff inflated. Changed gauze packing in eyelids, temporal space, right neck/cheek, submental space, premaxilla, and buccal space. Left neck with some edema.    Assessment & Plan:  Right parotid, neck, parapharyngeal, eyelid, temporal abscesses  Changed gauze packing.  WBC quite elevated, remains febrile.  Will continue packing changes.  May return to OR over the weekend.  May need left neck opened.

## 2017-06-15 NOTE — Progress Notes (Signed)
CRITICAL VALUE ALERT  Critical Value:  hgb 5.5  Date & Time Notied:  elink MD  Provider Notified: McQuaid  Orders Received/Actions taken: 1 unit PRBC's

## 2017-06-15 NOTE — Progress Notes (Signed)
eLink Physician-Brief Progress Note Patient Name: [name] FranklinHlec Zackery DOB: 1955/12/28 MRN: [id number]   Date of Service  06/15/2017  HPI/Events of Note  Slow oozing from face  eICU Interventions  Order 1 U PRBC now Repeat PT/INR in AM     Intervention Category Major Interventions: Hemorrhage - evaluation and management  Max FickleDouglas Tracker Mance 06/15/2017, 8:36 PM

## 2017-06-16 ENCOUNTER — Inpatient Hospital Stay (HOSPITAL_COMMUNITY): Payer: 59

## 2017-06-16 DIAGNOSIS — Z9911 Dependence on respirator [ventilator] status: Secondary | ICD-10-CM

## 2017-06-16 DIAGNOSIS — A498 Other bacterial infections of unspecified site: Secondary | ICD-10-CM

## 2017-06-16 DIAGNOSIS — J390 Retropharyngeal and parapharyngeal abscess: Secondary | ICD-10-CM

## 2017-06-16 DIAGNOSIS — Z930 Tracheostomy status: Secondary | ICD-10-CM

## 2017-06-16 LAB — GLUCOSE, CAPILLARY
GLUCOSE-CAPILLARY: 319 mg/dL — AB (ref 65–99)
GLUCOSE-CAPILLARY: 288 mg/dL — AB (ref 65–99)
GLUCOSE-CAPILLARY: 269 mg/dL — AB (ref 65–99)
GLUCOSE-CAPILLARY: 284 mg/dL — AB (ref 65–99)
Glucose-Capillary: 299 mg/dL — ABNORMAL HIGH (ref 65–99)
Glucose-Capillary: 198 mg/dL — ABNORMAL HIGH (ref 65–99)
Glucose-Capillary: 234 mg/dL — ABNORMAL HIGH (ref 65–99)

## 2017-06-16 LAB — AEROBIC/ANAEROBIC CULTURE W GRAM STAIN (SURGICAL/DEEP WOUND)

## 2017-06-16 LAB — PREPARE RBC (CROSSMATCH)

## 2017-06-16 LAB — CBC
HCT: 24.8 % — ABNORMAL LOW (ref 39.0–52.0)
HCT: 21.4 % — ABNORMAL LOW (ref 39.0–52.0)
HEMATOCRIT: 21.1 % — AB (ref 39.0–52.0)
HEMOGLOBIN: 6.3 g/dL — AB (ref 13.0–17.0)
HEMOGLOBIN: 6.7 g/dL — AB (ref 13.0–17.0)
Hemoglobin: 7.8 g/dL — ABNORMAL LOW (ref 13.0–17.0)
MCH: 23.9 pg — AB (ref 26.0–34.0)
MCH: 25.3 pg — ABNORMAL LOW (ref 26.0–34.0)
MCH: 25.4 pg — ABNORMAL LOW (ref 26.0–34.0)
MCHC: 29.9 g/dL — ABNORMAL LOW (ref 30.0–36.0)
MCHC: 31.5 g/dL (ref 30.0–36.0)
MCHC: 31.3 g/dL (ref 30.0–36.0)
MCV: 79.9 fL (ref 78.0–100.0)
MCV: 80.5 fL (ref 78.0–100.0)
MCV: 81.1 fL (ref 78.0–100.0)
PLATELETS: 131 10*3/uL — AB (ref 150–400)
PLATELETS: 180 10*3/uL (ref 150–400)
Platelets: 160 10*3/uL (ref 150–400)
RBC: 2.64 MIL/uL — AB (ref 4.22–5.81)
RBC: 3.08 MIL/uL — ABNORMAL LOW (ref 4.22–5.81)
RBC: 2.64 MIL/uL — ABNORMAL LOW (ref 4.22–5.81)
RDW: 15.2 % (ref 11.5–15.5)
RDW: 15.1 % (ref 11.5–15.5)
RDW: 15.5 % (ref 11.5–15.5)
WBC: 21 10*3/uL — AB (ref 4.0–10.5)
WBC: 31 10*3/uL — AB (ref 4.0–10.5)
WBC: 29.6 10*3/uL — ABNORMAL HIGH (ref 4.0–10.5)

## 2017-06-16 LAB — HEPATIC FUNCTION PANEL
ALK PHOS: 104 U/L (ref 38–126)
ALT: 31 U/L (ref 17–63)
AST: 68 U/L — AB (ref 15–41)
Albumin: <1 g/dL — ABNORMAL LOW (ref 3.5–5.0)
BILIRUBIN DIRECT: 1.5 mg/dL — AB (ref 0.1–0.5)
BILIRUBIN TOTAL: 2.5 mg/dL — AB (ref 0.3–1.2)
Indirect Bilirubin: 1 mg/dL — ABNORMAL HIGH (ref 0.3–0.9)
Total Protein: 4.9 g/dL — ABNORMAL LOW (ref 6.5–8.1)

## 2017-06-16 LAB — AEROBIC/ANAEROBIC CULTURE (SURGICAL/DEEP WOUND)

## 2017-06-16 LAB — PROTIME-INR
INR: 1.3
Prothrombin Time: 16.3 seconds — ABNORMAL HIGH (ref 11.4–15.2)

## 2017-06-16 LAB — TRIGLYCERIDES: Triglycerides: 183 mg/dL — ABNORMAL HIGH (ref <150)

## 2017-06-16 MED ORDER — CHLORHEXIDINE GLUCONATE CLOTH 2 % EX PADS
6.0000 | MEDICATED_PAD | Freq: Once | CUTANEOUS | Status: AC
  Administered 2017-06-16: 6 via TOPICAL

## 2017-06-16 MED ORDER — SODIUM CHLORIDE 0.9 % IV SOLN: Freq: Once | INTRAVENOUS | Status: DC

## 2017-06-16 MED ORDER — INSULIN GLARGINE 100 UNIT/ML ~~LOC~~ SOLN
15.0000 [IU] | Freq: Every day | SUBCUTANEOUS | Status: DC
  Administered 2017-06-16: 15 [IU] via SUBCUTANEOUS
  Filled 2017-06-16 (×3): qty 0.15

## 2017-06-16 MED ORDER — INSULIN ASPART 100 UNIT/ML ~~LOC~~ SOLN
4.0000 [IU] | SUBCUTANEOUS | Status: DC
  Administered 2017-06-16 – 2017-06-18 (×7): 4 [IU] via SUBCUTANEOUS

## 2017-06-16 MED ORDER — CHLORHEXIDINE GLUCONATE CLOTH 2 % EX PADS
6.0000 | MEDICATED_PAD | Freq: Once | CUTANEOUS | Status: AC
  Administered 2017-06-17: 6 via TOPICAL

## 2017-06-16 MED ORDER — SODIUM CHLORIDE 0.9 % IV SOLN
Freq: Once | INTRAVENOUS | Status: AC
  Administered 2017-06-16: 06:00:00 via INTRAVENOUS

## 2017-06-16 MED ORDER — INSULIN ASPART 100 UNIT/ML ~~LOC~~ SOLN
2.0000 [IU] | SUBCUTANEOUS | Status: DC
  Administered 2017-06-16 (×4): 6 [IU] via SUBCUTANEOUS
  Administered 2017-06-17 (×3): 2 [IU] via SUBCUTANEOUS
  Administered 2017-06-17: 4 [IU] via SUBCUTANEOUS
  Administered 2017-06-18: 2 [IU] via SUBCUTANEOUS
  Administered 2017-06-18 – 2017-06-19 (×2): 6 [IU] via SUBCUTANEOUS
  Administered 2017-06-19: 4 [IU] via SUBCUTANEOUS
  Administered 2017-06-19 (×2): 6 [IU] via SUBCUTANEOUS
  Administered 2017-06-19: 2 [IU] via SUBCUTANEOUS
  Administered 2017-06-19 – 2017-06-20 (×3): 4 [IU] via SUBCUTANEOUS
  Administered 2017-06-20 (×2): 6 [IU] via SUBCUTANEOUS
  Administered 2017-06-20 – 2017-06-21 (×3): 4 [IU] via SUBCUTANEOUS
  Administered 2017-06-21: 6 [IU] via SUBCUTANEOUS

## 2017-06-16 MED ORDER — INSULIN ASPART 100 UNIT/ML ~~LOC~~ SOLN
10.0000 [IU] | Freq: Once | SUBCUTANEOUS | Status: AC
  Administered 2017-06-16: 10 [IU] via INTRAVENOUS

## 2017-06-16 NOTE — Progress Notes (Signed)
CRITICAL VALUE ALERT  Critical Value:  Hgb 6.7  Date & Time Notied:  6:20 PM 06/16/17  Provider Notified: Dr. Katrinka BlazingSmith  Orders Received/Actions taken: 2 units PRBC

## 2017-06-16 NOTE — Progress Notes (Signed)
06/16/2017 10:51 AM  Recker, Brayant [id number]     Temp:  [100.1 F (37.8 C)-103.6 F (39.8 C)] 100.9 F (38.3 C) (06/30 0845) Pulse Rate:  [76-104] 95 (06/30 1000) Resp:  [11-28] 14 (06/30 1000) BP: (109-151)/(54-92) 130/64 (06/30 1000) SpO2:  [96 %-100 %] 97 % (06/30 1000) FiO2 (%):  [30 %-40 %] 30 % (06/30 0756) Weight:  [75.5 kg (166 lb 7.2 oz)] 75.5 kg (166 lb 7.2 oz) (06/30 0421),     Intake/Output Summary (Last 24 hours) at 06/16/17 1051 Last data filed at 06/16/17 0900  Gross per 24 hour  Intake          5290.69 ml  Output             1890 ml  Net          3400.69 ml    Results for orders placed or performed during the hospital encounter of 05/24/2017 (from the past 24 hour(s))  Glucose, capillary     Status: Abnormal   Collection Time: 06/15/17 11:19 AM  Result Value Ref Range   Glucose-Capillary 312 (H) 65 - 99 mg/dL   Comment 1 Capillary Specimen    Comment 2 Notify RN   APTT     Status: Abnormal   Collection Time: 06/15/17 11:25 AM  Result Value Ref Range   aPTT 53 (H) 24 - 36 seconds  Protime-INR     Status: Abnormal   Collection Time: 06/15/17 11:25 AM  Result Value Ref Range   Prothrombin Time 17.3 (H) 11.4 - 15.2 seconds   INR 1.40   Glucose, capillary     Status: Abnormal   Collection Time: 06/15/17  3:44 PM  Result Value Ref Range   Glucose-Capillary 264 (H) 65 - 99 mg/dL   Comment 1 Capillary Specimen   Glucose, capillary     Status: Abnormal   Collection Time: 06/15/17  7:22 PM  Result Value Ref Range   Glucose-Capillary 315 (H) 65 - 99 mg/dL   Comment 1 Capillary Specimen   CBC     Status: Abnormal   Collection Time: 06/15/17  8:08 PM  Result Value Ref Range   WBC 28.0 (H) 4.0 - 10.5 K/uL   RBC 2.40 (L) 4.22 - 5.81 MIL/uL   Hemoglobin 5.5 (LL) 13.0 - 17.0 g/dL   HCT 40.919.2 (L) 81.139.0 - 91.452.0 %   MCV 80.0 78.0 - 100.0 fL   MCH 22.9 (L) 26.0 - 34.0 pg   MCHC 28.6 (L) 30.0 - 36.0 g/dL   RDW 78.216.5 (H) 95.611.5 - 21.315.5 %   Platelets 213 150 - 400 K/uL   Prepare RBC     Status: None   Collection Time: 06/15/17  8:32 PM  Result Value Ref Range   Order Confirmation ORDER PROCESSED BY BLOOD BANK   Type and screen [facility]     Status: None (Preliminary result)   Collection Time: 06/15/17  8:51 PM  Result Value Ref Range   ABO/RH(D) B POS    Antibody Screen NEG    Sample Expiration 06/18/2017    Unit Number [id number]    Blood Component Type RED CELLS,LR    Unit division 00    Status of Unit ISSUED,FINAL    Transfusion Status OK TO TRANSFUSE    Crossmatch Result Compatible    Unit Number [id number]    Blood Component Type RED CELLS,LR    Unit division 00    Status of Unit ISSUED    Transfusion Status  OK TO TRANSFUSE    Crossmatch Result Compatible   ABO/Rh     Status: None   Collection Time: 06/15/17  8:51 PM  Result Value Ref Range   ABO/RH(D) B POS   Glucose, capillary     Status: Abnormal   Collection Time: 06/15/17 11:40 PM  Result Value Ref Range   Glucose-Capillary 319 (H) 65 - 99 mg/dL   Comment 1 Notify RN   CBC     Status: Abnormal   Collection Time: 06/16/17  4:07 AM  Result Value Ref Range   WBC 21.0 (H) 4.0 - 10.5 K/uL   RBC 2.64 (L) 4.22 - 5.81 MIL/uL   Hemoglobin 6.3 (LL) 13.0 - 17.0 g/dL   HCT 40.9 (L) 81.1 - 91.4 %   MCV 79.9 78.0 - 100.0 fL   MCH 23.9 (L) 26.0 - 34.0 pg   MCHC 29.9 (L) 30.0 - 36.0 g/dL   RDW 78.2 95.6 - 21.3 %   Platelets 131 (L) 150 - 400 K/uL  Hepatic function panel     Status: Abnormal   Collection Time: 06/16/17  4:07 AM  Result Value Ref Range   Total Protein 4.9 (L) 6.5 - 8.1 g/dL   Albumin <0.8 (L) 3.5 - 5.0 g/dL   AST 68 (H) 15 - 41 U/L   ALT 31 17 - 63 U/L   Alkaline Phosphatase 104 38 - 126 U/L   Total Bilirubin 2.5 (H) 0.3 - 1.2 mg/dL   Bilirubin, Direct 1.5 (H) 0.1 - 0.5 mg/dL   Indirect Bilirubin 1.0 (H) 0.3 - 0.9 mg/dL  Protime-INR     Status: Abnormal   Collection Time: 06/16/17  4:07 AM  Result Value Ref Range   Prothrombin Time 16.3 (H)  11.4 - 15.2 seconds   INR 1.30   Glucose, capillary     Status: Abnormal   Collection Time: 06/16/17  4:08 AM  Result Value Ref Range   Glucose-Capillary 299 (H) 65 - 99 mg/dL   Comment 1 Notify RN   Prepare RBC     Status: None   Collection Time: 06/16/17  5:57 AM  Result Value Ref Range   Order Confirmation ORDER PROCESSED BY BLOOD BANK   Glucose, capillary     Status: Abnormal   Collection Time: 06/16/17  8:02 AM  Result Value Ref Range   Glucose-Capillary 319 (H) 65 - 99 mg/dL   Comment 1 Capillary Specimen    Comment 2 Notify RN   Triglycerides     Status: Abnormal   Collection Time: 06/16/17  8:13 AM  Result Value Ref Range   Triglycerides 183 (H) <150 mg/dL    SUBJECTIVE:  Still sedated, on vent per trach.  Nurses note persistent copious hemorrhage from mouth.     OBJECTIVE:  Bleeding diffusely from buccal space wounds.    Still frank pus across nasal dorsum.  Necrotic tissue in upper and lower eyelid.  Frank pus in anterior temporal fossa.  Minimal necrotic tissue in lower buccal space and submandibular triangle.  Pitting edema and induration LEFT submandibular space.  NG nearly extruded.    IMPRESSION:  Wound hemorrhage, with slightly elevated INR.  Persistent infection with frank pus in multiple locations.  Buccal wound hemorrage.  Fever, WBC better.  PLAN:  Discussed with family.  Removed all dressings and coagulated multiple sites in buccal area with electrocautery.  Janina Mayo ties replaced.  NG tube removed and replaced.  Saline moistened Kerlix placed in all wounds.  Hemostasis observed. Well tol.  By pt with additional boluses of Propofol and Fentanyl.    Plan for repeat exploration and debridement in OR tomorrow AM.  Discussed with family and nurses.  Follow   Flo Shanks

## 2017-06-16 NOTE — Progress Notes (Signed)
Pt core temp of 100.9 F RN aware pt on cooling blanket at this time.

## 2017-06-16 NOTE — Progress Notes (Signed)
PULMONARY / CRITICAL CARE MEDICINE   Name: [name] [name] MRN: [id number] DOB: August 03, 1956    ADMISSION DATE:  06/09/17 CONSULTATION DATE:  05/19/2017  REFERRING MD:  Dr. Sharon Seller  CHIEF COMPLAINT:  Right parapharyngeal and neck abscesses  HISTORY OF PRESENT ILLNESS:  61 neck abscess, I and D, trach, vent   SUBJECTIVE:  Remains vented Calm No WUA Lower grade temps Anemia, 2 unit prbc  VITAL SIGNS: BP (!) 151/72   Pulse 96   Temp (!) 100.7 F (38.2 C)   Resp 13   Ht 5' (1.524 m)   Wt 75.5 kg (166 lb 7.2 oz)   SpO2 97%   BMI 32.51 kg/m   HEMODYNAMICS:    VENTILATOR SETTINGS: Vent Mode: PRVC FiO2 (%):  [30 %-40 %] 30 % Set Rate:  [16 bmp] 16 bmp Vt Set:  [400 mL] 400 mL PEEP:  [5 cmH20] 5 cmH20 Plateau Pressure:  [8 cmH20-16 cmH20] 16 cmH20  INTAKE / OUTPUT: I/O last 3 completed shifts: In: 6972.1 [I.V.:4619.7; Blood:390.2; NG/GT:1112.3; IV Piggyback:850] Out: 3010 [Urine:2960; Stool:25; Blood:25]  PHYSICAL EXAMINATION: General: rass -3 Neuro: rass -3, not fc HEENT: dressed, dry, swollen face PULM: CTA reduced CV: s1 s2 RRR no  GI: soft, BS wnl, no r Extremities: mild gen edema    LABS:  BMET  Recent Labs Lab 06/13/17 0415 06/05/2017 0914 06/15/17 0330  NA 137 137 133*  K 3.9 3.7 3.9  CL 103 102 98*  CO2 28 32 30  BUN 18 13 10   CREATININE 0.92 0.71 0.72  GLUCOSE 252* 60* 197*    Electrolytes  Recent Labs Lab 06/12/17 0458 06/13/17 0415 05/31/2017 0914 06/15/17 0330  CALCIUM 7.6* 7.5* 7.4* 7.1*  MG 1.8 1.9  --  1.7  PHOS 2.4* 3.6  --  4.2    CBC  Recent Labs Lab 06/15/17 0330 06/15/17 2008 06/16/17 0407  WBC 38.1* 28.0* 21.0*  HGB 7.5* 5.5* 6.3*  HCT 26.3* 19.2* 21.1*  PLT 197 213 131*    Coag's  Recent Labs Lab 06/15/17 1125 06/16/17 0407  APTT 53*  --   INR 1.40 1.30    Sepsis Markers  Recent Labs Lab 06/12/17 2324 06/13/17 1230  LATICACIDVEN 2.9* 2.2*    ABG  Recent Labs Lab 06/12/17 0500  05/23/2017 0849  PHART 7.482* 7.392  PCO2ART 35.9 57.4*  PO2ART 111* 111.0*    Liver Enzymes  Recent Labs Lab 06/09/2017 0148 06/16/17 0407  AST 57* 68*  ALT 40 31  ALKPHOS 96 104  BILITOT 3.0* 2.5*  ALBUMIN 1.8* <1.0*    Cardiac Enzymes No results for input(s): TROPONINI, PROBNP in the last 168 hours.  Glucose  Recent Labs Lab 06/15/17 0740 06/15/17 1119 06/15/17 1544 06/15/17 1922 06/15/17 2340 06/16/17 0408  GLUCAP 229* 312* 264* 315* 319* 299*   Imaging Ct Soft Tissue Neck W Contrast  Result Date: 05/24/2017 CLINICAL DATA:  Followup parotiditis. EXAM: CT NECK WITH CONTRAST TECHNIQUE: Multidetector CT imaging of the neck was performed using the standard protocol following the bolus administration of intravenous contrast. CONTRAST:  75 cc Isovue-300 COMPARISON:  06/08/2017, 06/09/17 FINDINGS: There is further worsening by imaging. Patient appears to have diffuse suppurative inflammation of the right parotid and submandibular glands. Diffuse cellulitis/abscess formation throughout the right face and deep spaces with specific findings as below. Low-density fluid, presumably abscess, superficial to the right parotid gland with thickness measuring up to 16 mm. Low-density fluid, presumably abscess, right periorbital preseptal region measuring up to 2 cm in  thickness. More superficial subcutaneous fluid in the right cheek measuring up to 17 mm in thickness. Right parapharyngeal space abscess much larger, measuring 3.5 x 2.7 cm and extending over a length of almost 6 cm, with mass effect upon the oropharynx and hypopharynx. Abscess within the right submandibular gland extending into the sublingual region. No sign of intracranial extension in the limited intracranial images. No sign of postseptal orbital inflammation on the limited images. Diffuse mucosal inflammation of the right maxillary sinus. No layering fluid. Some dental and periodontal disease, but without advanced periodontal  disease/abscess of bone. IMPRESSION: Continued worsening of inflammatory disease of the right face and deep spaces. Most notable changes include enlargement of the right parapharyngeal abscess now measuring up to 3.5 cm in diameter and extending over a length of almost 6 cm with worsened mass effect upon the hypopharynx and pharynx. See above for description of other locations of involvement/extension. Electronically Signed   By: Paulina FusiMark  Shogry M.D.   On: 05/18/2017 09:17    STUDIES:  CT Neck 6/18 > Diffuse facial swelling favored to represent bacterial RIGHT parotitis. Significant facial cellulitis, with RIGHT muscle of mastication enlargement. No visible calculi. No drainable abscess. Minor dental caries and periapical lucencies, not felt to be the source of inflammation. CXR 6/22 > Low lung volumes with right basilar atelectasis. Upper normal heart size likely exaggerated by technique and low lung volumes. Left lung base and costophrenic angle not included in the field of view. Frontal and lateral views may be helpful if patient is able. CT neck 6/22 > Extensive severe multi compartmental infectious/inflammatory process involving the right face from the right periorbital soft tissues to the upper neck. Inflammation involves the all right-sided deep cervical compartments including right parapharyngeal and retropharyngeal spaces. There multiple ill-defined fluid collections probably representing phlegmon. No discrete rim enhancing abscess is Identified. There is rightward displacement of the airway and moderate narrowing of the airway due to fluid collections in parapharyngeal space and swelling of oral and hypopharyngeal mucosa. The origin of infection is uncertain, possibly odontogenic given the presence of odontogenic disease. CT Neck 6/25 > Continued worsening of inflammatory disease of the right face and deep spaces. Most notable changes include enlargement of the right parapharyngeal abscess now  measuring up to 3.5 cm in diameter and extending over a length of almost 6 cm with worsened mass effect upon the hypopharynx and pharynx.  6/27 CxR with ft needing advancement. 6/28 CxR Pending>>  CULTURES: Blood 6/18 > No growth  MRSA PCR 6/18 > Negative  MRSA PCP 6/25 > Negative Staphy aureus 6/25 > Negative  Aerobic/Anareboic Culture 6/25 >> Kleb P. Res to ampicillin  Gram Stain 6/25 > GNR  ANTIBIOTICS: Per ID Clindamycin 6/18 > 6/23>>> Unasyn 6/18 > 6/22 Vancomycin 6/23 >>>6/28 unasyn 6/27>>> Zosyn 6/23 >>>6/27  SIGNIFICANT EVENTS: 6/18 > Presents to ED 6/25 > Taken to OR for I&D and Trach  6/29- fevers, vent  LINES/TUBES: Trach 6/25 >>  L femoral CVC 6/25 >>  DISCUSSION: 61 year old male presents to ED on 6/18 with right facial cellulitis/parotitis and parapharyngeal abscess s/p I&D and trach on 6/25. 6/28 lighten sedation and wean from vent as tolerated. Lantus stopped and D5w started as FT malpositioned and tube feeds stopped coupled with stopping steroids has led to hypoglycemia.  ASSESSMENT / PLAN:  PULMONARY A: Respiratory Insufficieny in setting of facial cellulitis/focal abscess s/p trach  P:   Was pos 3.2 liters, at risk edema, pcxr in am  Keep same  MV on vent No sbt , assess OR trip needs and pain assessment, if no OR trip will consider WUA and SBT  CARDIOVASCULAR A:  HTN Elevated LA hyponatraemia  P:  Tele monitoring Saline to 125, lower rate  RENAL Lab Results  Component Value Date   CREATININE 0.72 06/15/2017   CREATININE 0.71 05/19/2017   CREATININE 0.92 06/13/2017   CREATININE 0.83 04/05/2015   CREATININE 0.76 07/28/2014   CREATININE 0.80 02/23/2014    Recent Labs Lab 06/13/17 0415 06/11/2017 0914 06/15/17 0330  K 3.9 3.7 3.9     A:   hyponatremia P:   Saline, reduce rate Chem in am   GASTROINTESTINAL A:   Hep C genotype 1 with splenomegaly - ? Untreated Protein energy malnutition P:   PPI Continue tube  feeds, consider pivot , mechanical vent, ent pt LF in am are wnl  HEMATOLOGIC A:   Thrombocytopenia > improving  Anemia with active wound bleeding P:  Follow CBC q12h, received 2 units over last 24 hours scd Get coags - wnl Get plat in am , slight drop,   INFECTIOUS A:   Right Acute Parotitis with extensive facial cellulitis and focal right parapharyngeal abscess s/p I&D with Klebsiella P. Res to ampicillin Elevated WBC coung  necrtozing? P:   D/w ID 6/30 Maintain unasyn Keep clinda  ENDOCRINE CBG (last 3)   Recent Labs  06/15/17 1922 06/15/17 2340 06/16/17 0408  GLUCAP 315* 319* 299*     A:   DM , at risk poor wound helaing P:   SSI coverage to moderate lantus increase, ensure re add to [facility] Add TF coverage  NEUROLOGIC A:   Acute Encephalopathy secondary to sedation  pain P:   RASS goal: -3 if to OR< if not, them will wua and rass to -1 Wean propofol, follow up TG again, likely will need to abort given prior tg since we have had pos 3 liters PRN fentanyl   Hold off WUA   FAMILY  I  udpated famly - Inter-disciplinary family meet or Palliative Care meeting due by:  06/18/17  Ccm time 30 min  Mcarthur Rossetti. Tyson Alias, MD, FACP Pgr: [phone] [facility]

## 2017-06-16 NOTE — Progress Notes (Signed)
Pt temp 100.5 Core from cooling blanket monitoring. RN aware.

## 2017-06-16 NOTE — Progress Notes (Signed)
Subjective:  on ventilator, sedated   Antibiotics:  Anti-infectives    Start     Dose/Rate Route Frequency Ordered Stop   06/09/2017 1600  clindamycin (CLEOCIN) IVPB 900 mg     900 mg 100 mL/hr over 30 Minutes Intravenous Every 8 hours 06/10/2017 1505     06/13/17 1800  Ampicillin-Sulbactam (UNASYN) 3 g in sodium chloride 0.9 % 100 mL IVPB     3 g 200 mL/hr over 30 Minutes Intravenous Every 6 hours 06/13/17 1555     06/12/17 1800  vancomycin (VANCOCIN) IVPB 1000 mg/200 mL premix  Status:  Discontinued     1,000 mg 200 mL/hr over 60 Minutes Intravenous Every 8 hours 06/12/17 1244 05/21/2017 0936   06/12/17 1430  clindamycin (CLEOCIN) IVPB 900 mg  Status:  Discontinued     900 mg 100 mL/hr over 30 Minutes Intravenous Every 8 hours 06/12/17 1415 06/07/2017 0936   06/09/17 2200  vancomycin (VANCOCIN) IVPB 750 mg/150 ml premix  Status:  Discontinued     750 mg 150 mL/hr over 60 Minutes Intravenous Every 12 hours 06/09/17 1016 06/12/17 1244   06/09/17 1400  piperacillin-tazobactam (ZOSYN) IVPB 3.375 g  Status:  Discontinued     3.375 g 100 mL/hr over 30 Minutes Intravenous Every 8 hours 06/09/17 1006 06/09/17 1007   06/09/17 1100  piperacillin-tazobactam (ZOSYN) IVPB 3.375 g  Status:  Discontinued     3.375 g 12.5 mL/hr over 240 Minutes Intravenous Every 8 hours 06/09/17 1007 06/13/17 1555   06/09/17 1030  vancomycin (VANCOCIN) 1,500 mg in sodium chloride 0.9 % 500 mL IVPB     1,500 mg 250 mL/hr over 120 Minutes Intravenous  Once 06/09/17 1016 06/09/17 1409   05/30/2017 1800  clindamycin (CLEOCIN) IVPB 600 mg  Status:  Discontinued     600 mg 100 mL/hr over 30 Minutes Intravenous Every 8 hours 05/28/2017 1658 06/09/17 1006   05/26/2017 1730  Ampicillin-Sulbactam (UNASYN) 3 g in sodium chloride 0.9 % 100 mL IVPB  Status:  Discontinued     3 g 200 mL/hr over 30 Minutes Intravenous Every 6 hours 06/01/2017 1658 06/09/17 1006   06/10/2017 0945  clindamycin (CLEOCIN) IVPB 600 mg     600  mg 100 mL/hr over 30 Minutes Intravenous  Once 05/29/2017 0933 06/13/2017 1019      Medications: Scheduled Meds: . chlorhexidine gluconate (MEDLINE KIT)  15 mL Mouth Rinse BID  . Chlorhexidine Gluconate Cloth  6 each Topical Daily  . feeding supplement (PRO-STAT SUGAR FREE 64)  30 mL Per Tube TID  . fentaNYL (SUBLIMAZE) injection  50 mcg Intravenous Once  . insulin aspart  2-6 Units Subcutaneous Q4H  . insulin aspart  4 Units Subcutaneous Q4H  . insulin glargine  15 Units Subcutaneous Daily  . mouth rinse  15 mL Mouth Rinse 10 times per day  . mupirocin ointment   Topical BID  . pantoprazole sodium  40 mg Per Tube Daily  . sodium chloride flush  10-40 mL Intracatheter Q12H   Continuous Infusions: . sodium chloride Stopped (05/30/2017 1500)  . sodium chloride 50 mL/hr at 06/16/17 0733  . ampicillin-sulbactam (UNASYN) IV Stopped (06/16/17 0654)  . clindamycin (CLEOCIN) IV Stopped (06/16/17 0914)  . feeding supplement (GLUCERNA 1.2 CAL) Stopped (06/16/17 0759)  . fentaNYL infusion INTRAVENOUS 100 mcg/hr (06/15/17 1900)  . lactated ringers Stopped (06/16/2017 0836)  . propofol (DIPRIVAN) infusion 30 mcg/kg/min (06/16/17 0645)   PRN Meds:.sodium chloride, acetaminophen (TYLENOL) oral liquid  160 mg/5 mL, acetaminophen, fentaNYL, sodium chloride flush    Objective: Weight change: 8 lb 2.5 oz (3.7 kg)  Intake/Output Summary (Last 24 hours) at 06/16/17 0851 Last data filed at 06/16/17 0845  Gross per 24 hour  Intake          5369.24 ml  Output             1890 ml  Net          3479.24 ml   Blood pressure 140/72, pulse 90, temperature (!) 100.9 F (38.3 C), temperature source Core (Comment), resp. rate 13, height 5' (1.524 m), weight 166 lb 7.2 oz (75.5 kg), SpO2 96 %. Temp:  [100.1 F (37.8 C)-103.6 F (39.8 C)] 100.9 F (38.3 C) (06/30 0845) Pulse Rate:  [76-104] 90 (06/30 0845) Resp:  [11-28] 13 (06/30 0845) BP: (88-151)/(50-92) 140/72 (06/30 0845) SpO2:  [96 %-100 %] 96 % (06/30  0845) FiO2 (%):  [30 %-40 %] 30 % (06/30 0756) Weight:  [166 lb 7.2 oz (75.5 kg)] 166 lb 7.2 oz (75.5 kg) (06/30 0421)  Physical Exam: General: sedated. On the ventilator HEENT: Bandage over much of face some blood around the endotracheal tube CVS tachycardic,  Chest: Coarse breath sounds Abdomen: soft nontender, nondistended, normal bowel sounds, Extremities: no  clubbing or edema noted bilaterally Skin: no rashes Neuro: nonfocal  CBC: CBC Latest Ref Rng & Units 06/16/2017 06/15/2017 06/15/2017  WBC 4.0 - 10.5 K/uL 21.0(H) 28.0(H) 38.1(H)  Hemoglobin 13.0 - 17.0 g/dL 6.3(LL) 5.5(LL) 7.5(L)  Hematocrit 39.0 - 52.0 % 21.1(L) 19.2(L) 26.3(L)  Platelets 150 - 400 K/uL 131(L) 213 197      BMET  Recent Labs  06/05/2017 0914 06/15/17 0330  NA 137 133*  K 3.7 3.9  CL 102 98*  CO2 32 30  GLUCOSE 60* 197*  BUN 13 10  CREATININE 0.71 0.72  CALCIUM 7.4* 7.1*     Liver Panel   Recent Labs  06/16/17 0407  PROT 4.9*  ALBUMIN <1.0*  AST 68*  ALT 31  ALKPHOS 104  BILITOT 2.5*  BILIDIR 1.5*  IBILI 1.0*       Sedimentation Rate No results for input(s): ESRSEDRATE in the last 72 hours. C-Reactive Protein No results for input(s): CRP in the last 72 hours.  Micro Results: Recent Results (from the past 720 hour(s))  Blood Culture (routine x 2)     Status: None   Collection Time: 05/26/2017  9:38 AM  Result Value Ref Range Status   Specimen Description BLOOD RIGHT HAND  Final   Special Requests   Final    BOTTLES DRAWN AEROBIC AND ANAEROBIC Blood Culture adequate volume   Culture NO GROWTH 5 DAYS  Final   Report Status 06/09/2017 FINAL  Final  Blood Culture (routine x 2)     Status: None   Collection Time: 06/01/2017  9:39 AM  Result Value Ref Range Status   Specimen Description BLOOD LEFT ANTECUBITAL  Final   Special Requests   Final    BOTTLES DRAWN AEROBIC AND ANAEROBIC Blood Culture adequate volume   Culture NO GROWTH 5 DAYS  Final   Report Status 06/09/2017 FINAL   Final  MRSA PCR Screening     Status: None   Collection Time: 05/25/2017  6:01 PM  Result Value Ref Range Status   MRSA by PCR NEGATIVE NEGATIVE Final    Comment:        The GeneXpert MRSA Assay (FDA approved for NASAL specimens only), is one  component of a comprehensive MRSA colonization surveillance program. It is not intended to diagnose MRSA infection nor to guide or monitor treatment for MRSA infections.   Surgical pcr screen     Status: None   Collection Time: 06/08/2017 10:22 AM  Result Value Ref Range Status   MRSA, PCR NEGATIVE NEGATIVE Final   Staphylococcus aureus NEGATIVE NEGATIVE Final    Comment:        The Xpert SA Assay (FDA approved for NASAL specimens in patients over 64 years of age), is one component of a comprehensive surveillance program.  Test performance has been validated by [facility], [facility] for patients greater than or equal to 21 year old. It is not intended to diagnose infection nor to guide or monitor treatment.   Aerobic/Anaerobic Culture (surgical/deep wound)     Status: None (Preliminary result)   Collection Time: 05/27/2017  6:18 PM  Result Value Ref Range Status   Specimen Description ABSCESS RIGHT NECK  Final   Special Requests NONE  Final   Gram Stain   Final    MODERATE WBC PRESENT, PREDOMINANTLY PMN MODERATE GRAM NEGATIVE RODS    Culture   Final    MODERATE KLEBSIELLA PNEUMONIAE NO ANAEROBES ISOLATED; CULTURE IN PROGRESS FOR 5 DAYS    Report Status PENDING  Incomplete   Organism ID, Bacteria KLEBSIELLA PNEUMONIAE  Final      Susceptibility   Klebsiella pneumoniae - MIC*    AMPICILLIN >=32 RESISTANT Resistant     CEFAZOLIN <=4 SENSITIVE Sensitive     CEFEPIME <=1 SENSITIVE Sensitive     CEFTAZIDIME <=1 SENSITIVE Sensitive     CEFTRIAXONE <=1 SENSITIVE Sensitive     CIPROFLOXACIN <=0.25 SENSITIVE Sensitive     GENTAMICIN <=1 SENSITIVE Sensitive     IMIPENEM <=0.25 SENSITIVE Sensitive     TRIMETH/SULFA <=20 SENSITIVE Sensitive      AMPICILLIN/SULBACTAM 8 SENSITIVE Sensitive     PIP/TAZO <=4 SENSITIVE Sensitive     Extended ESBL NEGATIVE Sensitive     * MODERATE KLEBSIELLA PNEUMONIAE  MRSA PCR Screening     Status: None   Collection Time: 06/02/2017  8:30 PM  Result Value Ref Range Status   MRSA by PCR NEGATIVE NEGATIVE Final    Comment:        The GeneXpert MRSA Assay (FDA approved for NASAL specimens only), is one component of a comprehensive MRSA colonization surveillance program. It is not intended to diagnose MRSA infection nor to guide or monitor treatment for MRSA infections.   C difficile quick scan w PCR reflex     Status: None   Collection Time: 06/13/17  9:55 AM  Result Value Ref Range Status   C Diff antigen NEGATIVE NEGATIVE Final   C Diff toxin NEGATIVE NEGATIVE Final   C Diff interpretation No C. difficile detected.  Final  Culture, blood (Routine X 2) w Reflex to ID Panel     Status: None (Preliminary result)   Collection Time: 05/28/2017  3:15 PM  Result Value Ref Range Status   Specimen Description BLOOD LEFT ANTECUBITAL  Final   Special Requests   Final    BOTTLES DRAWN AEROBIC AND ANAEROBIC Blood Culture adequate volume   Culture NO GROWTH 1 DAY  Final   Report Status PENDING  Incomplete  Culture, blood (Routine X 2) w Reflex to ID Panel     Status: None (Preliminary result)   Collection Time: 06/12/2017  3:15 PM  Result Value Ref Range Status   Specimen Description BLOOD BLOOD  LEFT HAND  Final   Special Requests   Final    BOTTLES DRAWN AEROBIC AND ANAEROBIC Blood Culture adequate volume   Culture NO GROWTH 1 DAY  Final   Report Status PENDING  Incomplete    Studies/Results: Ct Soft Tissue Neck W Contrast  Result Date: 05/29/2017 CLINICAL DATA:  61 y/o M; right facial and right neck swelling. Parotiditis. Evaluate for abscess. EXAM: CT NECK WITH CONTRAST TECHNIQUE: Multidetector CT imaging of the neck was performed using the standard protocol following the bolus administration of  intravenous contrast. CONTRAST:  74m ISOVUE-300 IOPAMIDOL (ISOVUE-300) INJECTION 61% COMPARISON:  06/01/2017 CT of the head and neck. FINDINGS: Pharynx and larynx: The complete effacement of the airway due to inflammatory change of pharyngeal mucosa, right parapharyngeal abscess, and debris in the airways. Salivary glands: Diffuse inflammatory change of right parotid and submandibular glands with separation. Thyroid: Normal. Lymph nodes: Reactive cervical lymphadenopathy. No lymph node abscess. Vascular: Mass effect on the right internal jugular vein at the C2 level without occlusion. Carotid and vertebral arteries of the neck are patent. Internal jugular veins are patent. Limited intracranial: Negative. Visualized orbits: No intraorbital inflammatory changes at this time. Mastoids and visualized paranasal sinuses: Worsening paranasal sinus disease with near complete opacification of ethmoid air cells and multiple air-fluid levels. Increasing effusions within the mastoid air cells. Skeleton: Numerous dental caries compatible with odontogenic disease. No large dental abscess is identified. Upper chest: Tracheostomy in situ. Small right greater than left pleural effusions. Other: Interval incision and drainage of right periorbital, right facial subcutaneous, and right paramandibular fluid collections. There are multiple drains in situ. Right facial subcutaneous, right periorbital, and right perimandibular soft tissue fluid collections are much decreased in size post debridement. Extensive inflammation, phlegmon, and multiple abscesses are present involving right temporal fossa, right periorbital soft tissues, subcutaneous fat of the right face, right masticator space, right parotid space, right carotid space, right parapharyngeal space, right greater left submandibular spaces, and right greater than left sublingual spaces. Addition, there is extensive inflammatory change of pharyngeal mucosa with complete effacement  of the airway. There are extensive residual phlegmonous changes within the right masticator compartment. Stable 34 x 15 mm abscess in the right sublingual space and right submandibular gland (series 3, image 78). Grossly stable right parapharyngeal abscess measuring 30 x 22 mm axially at approximately 60 mm craniocaudal from skullbase to hyoid bone (series 3, image 61 and series 6, image 60) Retropharyngeal fluid is present from the adenoid tonsils to the C6 level. No definite evidence for upper mediastinitis. Not included in the field of view of the prior study is a abscess within the right temporalis muscle within the right lateral scalp measuring up to 12 mm in thickness. IMPRESSION: 1. Interval debridement of right periorbital, right facial subcutaneous, and right paramandibular fluid collections with multiple drains in situ. Debrided collections are much decreased in size in comparison with the prior study. 2. Stable large right parapharyngeal abscess measuring up to 6 mm craniocaudal. 3. Increased effacement of the pharyngeal airway, now complete, due to mass effect and debris. 4. Interval worsening of paranasal sinus and mastoid disease with fluid levels probably due to poor drainage due to airway effacement. 5. Large abscess within right temporalis muscle in the right lateral scalp, not included within the field of view on prior study. 6. Stable abscess within the right sublingual space and right submandibular gland. 7. Stable extensive suppurative change within right parotid and submandibular glands. Electronically Signed   By: LMia Creek  Furus[place]-Stratton M.D.   On: 05/28/2017 22:21   Dg Chest Port 1 View  Result Date: 06/15/2017 CLINICAL DATA:  Tracheostomy tube . EXAM: PORTABLE CHEST 1 VIEW COMPARISON:  06/03/2017 . FINDINGS: Tracheostomy tube and right PICC line stable position. Feeding tube is been advanced, its tip is below the left hemidiaphragm. Heart size stable. Low lung volumes. No focal  infiltrate. Tiny left pleural effusion cannot be excluded. No pneumothorax. IMPRESSION: 1. Interim advancement of feeding tube, its tip is below the left hemidiaphragm. Tracheostomy tube and right PICC line stable position. 2. Low lung volumes.  Tiny left pleural effusion cannot be excluded. Electronically Signed   By: Marcello Moores  Register   On: 06/15/2017 06:43   Dg Abd Portable 1v  Result Date: 06/15/2017 CLINICAL DATA:  NG tube placement EXAM: PORTABLE ABDOMEN - 1 VIEW COMPARISON:  05/21/2017 FINDINGS: Esophageal tube tip overlies the distal stomach. Abdomen is gasless. IMPRESSION: Esophageal tube tip overlies the distal stomach Electronically Signed   By: Donavan Foil M.D.   On: 06/15/2017 00:42   Dg Abd Portable 1v  Result Date: 05/26/2017 CLINICAL DATA:  Feeding tube advanced. EXAM: PORTABLE ABDOMEN - 1 VIEW COMPARISON:  06/13/2017 FINDINGS: Soft feeding tube tip has advanced about 5 cm, now overlying the body of the stomach. The majority of the tube advancement has resulted in coli rolling in the distal esophagus. Gas pattern remains unremarkable. IMPRESSION: Soft feeding tube tip advanced slightly into the body of the stomach. Most of the tube advancement has resulted in coiling in the distal esophagus. Electronically Signed   By: Nelson Chimes M.D.   On: 05/28/2017 10:16      Assessment/Plan:  INTERVAL HISTORY: pt WBC trending down still on cooling blanket   Active Problems:   Parotitis, acute   Facial cellulitis   Acute respiratory failure ([place])   Acute chest pain    [name] [name] is a 61 y.o. male with  Deep tissue, neck abscess/parapharyngeal abscess with Kleb pNA on cultures.n  I would agree that clinically this would be more typically a scenario where we would suspect a strep infection or staph infection.  #1 Neck abscess or pharyngeal abscess: Agree with repeat surgery and resending cultures --continue unasyn and I would like to get rid of clindamycin soon, likely tomorrow      LOS: 12 days   Alcide Evener 06/16/2017, 8:51 AM

## 2017-06-16 NOTE — Progress Notes (Signed)
   Recent Labs Lab 06/15/17 0330 06/15/17 2008 06/16/17 0407  HGB 7.5* 5.5* 6.3*  HCT 26.3* 19.2* 21.1*  WBC 38.1* 28.0* PENDING  PLT 197 213 PENDING    Anemia of critical illness  Plan 1 unit prbc (no contra indication per RN)   Dr. Kalman ShanMurali Sophiamarie Nease, M.D., [facility] HospitalF.C.C.P Pulmonary and Critical Care Medicine Staff Physician [facility] [place] [facility] Pager: [phone], If no answer or between  15:00h - 7:00h: call [phone]  06/16/2017 5:55 AM

## 2017-06-17 ENCOUNTER — Encounter (HOSPITAL_COMMUNITY): Admission: EM | Disposition: E | Payer: Self-pay | Source: Home / Self Care | Attending: Pulmonary Disease

## 2017-06-17 ENCOUNTER — Inpatient Hospital Stay (HOSPITAL_COMMUNITY): Payer: 59 | Admitting: Anesthesiology

## 2017-06-17 ENCOUNTER — Inpatient Hospital Stay (HOSPITAL_COMMUNITY): Payer: 59

## 2017-06-17 ENCOUNTER — Other Ambulatory Visit: Payer: Self-pay

## 2017-06-17 DIAGNOSIS — Z9889 Other specified postprocedural states: Secondary | ICD-10-CM

## 2017-06-17 DIAGNOSIS — M726 Necrotizing fasciitis: Secondary | ICD-10-CM

## 2017-06-17 DIAGNOSIS — Z1611 Resistance to penicillins: Secondary | ICD-10-CM

## 2017-06-17 HISTORY — PX: WOUND EXPLORATION: SHX6188

## 2017-06-17 LAB — COMPREHENSIVE METABOLIC PANEL
ALT: 21 U/L (ref 17–63)
ANION GAP: 7 (ref 5–15)
AST: 53 U/L — ABNORMAL HIGH (ref 15–41)
Alkaline Phosphatase: 108 U/L (ref 38–126)
BILIRUBIN TOTAL: 2.4 mg/dL — AB (ref 0.3–1.2)
BUN: 19 mg/dL (ref 6–20)
CO2: 26 mmol/L (ref 22–32)
Calcium: 7.1 mg/dL — ABNORMAL LOW (ref 8.9–10.3)
Chloride: 109 mmol/L (ref 101–111)
Creatinine, Ser: 0.96 mg/dL (ref 0.61–1.24)
GFR calc Af Amer: >60 mL/min (ref >60)
GFR calc non Af Amer: >60 mL/min (ref >60)
GLUCOSE: 199 mg/dL — AB (ref 65–99)
POTASSIUM: 3.9 mmol/L (ref 3.5–5.1)
SODIUM: 142 mmol/L (ref 135–145)
TOTAL PROTEIN: 4.6 g/dL — AB (ref 6.5–8.1)

## 2017-06-17 LAB — GLUCOSE, CAPILLARY
GLUCOSE-CAPILLARY: 131 mg/dL — AB (ref 65–99)
Glucose-Capillary: 131 mg/dL — ABNORMAL HIGH (ref 65–99)
Glucose-Capillary: 142 mg/dL — ABNORMAL HIGH (ref 65–99)
Glucose-Capillary: 148 mg/dL — ABNORMAL HIGH (ref 65–99)
Glucose-Capillary: 176 mg/dL — ABNORMAL HIGH (ref 65–99)

## 2017-06-17 LAB — CBC WITH DIFFERENTIAL/PLATELET
Basophils Absolute: 0 10*3/uL (ref 0.0–0.1)
Basophils Relative: 0 %
EOS ABS: 0 10*3/uL (ref 0.0–0.7)
Eosinophils Relative: 0 %
HEMATOCRIT: 26.5 % — AB (ref 39.0–52.0)
Hemoglobin: 8.6 g/dL — ABNORMAL LOW (ref 13.0–17.0)
Lymphocytes Relative: 0 %
Lymphs Abs: 0 10*3/uL — ABNORMAL LOW (ref 0.7–4.0)
MCH: 26.1 pg (ref 26.0–34.0)
MCHC: 32.5 g/dL (ref 30.0–36.0)
MCV: 80.3 fL (ref 78.0–100.0)
MONO ABS: 1 10*3/uL (ref 0.1–1.0)
Monocytes Relative: 3 %
NEUTROS PCT: 97 %
Neutro Abs: 31.6 10*3/uL — ABNORMAL HIGH (ref 1.7–7.7)
Platelets: 163 10*3/uL (ref 150–400)
RBC: 3.3 MIL/uL — AB (ref 4.22–5.81)
RDW: 15 % (ref 11.5–15.5)
WBC: 32.6 10*3/uL — AB (ref 4.0–10.5)

## 2017-06-17 LAB — MAGNESIUM: MAGNESIUM: 2.1 mg/dL (ref 1.7–2.4)

## 2017-06-17 LAB — CBC
HCT: 34.2 % — ABNORMAL LOW (ref 39.0–52.0)
Hemoglobin: 11.5 g/dL — ABNORMAL LOW (ref 13.0–17.0)
MCH: 28.3 pg (ref 26.0–34.0)
MCHC: 33.6 g/dL (ref 30.0–36.0)
MCV: 84 fL (ref 78.0–100.0)
PLATELETS: 122 10*3/uL — AB (ref 150–400)
RBC: 4.07 MIL/uL — ABNORMAL LOW (ref 4.22–5.81)
RDW: 15.9 % — AB (ref 11.5–15.5)
WBC: 31.6 10*3/uL — ABNORMAL HIGH (ref 4.0–10.5)

## 2017-06-17 LAB — POCT I-STAT 4, (NA,K, GLUC, HGB,HCT)
Glucose, Bld: 137 mg/dL — ABNORMAL HIGH (ref 65–99)
HEMATOCRIT: 27 % — AB (ref 39.0–52.0)
Hemoglobin: 9.2 g/dL — ABNORMAL LOW (ref 13.0–17.0)
Potassium: 4.3 mmol/L (ref 3.5–5.1)
Sodium: 147 mmol/L — ABNORMAL HIGH (ref 135–145)

## 2017-06-17 LAB — TRIGLYCERIDES: Triglycerides: 273 mg/dL — ABNORMAL HIGH (ref <150)

## 2017-06-17 SURGERY — WOUND EXPLORATION
Anesthesia: General | Site: Head | Laterality: Bilateral

## 2017-06-17 MED ORDER — LACTATED RINGERS IV SOLN
INTRAVENOUS | Status: DC | PRN
  Administered 2017-06-17: 08:00:00 via INTRAVENOUS

## 2017-06-17 MED ORDER — PHENYLEPHRINE HCL 10 MG/ML IJ SOLN
INTRAVENOUS | Status: DC | PRN
  Administered 2017-06-17: 50 ug/min via INTRAVENOUS

## 2017-06-17 MED ORDER — PHENYLEPHRINE 40 MCG/ML (10ML) SYRINGE FOR IV PUSH (FOR BLOOD PRESSURE SUPPORT)
PREFILLED_SYRINGE | INTRAVENOUS | Status: AC
  Filled 2017-06-17: qty 10

## 2017-06-17 MED ORDER — AMIODARONE HCL IN DEXTROSE 360-4.14 MG/200ML-% IV SOLN
30.0000 mg/h | INTRAVENOUS | Status: DC
  Administered 2017-06-17 – 2017-06-22 (×9): 30 mg/h via INTRAVENOUS
  Filled 2017-06-17 (×24): qty 200

## 2017-06-17 MED ORDER — LABETALOL HCL 5 MG/ML IV SOLN: 10.0000 mg | Freq: Once | INTRAVENOUS | Status: DC

## 2017-06-17 MED ORDER — METOPROLOL TARTRATE 5 MG/5ML IV SOLN
2.5000 mg | INTRAVENOUS | Status: DC | PRN
  Administered 2017-06-17 – 2017-06-19 (×2): 5 mg via INTRAVENOUS
  Filled 2017-06-17: qty 5

## 2017-06-17 MED ORDER — ROCURONIUM BROMIDE 100 MG/10ML IV SOLN
INTRAVENOUS | Status: DC | PRN
  Administered 2017-06-17 (×2): 50 mg via INTRAVENOUS

## 2017-06-17 MED ORDER — LIDOCAINE-EPINEPHRINE 1 %-1:100000 IJ SOLN
INTRAMUSCULAR | Status: DC | PRN
  Administered 2017-06-17: 4.5 mL

## 2017-06-17 MED ORDER — 0.9 % SODIUM CHLORIDE (POUR BTL) OPTIME
TOPICAL | Status: DC | PRN
  Administered 2017-06-17: 1000 mL

## 2017-06-17 MED ORDER — PHENYLEPHRINE 40 MCG/ML (10ML) SYRINGE FOR IV PUSH (FOR BLOOD PRESSURE SUPPORT)
PREFILLED_SYRINGE | INTRAVENOUS | Status: DC | PRN
  Administered 2017-06-17: 120 ug via INTRAVENOUS

## 2017-06-17 MED ORDER — PROPOFOL 10 MG/ML IV BOLUS
INTRAVENOUS | Status: AC
  Filled 2017-06-17: qty 20

## 2017-06-17 MED ORDER — EPHEDRINE 5 MG/ML INJ
INTRAVENOUS | Status: AC
  Filled 2017-06-17: qty 10

## 2017-06-17 MED ORDER — FENTANYL CITRATE (PF) 250 MCG/5ML IJ SOLN
INTRAMUSCULAR | Status: AC
  Filled 2017-06-17: qty 5

## 2017-06-17 MED ORDER — LIDOCAINE 2% (20 MG/ML) 5 ML SYRINGE
INTRAMUSCULAR | Status: AC
  Filled 2017-06-17: qty 5

## 2017-06-17 MED ORDER — IMMUNE GLOBULIN (HUMAN) 20 GM/200ML IV SOLN
2.0000 g/kg | INTRAVENOUS | Status: AC
  Administered 2017-06-17: 150 g via INTRAVENOUS
  Filled 2017-06-17: qty 100

## 2017-06-17 MED ORDER — AMIODARONE LOAD VIA INFUSION
150.0000 mg | Freq: Once | INTRAVENOUS | Status: AC
Start: 2017-06-17 — End: 2017-06-17
  Administered 2017-06-17: 150 mg via INTRAVENOUS
  Filled 2017-06-17: qty 83.34

## 2017-06-17 MED ORDER — AMIODARONE HCL IN DEXTROSE 360-4.14 MG/200ML-% IV SOLN
60.0000 mg/h | INTRAVENOUS | Status: DC
  Administered 2017-06-17 (×2): 60 mg/h via INTRAVENOUS

## 2017-06-17 MED ORDER — EPHEDRINE SULFATE-NACL 50-0.9 MG/10ML-% IV SOSY
PREFILLED_SYRINGE | INTRAVENOUS | Status: DC | PRN
  Administered 2017-06-17: 10 mg via INTRAVENOUS

## 2017-06-17 MED ORDER — METOPROLOL TARTRATE 5 MG/5ML IV SOLN
INTRAVENOUS | Status: AC
  Filled 2017-06-17: qty 5

## 2017-06-17 MED ORDER — MIDAZOLAM HCL 2 MG/2ML IJ SOLN
INTRAMUSCULAR | Status: AC
  Filled 2017-06-17: qty 2

## 2017-06-17 MED ORDER — ARTIFICIAL TEARS OPHTHALMIC OINT
TOPICAL_OINTMENT | OPHTHALMIC | Status: DC | PRN
  Administered 2017-06-17: 1 via OPHTHALMIC

## 2017-06-17 MED ORDER — AMIODARONE HCL IN DEXTROSE 360-4.14 MG/200ML-% IV SOLN
INTRAVENOUS | Status: AC
  Filled 2017-06-17: qty 200

## 2017-06-17 MED ORDER — CLINDAMYCIN PHOSPHATE 900 MG/50ML IV SOLN
900.0000 mg | Freq: Three times a day (TID) | INTRAVENOUS | Status: DC
  Administered 2017-06-17 – 2017-06-19 (×6): 900 mg via INTRAVENOUS
  Filled 2017-06-17 (×7): qty 50

## 2017-06-17 MED ORDER — LIDOCAINE-EPINEPHRINE 1 %-1:100000 IJ SOLN
INTRAMUSCULAR | Status: AC
  Filled 2017-06-17: qty 1

## 2017-06-17 MED ORDER — SODIUM CHLORIDE 0.9 % IV SOLN
1.0000 g | Freq: Three times a day (TID) | INTRAVENOUS | Status: DC
  Administered 2017-06-17 – 2017-06-19 (×6): 1 g via INTRAVENOUS
  Filled 2017-06-17 (×7): qty 1

## 2017-06-17 MED ORDER — ROCURONIUM BROMIDE 10 MG/ML (PF) SYRINGE
PREFILLED_SYRINGE | INTRAVENOUS | Status: AC
  Filled 2017-06-17: qty 5

## 2017-06-17 MED ORDER — ARTIFICIAL TEARS OPHTHALMIC OINT
TOPICAL_OINTMENT | OPHTHALMIC | Status: AC
  Filled 2017-06-17: qty 3.5

## 2017-06-17 MED ORDER — SUCCINYLCHOLINE CHLORIDE 200 MG/10ML IV SOSY
PREFILLED_SYRINGE | INTRAVENOUS | Status: AC
  Filled 2017-06-17: qty 10

## 2017-06-17 SURGICAL SUPPLY — 47 items
AIRSTRIP 4 3/4X3 1/4 7185 (GAUZE/BANDAGES/DRESSINGS) IMPLANT
BLADE SURG 15 STRL LF DISP TIS (BLADE) IMPLANT
BLADE SURG 15 STRL SS (BLADE)
BNDG GAUZE ELAST 4 BULKY (GAUZE/BANDAGES/DRESSINGS) ×3 IMPLANT
CANISTER SUCT 3000ML PPV (MISCELLANEOUS) IMPLANT
CLEANER TIP ELECTROSURG 2X2 (MISCELLANEOUS) ×3 IMPLANT
CONT SPEC 4OZ CLIKSEAL STRL BL (MISCELLANEOUS) ×3 IMPLANT
COVER SURGICAL LIGHT HANDLE (MISCELLANEOUS) ×3 IMPLANT
CRADLE DONUT ADULT HEAD (MISCELLANEOUS) IMPLANT
DECANTER SPIKE VIAL GLASS SM (MISCELLANEOUS) ×3 IMPLANT
DRAIN PENROSE 1/4X12 LTX STRL (WOUND CARE) IMPLANT
DRAPE HALF SHEET 40X57 (DRAPES) IMPLANT
DRSG EMULSION OIL 3X3 NADH (GAUZE/BANDAGES/DRESSINGS) IMPLANT
ELECT COATED BLADE 2.86 ST (ELECTRODE) ×3 IMPLANT
ELECT REM PT RETURN 9FT ADLT (ELECTROSURGICAL) ×3
ELECTRODE REM PT RTRN 9FT ADLT (ELECTROSURGICAL) ×1 IMPLANT
GAUZE SPONGE 4X4 12PLY STRL (GAUZE/BANDAGES/DRESSINGS) IMPLANT
GAUZE SPONGE 4X4 16PLY XRAY LF (GAUZE/BANDAGES/DRESSINGS) ×6 IMPLANT
GLOVE ECLIPSE 8.0 STRL XLNG CF (GLOVE) ×3 IMPLANT
GOWN STRL REUS W/ TWL LRG LVL3 (GOWN DISPOSABLE) ×1 IMPLANT
GOWN STRL REUS W/ TWL XL LVL3 (GOWN DISPOSABLE) ×1 IMPLANT
GOWN STRL REUS W/TWL LRG LVL3 (GOWN DISPOSABLE) ×2
GOWN STRL REUS W/TWL XL LVL3 (GOWN DISPOSABLE) ×2
KIT BASIN OR (CUSTOM PROCEDURE TRAY) ×3 IMPLANT
KIT ROOM TURNOVER OR (KITS) ×3 IMPLANT
LOCATOR NERVE 3 VOLT (DISPOSABLE) IMPLANT
NEEDLE FILTER BLUNT 18X 1/2SAF (NEEDLE)
NEEDLE FILTER BLUNT 18X1 1/2 (NEEDLE) IMPLANT
NEEDLE HYPO 25GX1X1/2 BEV (NEEDLE) ×3 IMPLANT
NS IRRIG 1000ML POUR BTL (IV SOLUTION) ×3 IMPLANT
PAD ARMBOARD 7.5X6 YLW CONV (MISCELLANEOUS) ×6 IMPLANT
PENCIL BUTTON HOLSTER BLD 10FT (ELECTRODE) ×6 IMPLANT
RUBBERBAND STERILE (MISCELLANEOUS) IMPLANT
STAPLER VISISTAT 35W (STAPLE) IMPLANT
SUT CHROMIC 3 0 PS 2 (SUTURE) IMPLANT
SUT CHROMIC 4 0 P 3 18 (SUTURE) IMPLANT
SUT ETHILON 6 0 P 1 (SUTURE) IMPLANT
SUT SILK 3 0 (SUTURE) ×4
SUT SILK 3-0 18XBRD TIE 12 (SUTURE) ×2 IMPLANT
SWAB COLLECTION DEVICE MRSA (MISCELLANEOUS) IMPLANT
SWAB CULTURE ESWAB REG 1ML (MISCELLANEOUS) IMPLANT
SYR CONTROL 10ML LL (SYRINGE) ×3 IMPLANT
SYR TB 1ML LUER SLIP (SYRINGE) IMPLANT
TAPE CLOTH SURG 4X10 WHT LF (GAUZE/BANDAGES/DRESSINGS) ×3 IMPLANT
TOWEL OR 17X24 6PK STRL BLUE (TOWEL DISPOSABLE) ×3 IMPLANT
TRAY ENT MC OR (CUSTOM PROCEDURE TRAY) ×3 IMPLANT
WATER STERILE IRR 1000ML POUR (IV SOLUTION) ×3 IMPLANT

## 2017-06-17 NOTE — Progress Notes (Signed)
PULMONARY / CRITICAL CARE MEDICINE   Name: [name] FranklinHlec Claar MRN: [id number] DOB: 1956/10/24    ADMISSION DATE:  06/15/2017 CONSULTATION DATE:  05/29/2017  REFERRING MD:  Dr. Sharon SellerMcClung  CHIEF COMPLAINT:  Right parapharyngeal and neck abscesses  HISTORY OF PRESENT ILLNESS:  61 neck abscess, I and D, trach, vent   SUBJECTIVE:  Bleeding amio for fib rvr now in SR Borderline BP  VITAL SIGNS: BP (!) 88/58   Pulse 94   Temp (!) 101.5 F (38.6 C) (Core (Comment))   Resp 20   Ht 5' (1.524 m)   Wt 75.2 kg (165 lb 12.6 oz)   SpO2 95%   BMI 32.38 kg/m   HEMODYNAMICS:    VENTILATOR SETTINGS: Vent Mode: PRVC FiO2 (%):  [30 %] 30 % Set Rate:  [16 bmp] 16 bmp Vt Set:  [400 mL] 400 mL PEEP:  [5 cmH20] 5 cmH20 Plateau Pressure:  [9 cmH20-16 cmH20] 11 cmH20  INTAKE / OUTPUT: I/O last 3 completed shifts: In: 6001.1 [I.V.:2652.7; Blood:1527.2; NG/GT:1121.3; IV Piggyback:700] Out: 3680 [Urine:3405; Stool:275]  PHYSICAL EXAMINATION: General: not awake, dressed face Neuro: rass -3 HEENT: dressing, wet blood PULM: coarse, NO crackles, no wheezing CV: s1 s2 RRR now in SR GI: soft, bs wnl, no r Extremities:  Min edema     LABS:  BMET  Recent Labs Lab 06/01/2017 0914 06/15/17 0330 07/17/2017 0424  NA 137 133* 142  K 3.7 3.9 3.9  CL 102 98* 109  CO2 32 30 26  BUN 13 10 19   CREATININE 0.71 0.72 0.96  GLUCOSE 60* 197* 199*    Electrolytes  Recent Labs Lab 06/12/17 0458 06/13/17 0415 05/24/2017 0914 06/15/17 0330 06/23/2017 0424 07/03/2017 0436  CALCIUM 7.6* 7.5* 7.4* 7.1* 7.1*  --   MG 1.8 1.9  --  1.7  --  2.1  PHOS 2.4* 3.6  --  4.2  --   --     CBC  Recent Labs Lab 06/16/17 1130 06/16/17 1744 07/06/2017 0424  WBC 31.0* 29.6* 32.6*  HGB 7.8* 6.7* 8.6*  HCT 24.8* 21.4* 26.5*  PLT 180 160 163    Coag's  Recent Labs Lab 06/15/17 1125 06/16/17 0407  APTT 53*  --   INR 1.40 1.30    Sepsis Markers  Recent Labs Lab 06/12/17 2324 06/13/17 1230   LATICACIDVEN 2.9* 2.2*    ABG  Recent Labs Lab 06/12/17 0500 05/18/2017 0849  PHART 7.482* 7.392  PCO2ART 35.9 57.4*  PO2ART 111* 111.0*    Liver Enzymes  Recent Labs Lab 05/28/2017 0148 06/16/17 0407 06/30/2017 0424  AST 57* 68* 53*  ALT 40 31 21  ALKPHOS 96 104 108  BILITOT 3.0* 2.5* 2.4*  ALBUMIN 1.8* <1.0* <1.0*    Cardiac Enzymes No results for input(s): TROPONINI, PROBNP in the last 168 hours.  Glucose  Recent Labs Lab 06/16/17 1139 06/16/17 1252 06/16/17 1620 06/16/17 1929 06/16/17 2309 07/09/2017 0437  GLUCAP 288* 198* 234* 269* 284* 176*   Imaging Ct Soft Tissue Neck W Contrast  Result Date: 06/15/2017 CLINICAL DATA:  Followup parotiditis. EXAM: CT NECK WITH CONTRAST TECHNIQUE: Multidetector CT imaging of the neck was performed using the standard protocol following the bolus administration of intravenous contrast. CONTRAST:  75 cc Isovue-300 COMPARISON:  06/08/2017, 06/13/2017 FINDINGS: There is further worsening by imaging. Patient appears to have diffuse suppurative inflammation of the right parotid and submandibular glands. Diffuse cellulitis/abscess formation throughout the right face and deep spaces with specific findings as below. Low-density fluid, presumably abscess,  superficial to the right parotid gland with thickness measuring up to 16 mm. Low-density fluid, presumably abscess, right periorbital preseptal region measuring up to 2 cm in thickness. More superficial subcutaneous fluid in the right cheek measuring up to 17 mm in thickness. Right parapharyngeal space abscess much larger, measuring 3.5 x 2.7 cm and extending over a length of almost 6 cm, with mass effect upon the oropharynx and hypopharynx. Abscess within the right submandibular gland extending into the sublingual region. No sign of intracranial extension in the limited intracranial images. No sign of postseptal orbital inflammation on the limited images. Diffuse mucosal inflammation of the  right maxillary sinus. No layering fluid. Some dental and periodontal disease, but without advanced periodontal disease/abscess of bone. IMPRESSION: Continued worsening of inflammatory disease of the right face and deep spaces. Most notable changes include enlargement of the right parapharyngeal abscess now measuring up to 3.5 cm in diameter and extending over a length of almost 6 cm with worsened mass effect upon the hypopharynx and pharynx. See above for description of other locations of involvement/extension. Electronically Signed   By: Paulina Fusi M.D.   On: 06/05/2017 09:17    STUDIES:  CT Neck 6/18 > Diffuse facial swelling favored to represent bacterial RIGHT parotitis. Significant facial cellulitis, with RIGHT muscle of mastication enlargement. No visible calculi. No drainable abscess. Minor dental caries and periapical lucencies, not felt to be the source of inflammation. CXR 6/22 > Low lung volumes with right basilar atelectasis. Upper normal heart size likely exaggerated by technique and low lung volumes. Left lung base and costophrenic angle not included in the field of view. Frontal and lateral views may be helpful if patient is able. CT neck 6/22 > Extensive severe multi compartmental infectious/inflammatory process involving the right face from the right periorbital soft tissues to the upper neck. Inflammation involves the all right-sided deep cervical compartments including right parapharyngeal and retropharyngeal spaces. There multiple ill-defined fluid collections probably representing phlegmon. No discrete rim enhancing abscess is Identified. There is rightward displacement of the airway and moderate narrowing of the airway due to fluid collections in parapharyngeal space and swelling of oral and hypopharyngeal mucosa. The origin of infection is uncertain, possibly odontogenic given the presence of odontogenic disease. CT Neck 6/25 > Continued worsening of inflammatory disease of the  right face and deep spaces. Most notable changes include enlargement of the right parapharyngeal abscess now measuring up to 3.5 cm in diameter and extending over a length of almost 6 cm with worsened mass effect upon the hypopharynx and pharynx.  6/27 CxR with ft needing advancement. 6/28 CxR Pending>>  CULTURES: Blood 6/18 > No growth  MRSA PCR 6/18 > Negative  MRSA PCP 6/25 > Negative Staphy aureus 6/25 > Negative  Aerobic/Anareboic Culture 6/25 >> Kleb P. Res to ampicillin  6.28 BC>>>NGTD  ANTIBIOTICS: Per ID Clindamycin 6/18 > 6/23>>> Unasyn 6/18 > 6/22 Vancomycin 6/23 >>>6/28 unasyn 6/27>>> Zosyn 6/23 >>>6/27  SIGNIFICANT EVENTS: 6/18 > Presents to ED 6/25 > Taken to OR for I&D and Trach  6/29- fevers, vent 6/30- bleeding, blood, cauterized bedside 7/1 fib rvr, borderline bP, to OR  LINES/TUBES: Trach 6/25 >>  L femoral CVC 6/25 >> Aline ( in OR 7/1)>>>  DISCUSSION: 61 year old male presents to ED on 6/18 with right facial cellulitis/parotitis and parapharyngeal abscess s/p I&D and trach on 6/25. 6/28 lighten sedation and wean from vent as tolerated. Lantus stopped and D5w started as FT malpositioned and tube feeds stopped coupled  with stopping steroids has led to hypoglycemia.  ASSESSMENT / PLAN:  PULMONARY A: Respiratory Insufficieny in setting of facial cellulitis/focal abscess s/p trach  P:   No SBT Stat abg to ensure not acidosis and compensation pcxr in am for volume status  CARDIOVASCULAR A:  HTN hyponatremia resolved Bleeding Borderline bP Fib rvr now SR P:  Tele Lactic  repeat abg Bolus Neo if needed to map 60 cvp on picc  RENAL Lab Results  Component Value Date   CREATININE 0.96 06/30/2017   CREATININE 0.72 06/15/2017   CREATININE 0.71 06/11/2017   CREATININE 0.83 04/05/2015   CREATININE 0.76 07/28/2014   CREATININE 0.80 02/23/2014    Recent Labs Lab 06/11/2017 0914 06/15/17 0330 07/06/2017 0424  K 3.7 3.9 3.9     A:    hyponatremiam resolved, hypovolemia resolving P:   Saline bolus Chem in am   GASTROINTESTINAL A:   Hep C genotype 1 with splenomegaly - ? Untreated Protein energy malnutition P:   PPI Continue tube feeds, consider pivot , mechanical vent, ent pt LF in am are wnl  HEMATOLOGIC A:   Thrombocytopenia > improving  Anemia with active wound bleeding x 2 days P:  Follow CBC q12h scd  INFECTIOUS A:   Right Acute Parotitis with extensive facial cellulitis and focal right parapharyngeal abscess s/p I&D with Klebsiella P. Res to ampicillin Elevated WBC coung  necrtozing? P:   Maintain unasyn Keep clinda To OR again today  ENDOCRINE CBG (last 3)   Recent Labs  06/16/17 1929 06/16/17 2309 07/11/2017 0437  GLUCAP 269* 284* 176*     A:   DM , at risk poor wound helaing P:   SSI coverage to moderate lantus increase, hold if to OR and Tf stop TF coverage consider increase to 4  NEUROLOGIC A:   Acute Encephalopathy secondary to sedation  pain P:   RASS goal: -3 PRN fentanyl   NO WUA   FAMILY  - Inter-disciplinary family meet or Palliative Care meeting due by:  06/18/17  Ccm time 30 min  Mcarthur Rossetti. Tyson Alias, MD, FACP Pgr: [phone] [facility]

## 2017-06-17 NOTE — Op Note (Signed)
07/14/2017  11:16 AM    [name], [name]  [id number]   Pre-Op Dx:  Necrotizing fasciitis/deep neck infection, head and bilateral necks  Post-op Dx: Same  Proc: Multiple neck compartment exploration, debridement, and wound packing   Surg:  Flo ShanksWOLICKI, Zola Runion T MD  Anes:  General tracheostomal  EBL:  200 ml  Comp:  none  Findings:  Extensive subcutaneous loculations of pus in the right temporal area, right periparotid area, right midface, right upper and lower lip, right neck, right parapharyngeal space, retropharyngeal space, left submandibular triangle.  Procedure: With the patient in a comfortable supine position on the operating table, general anesthesia was administered per tracheostomy tube.  Anesthesia placed a right femoral arterial line and a left femoral central venous line.  CT scan was reviewed.  A routine surgical timeout was obtained.  A possible incision site on the left neck was infiltrated with 1% Xylocaine with 1 100,000 epinephrine, 4 mL total.  All dressing materials were removed from the face and neck.  The patient was placed in a slight reverse Trendelenburg with a shoulder roll for neck extension.  A Betadine solution preparation of the entire head and both anterior necks was performed.  Beginning in the right upper and lower periorbital region, liquefied necrotic muscle and fascial tissue was debrided. Dissection was carried up over the dorsum of the nose with some debridement in this area as well. Care was taken not to violate the integrity of the lower lid.  Attention was next turned to the right temporal incision. Once again liquefactive necrosis of soft tissues was identified. Portions of the temporalis fascia were removed. The incision was extended superiorly for 2 cm and branches of the superficial temporal artery were controlled with cautery. I did penetrate the temporalis muscle with a blunt hemostat and no pus was identified deep to the muscle.   Subcutaneous dissection was pursued anteriorly and communicated with the periparotid dissection from below.  Working in the periparotid region on the right side, dissection was carried out bluntly and multiple loculations of pus were released superficial to and anterior to the parotid gland. Dissection was carried down and communicated with the submandibular triangle. The incision was extended forward and the dissection was carried around the anterior edge of the sternomastoid muscle and tract into the right parapharyngeal and retropharyngeal space. There was necrotic tissue down in the depths but was not possible to be debrided.  The intraoral incisions were explored and blood clots were removed. Necrotic tissue was removed including a portion of buccal mucosa. Blunt dissection was carried back into the parapharyngeal space orally and probably into the masticator space likewise.  The submental incision was explored and extended to the left. Subcutaneous fat loculations of pus were again released and liquefied necrotic tissue was removed.  Inferior right neck incision was elongated  towards the midline. A roughly 3 x 6 cm island of black necrotic skin was resected. Again subcutaneous loculations were released bluntly. This dissection was communicated with the submental dissection, with the intraoral dissection, with the submandibular/periparotid dissection, and with the parapharyngeal dissection. There was minimal inferior extent of the infection.  A 5 cm transverse incision was made on the left neck and carried down through skin and subcutaneous fat and platysma muscle. Minimal loculations anteriorly along the side of the neck. Dissection was carried down the anterior aspect of the sternomastoid muscle bluntly and once again the parapharyngeal and retropharyngeal spaces were entered.  Hemostasis was observed at all sites. A culture had  been taken from the right temporal site and sent for aerobes,  anaerobes, and fungus. Debrided tissue was also sent for pathology and special stains for organisms.  All wounds were thoroughly irrigated with approximately 500 mL of saline.  All wounds were packed with saline moistened four-inch Kerlix. The patient was cleaned and the trach ties were changed. The shoulder roll was removed. Absorbent dressings were placed externally, loosely. Patient was returned to anesthesia, awakened, and transferred back to the 2100 medical intensive care unit in stable condition.  Dispo:  OR to MICU.  Plan:  Await additional culture results. Monitor hematocrit and white blood cell count. Daily dressing changes. Will likely need additional episodes in the operating room to complete the debridement process. Given the unusual degree of necrosis atypical for the culture results I think his prognosis remains guarded.   Cephus Richer MD

## 2017-06-17 NOTE — Progress Notes (Signed)
eLink Physician-Brief Progress Note Patient Name: [name] FranklinHlec [name] DOB: [birth year] MRN: [id number]   Date of Service  06/18/2017  HPI/Events of Note  New onset AF-RVR  eICU Interventions  Lopressor 5 x1 IV BP soft for cardizem gtt If persists, will give amio     Intervention Category Major Interventions: Arrhythmia - evaluation and management  ALVA,RAKESH V. 07/14/2017, 5:51 AM

## 2017-06-17 NOTE — Progress Notes (Signed)
Subjective:  on ventilator, sedated this am, anesthesiology present   Antibiotics:  Anti-infectives    Start     Dose/Rate Route Frequency Ordered Stop   06/01/2017 1600  clindamycin (CLEOCIN) IVPB 900 mg     900 mg 100 mL/hr over 30 Minutes Intravenous Every 8 hours 06/12/2017 1505     06/13/17 1800  Ampicillin-Sulbactam (UNASYN) 3 g in sodium chloride 0.9 % 100 mL IVPB     3 g 200 mL/hr over 30 Minutes Intravenous Every 6 hours 06/13/17 1555     06/12/17 1800  vancomycin (VANCOCIN) IVPB 1000 mg/200 mL premix  Status:  Discontinued     1,000 mg 200 mL/hr over 60 Minutes Intravenous Every 8 hours 06/12/17 1244 05/31/2017 0936   06/12/17 1430  clindamycin (CLEOCIN) IVPB 900 mg  Status:  Discontinued     900 mg 100 mL/hr over 30 Minutes Intravenous Every 8 hours 06/12/17 1415 05/21/2017 0936   06/09/17 2200  vancomycin (VANCOCIN) IVPB 750 mg/150 ml premix  Status:  Discontinued     750 mg 150 mL/hr over 60 Minutes Intravenous Every 12 hours 06/09/17 1016 06/12/17 1244   06/09/17 1400  piperacillin-tazobactam (ZOSYN) IVPB 3.375 g  Status:  Discontinued     3.375 g 100 mL/hr over 30 Minutes Intravenous Every 8 hours 06/09/17 1006 06/09/17 1007   06/09/17 1100  piperacillin-tazobactam (ZOSYN) IVPB 3.375 g  Status:  Discontinued     3.375 g 12.5 mL/hr over 240 Minutes Intravenous Every 8 hours 06/09/17 1007 06/13/17 1555   06/09/17 1030  vancomycin (VANCOCIN) 1,500 mg in sodium chloride 0.9 % 500 mL IVPB     1,500 mg 250 mL/hr over 120 Minutes Intravenous  Once 06/09/17 1016 06/09/17 1409   05/31/2017 1800  clindamycin (CLEOCIN) IVPB 600 mg  Status:  Discontinued     600 mg 100 mL/hr over 30 Minutes Intravenous Every 8 hours 06/13/2017 1658 06/09/17 1006   05/24/2017 1730  Ampicillin-Sulbactam (UNASYN) 3 g in sodium chloride 0.9 % 100 mL IVPB  Status:  Discontinued     3 g 200 mL/hr over 30 Minutes Intravenous Every 6 hours 06/10/2017 1658 06/09/17 1006   05/19/2017 0945  clindamycin  (CLEOCIN) IVPB 600 mg     600 mg 100 mL/hr over 30 Minutes Intravenous  Once 05/20/2017 0933 05/20/2017 1019      Medications: Scheduled Meds: . chlorhexidine gluconate (MEDLINE KIT)  15 mL Mouth Rinse BID  . Chlorhexidine Gluconate Cloth  6 each Topical Daily  . feeding supplement (PRO-STAT SUGAR FREE 64)  30 mL Per Tube TID  . Immune Globulin 10%  2 g/kg Intravenous Q24H  . insulin aspart  2-6 Units Subcutaneous Q4H  . insulin aspart  4 Units Subcutaneous Q4H  . insulin glargine  15 Units Subcutaneous Daily  . mouth rinse  15 mL Mouth Rinse 10 times per day  . mupirocin ointment   Topical BID  . pantoprazole sodium  40 mg Per Tube Daily  . sodium chloride flush  10-40 mL Intracatheter Q12H   Continuous Infusions: . sodium chloride 250 mL (05/28/2017 1500)  . sodium chloride 50 mL/hr at 07/02/2017 0509  . sodium chloride    . amiodarone    . ampicillin-sulbactam (UNASYN) IV Stopped (06/28/2017 0537)  . clindamycin (CLEOCIN) IV Stopped (06/16/17 2350)  . feeding supplement (GLUCERNA 1.2 CAL) Stopped (06/16/17 2354)  . fentaNYL infusion INTRAVENOUS 100 mcg/hr (06/18/2017 0616)  . lactated ringers Stopped (06/03/2017 0836)  .  propofol (DIPRIVAN) infusion 20 mcg/kg/min (07/15/2017 0617)   PRN Meds:.sodium chloride, acetaminophen (TYLENOL) oral liquid 160 mg/5 mL, acetaminophen, fentaNYL, metoprolol tartrate, sodium chloride flush    Objective: Weight change: -10.6 oz (-0.3 kg)  Intake/Output Summary (Last 24 hours) at 06/20/2017 1219 Last data filed at 07/06/2017 1041  Gross per 24 hour  Intake          5032.55 ml  Output             2105 ml  Net          2927.55 ml   Blood pressure 108/80, pulse 70, temperature (!) 93.8 F (34.3 C), temperature source Core (Comment), resp. rate 17, height 5' (1.524 m), weight 165 lb 12.6 oz (75.2 kg), SpO2 93 %. Temp:  [93.8 F (34.3 C)-101.5 F (38.6 C)] 93.8 F (34.3 C) (07/01 1158) Pulse Rate:  [70-161] 70 (07/01 1117) Resp:  [12-40] 17 (07/01  1117) BP: (70-127)/(53-102) 108/80 (07/01 1117) SpO2:  [93 %-100 %] 93 % (07/01 1117) FiO2 (%):  [30 %] 30 % (07/01 1117) Weight:  [165 lb 12.6 oz (75.2 kg)] 165 lb 12.6 oz (75.2 kg) (07/01 0459)  Physical Exam: General: sedated. On the ventilator HEENT: Bandage over much of face  CVS tachycardic,  Chest: Coarse breath sounds Abdomen: soft nontender, nondistended, normal bowel sounds, Extremities: no  clubbing or edema noted bilaterally Skin: no rashes Neuro: nonfocal  CBC: CBC Latest Ref Rng & Units 07/16/2017 07/02/2017 06/16/2017  WBC 4.0 - 10.5 K/uL - 32.6(H) 29.6(H)  Hemoglobin 13.0 - 17.0 g/dL 9.2(L) 8.6(L) 6.7(LL)  Hematocrit 39.0 - 52.0 % 27.0(L) 26.5(L) 21.4(L)  Platelets 150 - 400 K/uL - 163 160      BMET  Recent Labs  06/15/17 0330 07/16/2017 0424 07/06/2017 1010  NA 133* 142 147*  K 3.9 3.9 4.3  CL 98* 109  --   CO2 30 26  --   GLUCOSE 197* 199* 137*  BUN 10 19  --   CREATININE 0.72 0.96  --   CALCIUM 7.1* 7.1*  --      Liver Panel   Recent Labs  06/16/17 0407 06/18/2017 0424  PROT 4.9* 4.6*  ALBUMIN <1.0* <1.0*  AST 68* 53*  ALT 31 21  ALKPHOS 104 108  BILITOT 2.5* 2.4*  BILIDIR 1.5*  --   IBILI 1.0*  --        Sedimentation Rate No results for input(s): ESRSEDRATE in the last 72 hours. C-Reactive Protein No results for input(s): CRP in the last 72 hours.  Micro Results: Recent Results (from the past 720 hour(s))  Blood Culture (routine x 2)     Status: None   Collection Time: 06/02/2017  9:38 AM  Result Value Ref Range Status   Specimen Description BLOOD RIGHT HAND  Final   Special Requests   Final    BOTTLES DRAWN AEROBIC AND ANAEROBIC Blood Culture adequate volume   Culture NO GROWTH 5 DAYS  Final   Report Status 06/09/2017 FINAL  Final  Blood Culture (routine x 2)     Status: None   Collection Time: 06/06/2017  9:39 AM  Result Value Ref Range Status   Specimen Description BLOOD LEFT ANTECUBITAL  Final   Special Requests   Final     BOTTLES DRAWN AEROBIC AND ANAEROBIC Blood Culture adequate volume   Culture NO GROWTH 5 DAYS  Final   Report Status 06/09/2017 FINAL  Final  MRSA PCR Screening     Status: None  Collection Time: 06/16/2017  6:01 PM  Result Value Ref Range Status   MRSA by PCR NEGATIVE NEGATIVE Final    Comment:        The GeneXpert MRSA Assay (FDA approved for NASAL specimens only), is one component of a comprehensive MRSA colonization surveillance program. It is not intended to diagnose MRSA infection nor to guide or monitor treatment for MRSA infections.   Surgical pcr screen     Status: None   Collection Time: 06/13/2017 10:22 AM  Result Value Ref Range Status   MRSA, PCR NEGATIVE NEGATIVE Final   Staphylococcus aureus NEGATIVE NEGATIVE Final    Comment:        The Xpert SA Assay (FDA approved for NASAL specimens in patients over 109 years of age), is one component of a comprehensive surveillance program.  Test performance has been validated by [facility] for patients greater than or equal to 65 year old. It is not intended to diagnose infection nor to guide or monitor treatment.   Aerobic/Anaerobic Culture (surgical/deep wound)     Status: None   Collection Time: 06/13/2017  6:18 PM  Result Value Ref Range Status   Specimen Description ABSCESS RIGHT NECK  Final   Special Requests NONE  Final   Gram Stain   Final    MODERATE WBC PRESENT, PREDOMINANTLY PMN MODERATE GRAM NEGATIVE RODS    Culture   Final    MODERATE KLEBSIELLA PNEUMONIAE NO ANAEROBES ISOLATED    Report Status 06/16/2017 FINAL  Final   Organism ID, Bacteria KLEBSIELLA PNEUMONIAE  Final      Susceptibility   Klebsiella pneumoniae - MIC*    AMPICILLIN >=32 RESISTANT Resistant     CEFAZOLIN <=4 SENSITIVE Sensitive     CEFEPIME <=1 SENSITIVE Sensitive     CEFTAZIDIME <=1 SENSITIVE Sensitive     CEFTRIAXONE <=1 SENSITIVE Sensitive     CIPROFLOXACIN <=0.25 SENSITIVE Sensitive     GENTAMICIN <=1 SENSITIVE Sensitive      IMIPENEM <=0.25 SENSITIVE Sensitive     TRIMETH/SULFA <=20 SENSITIVE Sensitive     AMPICILLIN/SULBACTAM 8 SENSITIVE Sensitive     PIP/TAZO <=4 SENSITIVE Sensitive     Extended ESBL NEGATIVE Sensitive     * MODERATE KLEBSIELLA PNEUMONIAE  MRSA PCR Screening     Status: None   Collection Time: 05/31/2017  8:30 PM  Result Value Ref Range Status   MRSA by PCR NEGATIVE NEGATIVE Final    Comment:        The GeneXpert MRSA Assay (FDA approved for NASAL specimens only), is one component of a comprehensive MRSA colonization surveillance program. It is not intended to diagnose MRSA infection nor to guide or monitor treatment for MRSA infections.   C difficile quick scan w PCR reflex     Status: None   Collection Time: 06/13/17  9:55 AM  Result Value Ref Range Status   C Diff antigen NEGATIVE NEGATIVE Final   C Diff toxin NEGATIVE NEGATIVE Final   C Diff interpretation No C. difficile detected.  Final  Culture, blood (Routine X 2) w Reflex to ID Panel     Status: None (Preliminary result)   Collection Time: 06/03/2017  3:15 PM  Result Value Ref Range Status   Specimen Description BLOOD LEFT ANTECUBITAL  Final   Special Requests   Final    BOTTLES DRAWN AEROBIC AND ANAEROBIC Blood Culture adequate volume   Culture NO GROWTH 2 DAYS  Final   Report Status PENDING  Incomplete  Culture, blood (  Routine X 2) w Reflex to ID Panel     Status: None (Preliminary result)   Collection Time: 06/03/2017  3:15 PM  Result Value Ref Range Status   Specimen Description BLOOD BLOOD LEFT HAND  Final   Special Requests   Final    BOTTLES DRAWN AEROBIC AND ANAEROBIC Blood Culture adequate volume   Culture NO GROWTH 2 DAYS  Final   Report Status PENDING  Incomplete    Studies/Results: Dg Chest Port 1 View  Result Date: 07/15/2017 CLINICAL DATA:  Followup ventilator support.  Respiratory failure. EXAM: PORTABLE CHEST 1 VIEW COMPARISON:  06/15/2017 FINDINGS: Tracheostomy appears unchanged. Right arm PICC tip in  the SVC 4 cm above the right atrium. Soft feeding tube tip in the mid body of the stomach. Patient has developed worsening pulmonary infiltrates in the lower lungs, right more than left, consistent with bronchopneumonia. Aspiration not excluded. Small amount of pleural fluid on the right. IMPRESSION: Development of bilateral lower lobe infiltrates right worse than left consistent with bronchopneumonia. Aspiration not excluded. Electronically Signed   By: Nelson Chimes M.D.   On: 07/12/2017 10:28   Dg Abd Portable 1v  Result Date: 06/16/2017 CLINICAL DATA:  Encounter for nasogastric tube placement EXAM: PORTABLE ABDOMEN - 1 VIEW COMPARISON:  Portable exam 1118 hours compared to 06/15/2017 FINDINGS: Tip of feeding tube projects over proximal stomach. Bowel gas pattern normal. No bowel dilatation or bowel wall thickening. Lung bases grossly clear. IMPRESSION: Tip of feeding tube projects over proximal stomach. Electronically Signed   By: Lavonia Dana M.D.   On: 06/16/2017 11:30      Assessment/Plan:  INTERVAL HISTORY: as of time of writing of this note patient is now sp repeat surgery by Dr. Erik Obey   Active Problems:   Acute parotitis   Facial cellulitis   Acute respiratory failure ([place])   Acute chest pain   Status post tracheostomy ([place])   Parapharyngeal abscess   Klebsiella infection    [name] [name] is a 61 y.o. male with  Deep tissue, neck abscess/parapharyngeal abscess with Kleb pNA on cultures. 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  Patient currently on Unasyn and Clindamycin   I suspect the main issue here was  need for repeat surgery. However while we await cultures to come back from OR I am going to broaden out by going from unasyn to merropenem FOR NOW  I expect we will  Narrow back in next few days  Dr. Megan Salon is taking over the service tomorrow.       LOS: 13 days   Alcide Evener 07/12/2017, 12:19 PM

## 2017-06-17 NOTE — Anesthesia Procedure Notes (Signed)
Date/Time: 07/14/2017 8:03 AM Performed by: Lucinda DellECARLO, Sten Dematteo M Pre-anesthesia Checklist: Patient identified, Emergency Drugs available, Suction available and Patient being monitored Patient Re-evaluated:Patient Re-evaluated prior to inductionOxygen Delivery Method: Circle system utilized Intubation Type: Inhalational induction with existing ETT Placement Confirmation: positive ETCO2 and breath sounds checked- equal and bilateral Comments: Patient arrived with cuffed trach in place. Inhalation induction via trach and controlled ventilation. +EtCO2, +BBS.

## 2017-06-17 NOTE — Anesthesia Postprocedure Evaluation (Signed)
Anesthesia Post Note  Patient: [name] [name]  Procedure(s) Performed: Procedure(s) (LRB): WOUND EXPLORATION AND DRAINAGE FACE W/ DEBRIDMENT OF NECK (Bilateral)     Patient location during evaluation: ICU Anesthesia Type: General Level of consciousness: sedated and patient remains intubated per anesthesia plan Pain management: pain level controlled Vital Signs Assessment: post-procedure vital signs reviewed and stable Respiratory status: patient on ventilator - see flowsheet for VS and patient remains intubated per anesthesia plan Cardiovascular status: blood pressure returned to baseline Anesthetic complications: no    Last Vitals:  Vitals:   06/26/2017 1400 06/20/2017 1522  BP: 107/66 100/75  Pulse: 88   Resp: 18   Temp:      Last Pain:  Vitals:   07/03/2017 1158  TempSrc: Core (Comment)  PainSc:                  Lacy Sofia COKER

## 2017-06-17 NOTE — Progress Notes (Signed)
RN aware of pt temp of 93.8 core. Warm blankets applied.

## 2017-06-17 NOTE — Anesthesia Procedure Notes (Signed)
Central Venous Catheter Insertion Performed by: Kipp BroodJOSLIN, Lani Havlik, anesthesiologist Start/End07/27/2018 8:20 AM, 06/17/2017 8:25 AM Preanesthetic checklist: patient identified, IV checked, site marked, risks and benefits discussed, surgical consent, monitors and equipment checked, pre-op evaluation and timeout performed Position: supine Hand hygiene performed , maximum sterile barriers used  and Seldinger technique used Catheter size: 8 Fr Total catheter length 17. Central line was placed.Double lumen Procedure performed using ultrasound guided technique. Ultrasound Notes:anatomy identified, needle tip was noted to be adjacent to the nerve/plexus identified and no ultrasound evidence of intravascular and/or intraneural injection Attempts: 1 Following insertion, line sutured, dressing applied and Biopatch. Post procedure assessment: blood return through all ports, free fluid flow and no air  Patient tolerated the procedure well with no immediate complications.

## 2017-06-17 NOTE — Progress Notes (Signed)
  Amiodarone Drug - Drug Interaction Consult Note  Recommendations: NO SPECIFIC CURRENT DRUG INTERACTIONS. Amiodarone is metabolized by the cytochrome P450 system and therefore has the potential to cause many drug interactions. Amiodarone has an average plasma half-life of 50 days (range 20 to 100 days).   There is potential for drug interactions to occur several weeks or months after stopping treatment and the onset of drug interactions may be slow after initiating amiodarone.   []  Statins: Increased risk of myopathy. Simvastatin- restrict dose to 20mg  daily. Other statins: counsel patients to report any muscle pain or weakness immediately.  []  Anticoagulants: Amiodarone can increase anticoagulant effect. Consider warfarin dose reduction. Patients should be monitored closely and the dose of anticoagulant altered accordingly, remembering that amiodarone levels take several weeks to stabilize.  []  Antiepileptics: Amiodarone can increase plasma concentration of phenytoin, the dose should be reduced. Note that small changes in phenytoin dose can result in large changes in levels. Monitor patient and counsel on signs of toxicity.  []  Beta blockers: increased risk of bradycardia, AV block and myocardial depression. Sotalol - avoid concomitant use.  []   Calcium channel blockers (diltiazem and verapamil): increased risk of bradycardia, AV block and myocardial depression.  []   Cyclosporine: Amiodarone increases levels of cyclosporine. Reduced dose of cyclosporine is recommended.  []  Digoxin dose should be halved when amiodarone is started.  []  Diuretics: increased risk of cardiotoxicity if hypokalemia occurs.  []  Oral hypoglycemic agents (glyburide, glipizide, glimepiride): increased risk of hypoglycemia. Patient's glucose levels should be monitored closely when initiating amiodarone therapy.   []  Drugs that prolong the QT interval:  Torsades de pointes risk may be increased with concurrent use -  avoid if possible.  Monitor QTc, also keep magnesium/potassium WNL if concurrent therapy can't be avoided. Marland Kitchen. Antibiotics: e.g. fluoroquinolones, erythromycin. . Antiarrhythmics: e.g. quinidine, procainamide, disopyramide, sotalol. . Antipsychotics: e.g. phenothiazines, haloperidol.  . Lithium, tricyclic antidepressants, and methadone.   Thank You,  Vernard GamblesVeronda Raciel Caffrey, PharmD, BCPS  06/21/2017 6:01 AM

## 2017-06-17 NOTE — Anesthesia Procedure Notes (Signed)
Arterial Line Insertion Start/End07/01/2017 8:20 AM, 06/17/2017 8:25 AM Performed by: Noreene LarssonJOSLIN, [name] Doescher, anesthesiologist  Preanesthetic checklist: patient identified, IV checked, site marked, risks and benefits discussed, surgical consent, monitors and equipment checked, pre-op evaluation and timeout performed Right, femoral was placed Catheter size: 20 G  Attempts: 1 Procedure performed using ultrasound guided technique. Ultrasound Notes:anatomy identified and image(s) printed for medical record Following insertion, line sutured, dressing applied and Biopatch. Patient tolerated the procedure well with no immediate complications.

## 2017-06-17 NOTE — Transfer of Care (Signed)
Immediate Anesthesia Transfer of Care Note  Patient: [name] [name]  Procedure(s) Performed: Procedure(s): WOUND EXPLORATION AND DRAINAGE FACE W/ DEBRIDMENT OF NECK (Bilateral)  Patient Location: ICU  Anesthesia Type:General  Level of Consciousness: sedated, unresponsive and Patient remains intubated per anesthesia plan  Airway & Oxygen Therapy: Patient remains intubated per anesthesia plan and Patient placed on Ventilator (see vital sign flow sheet for setting)  Post-op Assessment: Report given to RN and Post -op Vital signs reviewed and stable  Post vital signs: Reviewed and stable  Last Vitals:  Vitals:   07/03/2017 0735 07/13/2017 1117  BP: (!) 88/58 108/80  Pulse: 94 70  Resp: 20 17  Temp:      Last Pain:  Vitals:   07/07/2017 0421  TempSrc: Core (Comment)  PainSc:       Patients Stated Pain Goal: 3 (06/08/17 0758)  Complications: No apparent anesthesia complications

## 2017-06-17 NOTE — Progress Notes (Signed)
Pharmacy Antibiotic Note  [name] [name] is a 61 y.o. male admitted on 06/13/2017 with necrotizing abscess.  Pharmacy has been consulted for meropenem dosing. Tmax is 101.5 and WBC is elevated at 32.6, SCr is WNL at 0.96.   Plan: Meropenem 1gm IV Q8H F/u renal fxn, C&S, clinical status  Height: 5' (152.4 cm) Weight: 165 lb 12.6 oz (75.2 kg) IBW/kg (Calculated) : 50  Temp (24hrs), Avg:99.9 F (37.7 C), Min:93.8 F (34.3 C), Max:101.5 F (38.6 C)   Recent Labs Lab 06/12/17 0458  06/12/17 0940 06/12/17 2324 06/13/17 0415 06/13/17 1230 05/25/2017 0914 05/24/2017 0930 06/15/17 0330 06/15/17 2008 06/16/17 0407 06/16/17 1130 06/16/17 1744 07/08/2017 0424  WBC  --   < >  --   --  18.7*  --  29.9*  --  38.1* 28.0* 21.0* 31.0* 29.6* 32.6*  CREATININE 0.95  --   --   --  0.92  --  0.71  --  0.72  --   --   --   --  0.96  LATICACIDVEN  --   --   --  2.9*  --  2.2*  --   --   --   --   --   --   --   --   VANCOTROUGH  --   --  10*  --   --   --   --  20  --   --   --   --   --   --   < > = values in this interval not displayed.  Estimated Creatinine Clearance: 70.4 mL/min (by C-G formula based on SCr of 0.96 mg/dL).    No Known Allergies  Antimicrobials this admission: Meropenem 7/1>> 6/18 Unasyn >6/23; 6/27>>7/1 6/18 Clinda >6/23; 6/26>> 6/23 Vanc>6/28 6/23 Zosyn>>6/27  Dose adjustments this admission: 6/26 VT = 10 on vancomycin 750 mg q 12 hr, inc to q 8 hrs 6/28 VT = 20 on vancomycin 750 mg q 8 hrs > then vanc d/c'd  Microbiology results: 6/18 blood cx: neg 6/18 HIV neg 6/18 MRSA neg 6/25 neck abscess > k. Pneumoniae - sens to all abx except amp 6/27 C. Diff neg 6/28 Blood - NGTD  Thank you for allowing pharmacy to be a part of this patient's care.  Giovanie Lefebre, Drake LeachRachel Lynn 06/28/2017 12:54 PM

## 2017-06-17 NOTE — Anesthesia Preprocedure Evaluation (Signed)
Anesthesia Evaluation  Patient identified by MRN, date of birth, ID band Patient awake    Reviewed: Allergy & Precautions, NPO status , Patient's Chart, lab work & pertinent test results  Airway Mallampati: Trach   Neck ROM: Limited    Dental   Pulmonary former smoker,    + rhonchi        Cardiovascular hypertension,  Rhythm:Irregular Rate:Tachycardia     Neuro/Psych    GI/Hepatic   Endo/Other  diabetes  Renal/GU      Musculoskeletal   Abdominal   Peds  Hematology   Anesthesia Other Findings   Reproductive/Obstetrics                             Anesthesia Physical Anesthesia Plan  ASA: IV and emergent  Anesthesia Plan: General   Post-op Pain Management:    Induction: Intravenous  PONV Risk Score and Plan: Ondansetron and Propofol  Airway Management Planned: Tracheostomy  Additional Equipment:   Intra-op Plan:   Post-operative Plan:   Informed Consent: I have reviewed the patients History and Physical, chart, labs and discussed the procedure including the risks, benefits and alternatives for the proposed anesthesia with the patient or authorized representative who has indicated his/her understanding and acceptance.     Plan Discussed with:   Anesthesia Plan Comments:         Anesthesia Quick Evaluation

## 2017-06-17 DEATH — deceased

## 2017-06-18 ENCOUNTER — Inpatient Hospital Stay (HOSPITAL_COMMUNITY): Payer: 59

## 2017-06-18 ENCOUNTER — Encounter (HOSPITAL_COMMUNITY): Payer: Self-pay | Admitting: Otolaryngology

## 2017-06-18 DIAGNOSIS — B182 Chronic viral hepatitis C: Secondary | ICD-10-CM

## 2017-06-18 DIAGNOSIS — Z930 Tracheostomy status: Secondary | ICD-10-CM

## 2017-06-18 LAB — CBC WITH DIFFERENTIAL/PLATELET
BASOS ABS: 0 10*3/uL (ref 0.0–0.1)
Basophils Relative: 0 %
Eosinophils Absolute: 0 10*3/uL (ref 0.0–0.7)
Eosinophils Relative: 0 %
HCT: 31 % — ABNORMAL LOW (ref 39.0–52.0)
HEMOGLOBIN: 10.2 g/dL — AB (ref 13.0–17.0)
LYMPHS ABS: 0.6 10*3/uL — AB (ref 0.7–4.0)
LYMPHS PCT: 5 %
MCH: 27.4 pg (ref 26.0–34.0)
MCHC: 32.9 g/dL (ref 30.0–36.0)
MCV: 83.3 fL (ref 78.0–100.0)
MONOS PCT: 5 %
Monocytes Absolute: 0.6 10*3/uL (ref 0.1–1.0)
Neutro Abs: 10.7 10*3/uL — ABNORMAL HIGH (ref 1.7–7.7)
Neutrophils Relative %: 90 %
Platelets: 77 10*3/uL — ABNORMAL LOW (ref 150–400)
RBC: 3.72 MIL/uL — AB (ref 4.22–5.81)
RDW: 15.9 % — ABNORMAL HIGH (ref 11.5–15.5)
WBC Morphology: INCREASED
WBC: 11.9 10*3/uL — AB (ref 4.0–10.5)

## 2017-06-18 LAB — BLOOD GAS, ARTERIAL
ACID-BASE EXCESS: 0.1 mmol/L (ref 0.0–2.0)
Acid-base deficit: 1.3 mmol/L (ref 0.0–2.0)
BICARBONATE: 23.6 mmol/L (ref 20.0–28.0)
Bicarbonate: 25.3 mmol/L (ref 20.0–28.0)
DRAWN BY: 280981
Drawn by: 345601
FIO2: 40
FIO2: 50
LHR: 16 {breaths}/min
MECHVT: 400 mL
O2 SAT: 94.5 %
O2 Saturation: 93.8 %
PATIENT TEMPERATURE: 100.1
PCO2 ART: 49.5 mmHg — AB (ref 32.0–48.0)
PEEP/CPAP: 5 cmH2O
PEEP/CPAP: 10 cmH2O
PH ART: 7.33 — AB (ref 7.350–7.450)
Patient temperature: 98.6
RATE: 20 resp/min
VT: 400 mL
pCO2 arterial: 46.2 mmHg (ref 32.0–48.0)
pH, Arterial: 7.334 — ABNORMAL LOW (ref 7.350–7.450)
pO2, Arterial: 73.7 mmHg — ABNORMAL LOW (ref 83.0–108.0)
pO2, Arterial: 71 mmHg — ABNORMAL LOW (ref 83.0–108.0)

## 2017-06-18 LAB — POCT I-STAT 4, (NA,K, GLUC, HGB,HCT)
Glucose, Bld: 138 mg/dL — ABNORMAL HIGH (ref 65–99)
HCT: 27 % — ABNORMAL LOW (ref 39.0–52.0)
Hemoglobin: 9.2 g/dL — ABNORMAL LOW (ref 13.0–17.0)
Potassium: 3.8 mmol/L (ref 3.5–5.1)
SODIUM: 146 mmol/L — AB (ref 135–145)

## 2017-06-18 LAB — GLUCOSE, CAPILLARY
GLUCOSE-CAPILLARY: 74 mg/dL (ref 65–99)
Glucose-Capillary: 102 mg/dL — ABNORMAL HIGH (ref 65–99)
Glucose-Capillary: 112 mg/dL — ABNORMAL HIGH (ref 65–99)
Glucose-Capillary: 113 mg/dL — ABNORMAL HIGH (ref 65–99)
Glucose-Capillary: 67 mg/dL (ref 65–99)
Glucose-Capillary: 69 mg/dL (ref 65–99)
Glucose-Capillary: 70 mg/dL (ref 65–99)

## 2017-06-18 LAB — POTASSIUM: POTASSIUM: 4 mmol/L (ref 3.5–5.1)

## 2017-06-18 LAB — POCT I-STAT 7, (LYTES, BLD GAS, ICA,H+H)
ACID-BASE EXCESS: 4 mmol/L — AB (ref 0.0–2.0)
Bicarbonate: 29.4 mmol/L — ABNORMAL HIGH (ref 20.0–28.0)
CALCIUM ION: 1.04 mmol/L — AB (ref 1.15–1.40)
HCT: 23 % — ABNORMAL LOW (ref 39.0–52.0)
HEMOGLOBIN: 7.8 g/dL — AB (ref 13.0–17.0)
O2 SAT: 100 %
PH ART: 7.425 (ref 7.350–7.450)
POTASSIUM: 3.8 mmol/L (ref 3.5–5.1)
Patient temperature: 36.5
Sodium: 146 mmol/L — ABNORMAL HIGH (ref 135–145)
TCO2: 31 mmol/L (ref 0–100)
pCO2 arterial: 44.6 mmHg (ref 32.0–48.0)
pO2, Arterial: 384 mmHg — ABNORMAL HIGH (ref 83.0–108.0)

## 2017-06-18 LAB — COMPREHENSIVE METABOLIC PANEL
ALT: 15 U/L — ABNORMAL LOW (ref 17–63)
ANION GAP: 4 — AB (ref 5–15)
AST: 65 U/L — ABNORMAL HIGH (ref 15–41)
Albumin: <1 g/dL — ABNORMAL LOW (ref 3.5–5.0)
Alkaline Phosphatase: 107 U/L (ref 38–126)
BILIRUBIN TOTAL: 1.5 mg/dL — AB (ref 0.3–1.2)
BUN: 30 mg/dL — AB (ref 6–20)
CO2: 25 mmol/L (ref 22–32)
Calcium: 6.5 mg/dL — ABNORMAL LOW (ref 8.9–10.3)
Chloride: 107 mmol/L (ref 101–111)
Creatinine, Ser: 1.3 mg/dL — ABNORMAL HIGH (ref 0.61–1.24)
GFR, EST NON AFRICAN AMERICAN: 59 mL/min — AB (ref >60)
Glucose, Bld: 115 mg/dL — ABNORMAL HIGH (ref 65–99)
POTASSIUM: 4.1 mmol/L (ref 3.5–5.1)
Sodium: 136 mmol/L (ref 135–145)
TOTAL PROTEIN: 6.8 g/dL (ref 6.5–8.1)

## 2017-06-18 LAB — PATHOLOGIST SMEAR REVIEW

## 2017-06-18 LAB — HEPATITIS B SURFACE ANTIBODY, QUANTITATIVE: HEPATITIS B-POST: 33.8 m[IU]/mL

## 2017-06-18 LAB — HEPATITIS B SURFACE ANTIGEN: Hepatitis B Surface Ag: NEGATIVE

## 2017-06-18 LAB — PHOSPHORUS: PHOSPHORUS: 6.9 mg/dL — AB (ref 2.5–4.6)

## 2017-06-18 LAB — MAGNESIUM: MAGNESIUM: 1.9 mg/dL (ref 1.7–2.4)

## 2017-06-18 MED ORDER — FUROSEMIDE 10 MG/ML IJ SOLN: 40.0000 mg | Freq: Two times a day (BID) | INTRAMUSCULAR | Status: DC

## 2017-06-18 MED ORDER — FUROSEMIDE 10 MG/ML IJ SOLN
INTRAMUSCULAR | Status: AC
  Filled 2017-06-18: qty 4

## 2017-06-18 MED ORDER — FUROSEMIDE 10 MG/ML IJ SOLN
40.0000 mg | Freq: Two times a day (BID) | INTRAMUSCULAR | Status: DC
  Administered 2017-06-18 – 2017-06-19 (×3): 40 mg via INTRAVENOUS
  Filled 2017-06-18 (×2): qty 4

## 2017-06-18 MED ORDER — DEXTROSE 50 % IV SOLN
INTRAVENOUS | Status: AC
  Administered 2017-06-18: 25 mL
  Filled 2017-06-18: qty 50

## 2017-06-18 MED ORDER — PROPOFOL 1000 MG/100ML IV EMUL
0.0000 ug/kg/min | INTRAVENOUS | Status: DC
  Administered 2017-06-18: 15 ug/kg/min via INTRAVENOUS
  Administered 2017-06-19: 25 ug/kg/min via INTRAVENOUS
  Administered 2017-06-19 (×2): 15 ug/kg/min via INTRAVENOUS
  Administered 2017-06-20: 20 ug/kg/min via INTRAVENOUS
  Administered 2017-06-20: 30 ug/kg/min via INTRAVENOUS
  Filled 2017-06-18 (×7): qty 100

## 2017-06-18 NOTE — Progress Notes (Signed)
06/18/2017 9:56 AM  [name] [name], [name] [name] [id number]  Post-Op Day 1/4/7    Temp:  [93.8 F (34.3 C)-103.1 F (39.5 C)] 100.4 F (38 C) (07/02 0759) Pulse Rate:  [70-105] 98 (07/02 0920) Resp:  [17-35] 30 (07/02 0900) BP: (100-144)/(61-85) 115/64 (07/02 0920) SpO2:  [90 %-99 %] 96 % (07/02 0920) Arterial Line BP: (122-165)/(48-62) 135/50 (07/02 0900) FiO2 (%):  [30 %-100 %] 50 % (07/02 0920) Weight:  [79 kg (174 lb 2.6 oz)] 79 kg (174 lb 2.6 oz) (07/02 0500),     Intake/Output Summary (Last 24 hours) at 06/18/17 0956 Last data filed at 06/18/17 0800  Gross per 24 hour  Intake           5235.1 ml  Output             3285 ml  Net           1950.1 ml    Results for orders placed or performed during the hospital encounter of 06/16/2017 (from the past 24 hour(s))  I-STAT 4, (NA,K, GLUC, HGB,HCT)     Status: Abnormal   Collection Time: 07-16-2017 10:10 AM  Result Value Ref Range   Sodium 147 (H) 135 - 145 mmol/L   Potassium 4.3 3.5 - 5.1 mmol/L   Glucose, Bld 137 (H) 65 - 99 mg/dL   HCT 40.9 (L) 81.1 - 91.4 %   Hemoglobin 9.2 (L) 13.0 - 17.0 g/dL  Glucose, capillary     Status: Abnormal   Collection Time: 2017-07-16 11:59 AM  Result Value Ref Range   Glucose-Capillary 131 (H) 65 - 99 mg/dL   Comment 1 Capillary Specimen    Comment 2 Notify RN   Glucose, capillary     Status: Abnormal   Collection Time: 07/16/2017  4:23 PM  Result Value Ref Range   Glucose-Capillary 148 (H) 65 - 99 mg/dL   Comment 1 Capillary Specimen    Comment 2 Notify RN   CBC     Status: Abnormal   Collection Time: 16-Jul-2017  5:15 PM  Result Value Ref Range   WBC 31.6 (H) 4.0 - 10.5 K/uL   RBC 4.07 (L) 4.22 - 5.81 MIL/uL   Hemoglobin 11.5 (L) 13.0 - 17.0 g/dL   HCT 78.2 (L) 95.6 - 21.3 %   MCV 84.0 78.0 - 100.0 fL   MCH 28.3 26.0 - 34.0 pg   MCHC 33.6 30.0 - 36.0 g/dL   RDW 08.6 (H) 57.8 - 46.9 %   Platelets 122 (L) 150 - 400 K/uL  Glucose, capillary     Status: Abnormal   Collection Time: 07-16-17  8:18 PM   Result Value Ref Range   Glucose-Capillary 131 (H) 65 - 99 mg/dL   Comment 1 Notify RN   Triglycerides     Status: Abnormal   Collection Time: 07/16/17 11:00 PM  Result Value Ref Range   Triglycerides 273 (H) <150 mg/dL  Glucose, capillary     Status: Abnormal   Collection Time: 07/16/17 11:46 PM  Result Value Ref Range   Glucose-Capillary 142 (H) 65 - 99 mg/dL   Comment 1 Notify RN   Blood gas, arterial     Status: Abnormal   Collection Time: 06/18/17  3:35 AM  Result Value Ref Range   FIO2 40.00    Delivery systems VENTILATOR    Mode PRESSURE REGULATED VOLUME CONTROL    VT 400 mL   LHR 16 resp/min   Peep/cpap 5.0 cm H20   pH, Arterial 7.334 (L)  7.350 - 7.450   pCO2 arterial 46.2 32.0 - 48.0 mmHg   pO2, Arterial 73.7 (L) 83.0 - 108.0 mmHg   Bicarbonate 23.6 20.0 - 28.0 mmol/L   Acid-base deficit 1.3 0.0 - 2.0 mmol/L   O2 Saturation 93.8 %   Patient temperature 100.1    Collection site A-LINE    Drawn by [id number]    Sample type ARTERIAL DRAW   Comprehensive metabolic panel     Status: Abnormal   Collection Time: 06/18/17  4:00 AM  Result Value Ref Range   Sodium 136 135 - 145 mmol/L   Potassium 4.1 3.5 - 5.1 mmol/L   Chloride 107 101 - 111 mmol/L   CO2 25 22 - 32 mmol/L   Glucose, Bld 115 (H) 65 - 99 mg/dL   BUN 30 (H) 6 - 20 mg/dL   Creatinine, Ser 0.271.30 (H) 0.61 - 1.24 mg/dL   Calcium 6.5 (L) 8.9 - 10.3 mg/dL   Total Protein 6.8 6.5 - 8.1 g/dL   Albumin <2.5<1.0 (L) 3.5 - 5.0 g/dL   AST 65 (H) 15 - 41 U/L   ALT 15 (L) 17 - 63 U/L   Alkaline Phosphatase 107 38 - 126 U/L   Total Bilirubin 1.5 (H) 0.3 - 1.2 mg/dL   GFR calc non Af Amer 59 (L) >60 mL/min   GFR calc Af Amer >60 >60 mL/min   Anion gap 4 (L) 5 - 15  CBC with Differential/Platelet     Status: Abnormal   Collection Time: 06/18/17  4:00 AM  Result Value Ref Range   WBC 11.9 (H) 4.0 - 10.5 K/uL   RBC 3.72 (L) 4.22 - 5.81 MIL/uL   Hemoglobin 10.2 (L) 13.0 - 17.0 g/dL   HCT 36.631.0 (L) 44.039.0 - 34.752.0 %   MCV 83.3  78.0 - 100.0 fL   MCH 27.4 26.0 - 34.0 pg   MCHC 32.9 30.0 - 36.0 g/dL   RDW 42.515.9 (H) 95.611.5 - 38.715.5 %   Platelets 77 (L) 150 - 400 K/uL   Neutrophils Relative % 90 %   Lymphocytes Relative 5 %   Monocytes Relative 5 %   Eosinophils Relative 0 %   Basophils Relative 0 %   Neutro Abs 10.7 (H) 1.7 - 7.7 K/uL   Lymphs Abs 0.6 (L) 0.7 - 4.0 K/uL   Monocytes Absolute 0.6 0.1 - 1.0 K/uL   Eosinophils Absolute 0.0 0.0 - 0.7 K/uL   Basophils Absolute 0.0 0.0 - 0.1 K/uL   RBC Morphology RARE NRBCs    WBC Morphology INCREASED BANDS (>20% BANDS)   Glucose, capillary     Status: None   Collection Time: 06/18/17  7:51 AM  Result Value Ref Range   Glucose-Capillary 67 65 - 99 mg/dL   Comment 1 Document in Chart   Glucose, capillary     Status: None   Collection Time: 06/18/17  7:56 AM  Result Value Ref Range   Glucose-Capillary 70 65 - 99 mg/dL   Comment 1 Arterial Specimen     SUBJECTIVE:  Still sedated, but minimally responsive.  Had a discussion with numerous family members with an interpreter this AM.  Discussed current status, plans, prognosis.  OBJECTIVE:  Less LEFT neck swelling.  Progressive necrosis of RIGHT neck skin.  No frank pus today!  Minimal visible necrotic tissue.  Wounds repacked with saline moistened  Kerlix.  IMPRESSION:  Possible interval improvement s/p 3rd debridement yesterday.  No pus, no necrotic tissue visible.  WBC 12K.  PLAN:  Await new cultures.  Continue dressing changes.  Will assess daily for possible return to OR.  Flo Shanks

## 2017-06-18 NOTE — Progress Notes (Signed)
Patient ID: [name] [name], male   DOB: 1956-06-02, 61 y.o.   MRN: [id number]          [facility]    Date of Admission:  05/28/2017   Total days of antibiotics 15        Day 7 clindamycin        Day 2 meropenem  Mr. [name] has extensive, multiloculated head and neck abscesses that were debrided again yesterday. I discussed the case with Dr. Lazarus SalinesWolicki. The operative Gram stain shows gram-negative rods which may be the same Klebsiella isolated from the initial cultures. I will continue current antibiotics pending final culture results. It will probably be safe to narrow antibiotic therapy soon.         Cliffton AstersJohn Cove Haydon, MD [facility] [phone] pager   [phone] cell 06/18/2017, 10:23 AM

## 2017-06-18 NOTE — Care Management Note (Signed)
Case Management Note Previous CM note initiated by Donn PieriniKristi Webster  Patient Details  Name: Jannifer FranklinHlec Insalaco MRN: [id number] Date of Birth: Jun 02, 1956  Subjective/Objective:   From home with wife, pta indep,  Presents with  right acute parotitis w/ extensive facial cellulitis. He has [employer] insurance. [facility] but they have moved to another area and he has being seeing a Dr. Neva SeatGreene.  PCP   Dr. Neva SeatGreene             Action/Plan: NCM will follow for dc needs.   Expected Discharge Date:                  Expected Discharge Plan:  Home/Self Care  In-House Referral:     Discharge planning Services  CM Consult  Post Acute Care Choice:    Choice offered to:     DME Arranged:    DME Agency:     HH Arranged:    HH Agency:     Status of Service:  In process, will continue to follow  If discussed at Long Length of Stay Meetings, dates discussed:    Discharge Disposition:   Additional Comments: 06/18/2017 Pt now s/p trach - still requiring vent at 50% - required bronch 06/18/17.  Pt is on continuous sedation, IV antibiotics and tube feeds. Raynald BlendSamantha Majour Frei, RN, BSN [phone]    06/12/17- 1045-  Pt currently on vent- s/p I&D by ENT on 6/25 for Right parapharyngeal and neck abscesses-  Pt can speak some conversational english but primary language is [place]agn[place]- per CM consult received- Walden FieldCarol Bayer, [phone]-- she Provides aid in their community and gives updates from english to community language. She feels pt and family may not be understanding information being given-- and need for interpreter may be best for any important information shared with pt/family.  CM will continue to follow for any d/c needs.

## 2017-06-18 NOTE — Procedures (Signed)
Bedside Bronchoscopy Procedure Note [name] [name] [id number] 04-Apr-1956  Procedure: Bronchoscopy Indications: Diagnostic evaluation of the airways, Obtain specimens for culture and/or other diagnostic studies and Remove secretions  Procedure Details: #8shiley Bite block in place: No In preparation for procedure, Patient hyper-oxygenated with 100 % FiO2 Airway entered and the following bronchi were examined: RUL, RML, RLL, LUL, LLL and Bronchi.   Bronchoscope removed.  , Patient placed back on 50% FiO2 at conclusion of procedure.    Evaluation BP (!) 103/57   Pulse 91   Temp (!) 100.4 F (38 C) (Core (Comment))   Resp (!) 28   Ht 5' (1.524 m)   Wt 174 lb 2.6 oz (79 kg)   SpO2 94%   BMI 34.01 kg/m  Breath Sounds:Clear and Diminished O2 sats: stable throughout Patient's Current Condition: stable Specimens:  Sent  Complications: No apparent complications Patient did tolerate procedure well.   Cherylin MylarDoyle, Camaryn Lumbert 06/18/2017, 11:23 AM

## 2017-06-18 NOTE — Progress Notes (Signed)
cbg 299 did not cross over into computer

## 2017-06-18 NOTE — Progress Notes (Signed)
PULMONARY / CRITICAL CARE MEDICINE   Name: [name] [name] MRN: [id number] DOB: [birth year]    ADMISSION DATE:  06-21-2017 CONSULTATION DATE:  05/27/2017  REFERRING MD:  Dr. Sharon Seller  CHIEF COMPLAINT:  Right parapharyngeal and neck abscesses  HISTORY OF PRESENT ILLNESS:  61 neck abscess, I and D, trach, vent   SUBJECTIVE:  Bedside assessment by ENT improved wound hypoglcyemia Hypoxia with fio2 increase Some hypothermia tuen to 102 on nights Cooling blanket successful  VITAL SIGNS: BP (!) 103/57   Pulse 91   Temp (!) 100.4 F (38 C) (Core (Comment))   Resp (!) 28   Ht 5' (1.524 m)   Wt 79 kg (174 lb 2.6 oz)   SpO2 94%   BMI 34.01 kg/m   HEMODYNAMICS: CVP:  [10 mmHg-14 mmHg] 14 mmHg  VENTILATOR SETTINGS: Vent Mode: PRVC FiO2 (%):  [30 %-100 %] 50 % Set Rate:  [16 bmp] 16 bmp Vt Set:  [400 mL] 400 mL PEEP:  [5 cmH20] 5 cmH20 Plateau Pressure:  [16 cmH20-17 cmH20] 17 cmH20  INTAKE / OUTPUT: I/O last 3 completed shifts: In: 7960 [I.V.:4803.5; Blood:1706; NG/GT:850.5; IV Piggyback:600] Out: 3990 [Urine:3390; Stool:300; Blood:300]  PHYSICAL EXAMINATION: General: rass-5 Neuro: rass -5, perrl HEENT: wounds open packed, no bleeding PULM: ronchi CV: s1 s2RRR no r GI: soft, bs low no r Extremities:  Edema increased      LABS:  BMET  Recent Labs Lab 06/15/17 0330 07/07/2017 0424  06/22/2017 0850 07/08/2017 1010 06/18/17 0400  NA 133* 142  < > 146* 147* 136  K 3.9 3.9  < > 3.8 4.3 4.1  CL 98* 109  --   --   --  107  CO2 30 26  --   --   --  25  BUN 10 19  --   --   --  30*  CREATININE 0.72 0.96  --   --   --  1.30*  GLUCOSE 197* 199*  --  138* 137* 115*  < > = values in this interval not displayed.  Electrolytes  Recent Labs Lab 06/12/17 0458 06/13/17 0415  06/15/17 0330 07/03/2017 0424 06/22/2017 0436 06/18/17 0400  CALCIUM 7.6* 7.5*  < > 7.1* 7.1*  --  6.5*  MG 1.8 1.9  --  1.7  --  2.1  --   PHOS 2.4* 3.6  --  4.2  --   --   --   < > = values in this  interval not displayed.  CBC  Recent Labs Lab 06/26/2017 0424  06/24/2017 1010 07/04/2017 1715 06/18/17 0400  WBC 32.6*  --   --  31.6* 11.9*  HGB 8.6*  < > 9.2* 11.5* 10.2*  HCT 26.5*  < > 27.0* 34.2* 31.0*  PLT 163  --   --  122* 77*  < > = values in this interval not displayed.  Coag's  Recent Labs Lab 06/15/17 1125 06/16/17 0407  APTT 53*  --   INR 1.40 1.30    Sepsis Markers  Recent Labs Lab 06/12/17 2324 06/13/17 1230  LATICACIDVEN 2.9* 2.2*    ABG  Recent Labs Lab 06/13/2017 0849 07/05/2017 0841 06/18/17 0335  PHART 7.392 7.425 7.334*  PCO2ART 57.4* 44.6 46.2  PO2ART 111.0* 384.0* 73.7*    Liver Enzymes  Recent Labs Lab 06/16/17 0407 07/11/2017 0424 06/18/17 0400  AST 68* 53* 65*  ALT 31 21 15*  ALKPHOS 104 108 107  BILITOT 2.5* 2.4* 1.5*  ALBUMIN <1.0* <1.0* <1.0*  Cardiac Enzymes No results for input(s): TROPONINI, PROBNP in the last 168 hours.  Glucose  Recent Labs Lab 07/08/2017 1159 07/14/2017 1623 06/18/2017 2018 06/22/2017 2346 06/18/17 0751 06/18/17 0756  GLUCAP 131* 148* 131* 142* 67 70   Imaging Ct Soft Tissue Neck W Contrast  Result Date: 06/13/2017 CLINICAL DATA:  Followup parotiditis. EXAM: CT NECK WITH CONTRAST TECHNIQUE: Multidetector CT imaging of the neck was performed using the standard protocol following the bolus administration of intravenous contrast. CONTRAST:  75 cc Isovue-300 COMPARISON:  06/08/2017, Aug 22, 2017 FINDINGS: There is further worsening by imaging. Patient appears to have diffuse suppurative inflammation of the right parotid and submandibular glands. Diffuse cellulitis/abscess formation throughout the right face and deep spaces with specific findings as below. Low-density fluid, presumably abscess, superficial to the right parotid gland with thickness measuring up to 16 mm. Low-density fluid, presumably abscess, right periorbital preseptal region measuring up to 2 cm in thickness. More superficial subcutaneous  fluid in the right cheek measuring up to 17 mm in thickness. Right parapharyngeal space abscess much larger, measuring 3.5 x 2.7 cm and extending over a length of almost 6 cm, with mass effect upon the oropharynx and hypopharynx. Abscess within the right submandibular gland extending into the sublingual region. No sign of intracranial extension in the limited intracranial images. No sign of postseptal orbital inflammation on the limited images. Diffuse mucosal inflammation of the right maxillary sinus. No layering fluid. Some dental and periodontal disease, but without advanced periodontal disease/abscess of bone. IMPRESSION: Continued worsening of inflammatory disease of the right face and deep spaces. Most notable changes include enlargement of the right parapharyngeal abscess now measuring up to 3.5 cm in diameter and extending over a length of almost 6 cm with worsened mass effect upon the hypopharynx and pharynx. See above for description of other locations of involvement/extension. Electronically Signed   By: Paulina FusiMark  Shogry M.D.   On: 06/07/2017 09:17    STUDIES:  CT Neck 6/18 > Diffuse facial swelling favored to represent bacterial RIGHT parotitis. Significant facial cellulitis, with RIGHT muscle of mastication enlargement. No visible calculi. No drainable abscess. Minor dental caries and periapical lucencies, not felt to be the source of inflammation. CXR 6/22 > Low lung volumes with right basilar atelectasis. Upper normal heart size likely exaggerated by technique and low lung volumes. Left lung base and costophrenic angle not included in the field of view. Frontal and lateral views may be helpful if patient is able. CT neck 6/22 > Extensive severe multi compartmental infectious/inflammatory process involving the right face from the right periorbital soft tissues to the upper neck. Inflammation involves the all right-sided deep cervical compartments including right parapharyngeal and retropharyngeal  spaces. There multiple ill-defined fluid collections probably representing phlegmon. No discrete rim enhancing abscess is Identified. There is rightward displacement of the airway and moderate narrowing of the airway due to fluid collections in parapharyngeal space and swelling of oral and hypopharyngeal mucosa. The origin of infection is uncertain, possibly odontogenic given the presence of odontogenic disease. CT Neck 6/25 > Continued worsening of inflammatory disease of the right face and deep spaces. Most notable changes include enlargement of the right parapharyngeal abscess now measuring up to 3.5 cm in diameter and extending over a length of almost 6 cm with worsened mass effect upon the hypopharynx and pharynx.  6/27 CxR with ft needing advancement.  CULTURES: Blood 6/18 > No growth  MRSA PCR 6/18 > Negative  MRSA PCP 6/25 > Negative Staphy aureus 6/25 > Negative  Aerobic/Anareboic Culture 6/25 >> Kleb P. Res to ampicillin  6.28 BC>>>NGTD 7/1 OR>>>  ANTIBIOTICS: Per ID Clindamycin 6/18 > 6/23>>> Unasyn 6/18 > 6/22 Vancomycin 6/23 >>>6/28 unasyn 6/27>>>7/1 Zosyn 6/23 >>>6/27 merom 7/1>>>  IVIG 7/1>>>  SIGNIFICANT EVENTS: 6/18 > Presents to ED 6/25 > Taken to OR for I&D and Trach  6/29- fevers, vent 6/30- bleeding, blood, cauterized bedside 7/1 fib rvr, borderline bP, to OR  LINES/TUBES: Trach 6/25 >>  L femoral CVC 6/25 >>> Left cvc 7/1>>> picc 6/27>>> Aline ( in OR 7/1) >>>  DISCUSSION: 61 year old male presents to ED on 6/18 with right facial cellulitis/parotitis and parapharyngeal abscess s/p I&D and trach on 6/25. 6/28 lighten sedation and wean from vent as tolerated. Lantus stopped and D5w started as FT malpositioned and tube feeds stopped coupled with stopping steroids has led to hypoglycemia.  ASSESSMENT / PLAN:  PULMONARY A: Respiratory Insufficieny in setting of facial cellulitis/focal abscess s/p trach  Hypoxia 7/2 P:   pcxr worsening rt ,  likely atx, consider broch assessment as desat Peep to 10 Add chest pt q4h pcxr in am  No SBT I dont think is effusion, bronch would hell change management Lasix needed abg reviewed, slight increase rate 20 Repeat abg in 1 hour  CARDIOVASCULAR A:  HTN hyponatremia resolved Bleeding Borderline bP Fib rvr now SR P:  SR remains No neo Tele Neg balance goals  RENAL Lab Results  Component Value Date   CREATININE 1.30 (H) 06/18/2017   CREATININE 0.96 07/07/2017   CREATININE 0.72 06/15/2017   CREATININE 0.83 04/05/2015   CREATININE 0.76 07/28/2014   CREATININE 0.80 02/23/2014    Recent Labs Lab 07/06/2017 0850 06/28/2017 1010 06/18/17 0400  K 3.8 4.3 4.1     A:   hyponatremiam resolved, hypovolemia resolving ATN cvp 14  P:   kvo ensure Lasix to neg balance, follow crt trend  bmet  in am Dc LR with ARF  GASTROINTESTINAL A:   Hep C genotype 1 with splenomegaly - ? Untreated Protein energy malnutition P:   PPI Tf hold for bronch   HEMATOLOGIC A:   Thrombocytopenia > improving  Anemia with active wound bleeding x 2 days P:  Follow CBC in am , hope this drop WBC is NOT pancytopenia from overwhelming infection scd  INFECTIOUS A:   Right Acute Parotitis with extensive facial cellulitis and focal right parapharyngeal abscess s/p I&D with Klebsiella P. Res to ampicillin Elevated WBC coung  necrtozing? P:   OR in am likely Hold further IVIG with crt Maintain mero Follow new culture  ENDOCRINE CBG (last 3)   Recent Labs  06/18/2017 2346 06/18/17 0751 06/18/17 0756  GLUCAP 142* 67 70     A:   DM , at risk poor wound healing Drop in glu P:   SSI coverage to moderate lantus dc Dc TF coverage  NEUROLOGIC A:   Acute Encephalopathy secondary to sedation  pain P:   RASS goal: -2 PRN fentanyl   Need WUA If not improved may need CT head Obtain eeg   FAMILY  - Inter-disciplinary family meet or Palliative Care meeting due by:   06/18/17  Ccm time 30 min  Mcarthur Rossetti. Tyson Alias, MD, FACP Pgr: [phone] [facility]

## 2017-06-18 NOTE — Anesthesia Postprocedure Evaluation (Signed)
Anesthesia Post Note  Patient: Elam Lute  Procedure(s) Performed: Procedure(s) (LRB): Debridement of Right NECK and FACIAL Abscess, Incision and Drainage Right Temporal Abscess, and Drainage of right PARAPHARYNGEAL  ABSCESS (N/A)     Anesthesia Post Evaluation  Last Vitals:  Vitals:   06/18/17 0920 06/18/17 1000  BP: 115/64 (!) 103/57  Pulse: 98 91  Resp:  (!) 28  Temp:      Last Pain:  Vitals:   06/18/17 0759  TempSrc: Core (Comment)  PainSc:                  Dezhane Staten DAVID

## 2017-06-18 NOTE — Procedures (Signed)
Bronchoscopy Procedure Note [name] [name] [id number] 1956/12/09  Procedure: Bronchoscopy Indications: Diagnostic evaluation of the airways, Obtain specimens for culture and/or other diagnostic studies and Remove secretions  Procedure Details Consent: Risks of procedure as well as the alternatives and risks of each were explained to the (patient/caregiver).  Consent for procedure obtained. Time Out: Verified patient identification, verified procedure, site/side was marked, verified correct patient position, special equipment/implants available, medications/allergies/relevent history reviewed, required imaging and test results available.  Performed  In preparation for procedure, patient was given 100% FiO2 and bronchoscope lubricated. Sedation: on going prop  Airway entered and the following bronchi were examined: RUL, RML, RLL and Bronchi.   Procedures performed: Brushings performed - none Bronchoscope removed.  , Patient placed back on 100% FiO2 at conclusion of procedure.    Evaluation Hemodynamic Status: BP stable throughout; O2 sats: stable throughout Patient's Current Condition: stable Specimens:  Sent purulent fluid Complications: No apparent complications Patient did tolerate procedure well.   [name] [name]. 06/18/2017  1. Rt MAin collapse and BI, thick dark old bloody pus, all suctioned clear 2. No enter left to avoid contam 3. BAl old thick bloody pus rml  [name] [name]. [name] Mould, MD, [place] Pgr: [facility]

## 2017-06-18 NOTE — Addendum Note (Signed)
Addendum  created 06/18/17 1027 by Arta Brucessey, Camary Sosa, MD   Anesthesia Attestations deleted, Anesthesia Attestations filed

## 2017-06-18 NOTE — Addendum Note (Signed)
Addendum  created 06/18/17 1026 by Arta Brucessey, Deadrian Toya, MD   Sign clinical note

## 2017-06-18 NOTE — Progress Notes (Addendum)
Nutrition Follow-up  DOCUMENTATION CODES:   Not applicable  INTERVENTION:   Continue  Glucerna 1.2 at 45 ml/h (1080 ml per day)  Pro-stat 30 ml TID  Provides 1812 kcal (with propofol), 110 gm protein, 869 ml free water daily  NUTRITION DIAGNOSIS:   Inadequate oral intake related to inability to eat as evidenced by NPO status.  Ongoing  GOAL:   Patient will meet greater than or equal to 90% of their needs  Met with TF  MONITOR:   Vent status, TF tolerance, Labs, Weight trends, I & O's  ASSESSMENT:   61 yo male with PMH of DM, HTN, Hepatitis C who was admitted on 6/18 with facial swelling, dyspnea, and dysphagia related to R parotitis and facial cellulitis.  Discussed patient in ICU rounds and with RN today. S/P trach 6/25. S/P wound exploration, debridement of neck, and wound packing on 7/1. Patient is currently receiving Glucerna 1.2 via Cortrak tube at 45 ml/h (1080 ml/day) with Prostat 30 ml TID to provide 1596 kcals, 110 gm protein, 869 ml free water daily.  Total intake 1812 kcals with TF & propofol. Patient is currently intubated on ventilator support MV: 8.4 L/min Temp (24hrs), Avg:100.6 F (38.1 C), Min:93.8 F (34.3 C), Max:103.1 F (39.5 C)  Propofol: 8.2 ml/hr providing 216 kcal from lipid. Labs and medications reviewed.  Diet Order:   NPO  Skin:  Wound (see comment) (facial abscesses)  Last BM:  7/2  Height:   Ht Readings from Last 1 Encounters:  06/09/17 5' (1.524 m)    Weight:   Wt Readings from Last 1 Encounters:  06/18/17 174 lb 2.6 oz (79 kg)   06/12/17 153 lb 7 oz (69.6 kg)   06/06/17 144 lb 13.5 oz (65.7 kg)    Ideal Body Weight:  48.2 kg  BMI:  28.3 (using admission weight)  Estimated Nutritional Needs:   Kcal:  1915  Protein:  105-115 gm  Fluid:  1.8 L  EDUCATION NEEDS:   No education needs identified at this time  Molli Barrows, [place], [place], [place] Pager [phone] After Hours Pager [phone]

## 2017-06-19 ENCOUNTER — Inpatient Hospital Stay (HOSPITAL_COMMUNITY): Payer: 59

## 2017-06-19 DIAGNOSIS — L0211 Cutaneous abscess of neck: Secondary | ICD-10-CM

## 2017-06-19 DIAGNOSIS — L0201 Cutaneous abscess of face: Secondary | ICD-10-CM

## 2017-06-19 DIAGNOSIS — L02811 Cutaneous abscess of head [any part, except face]: Secondary | ICD-10-CM

## 2017-06-19 LAB — BPAM RBC
BLOOD PRODUCT EXPIRATION DATE: 201807182359
BLOOD PRODUCT EXPIRATION DATE: 201807182359
BLOOD PRODUCT EXPIRATION DATE: 201807212359
Blood Product Expiration Date: 201807172359
Blood Product Expiration Date: 201807182359
Blood Product Expiration Date: 201807182359
Blood Product Expiration Date: 201807212359
Blood Product Expiration Date: 201807212359
ISSUE DATE / TIME: 201806292204
ISSUE DATE / TIME: 201806300618
ISSUE DATE / TIME: 201806301949
ISSUE DATE / TIME: 201806302147
ISSUE DATE / TIME: 201807010755
ISSUE DATE / TIME: 201807010755
ISSUE DATE / TIME: 201807010755
ISSUE DATE / TIME: 201807010755
UNIT TYPE AND RH: 7300
UNIT TYPE AND RH: 7300
UNIT TYPE AND RH: 7300
Unit Type and Rh: 7300
Unit Type and Rh: 7300
Unit Type and Rh: 7300
Unit Type and Rh: 7300
Unit Type and Rh: 7300

## 2017-06-19 LAB — TYPE AND SCREEN
ABO/RH(D): B POS
Antibody Screen: NEGATIVE
UNIT DIVISION: 0
UNIT DIVISION: 0
UNIT DIVISION: 0
UNIT DIVISION: 0
UNIT DIVISION: 0
Unit division: 0
Unit division: 0
Unit division: 0

## 2017-06-19 LAB — GLUCOSE, CAPILLARY
GLUCOSE-CAPILLARY: 210 mg/dL — AB (ref 65–99)
GLUCOSE-CAPILLARY: 241 mg/dL — AB (ref 65–99)
Glucose-Capillary: 91 mg/dL (ref 65–99)
Glucose-Capillary: 141 mg/dL — ABNORMAL HIGH (ref 65–99)
Glucose-Capillary: 160 mg/dL — ABNORMAL HIGH (ref 65–99)
Glucose-Capillary: 214 mg/dL — ABNORMAL HIGH (ref 65–99)
Glucose-Capillary: 195 mg/dL — ABNORMAL HIGH (ref 65–99)

## 2017-06-19 LAB — BASIC METABOLIC PANEL
Anion gap: 11 (ref 5–15)
BUN: 46 mg/dL — ABNORMAL HIGH (ref 6–20)
CHLORIDE: 104 mmol/L (ref 101–111)
CO2: 25 mmol/L (ref 22–32)
Calcium: 6.8 mg/dL — ABNORMAL LOW (ref 8.9–10.3)
Creatinine, Ser: 1.57 mg/dL — ABNORMAL HIGH (ref 0.61–1.24)
GFR calc non Af Amer: 47 mL/min — ABNORMAL LOW (ref >60)
GFR, EST AFRICAN AMERICAN: 54 mL/min — AB (ref >60)
Glucose, Bld: 151 mg/dL — ABNORMAL HIGH (ref 65–99)
POTASSIUM: 4 mmol/L (ref 3.5–5.1)
SODIUM: 140 mmol/L (ref 135–145)

## 2017-06-19 LAB — CBC
HCT: 23.5 % — ABNORMAL LOW (ref 39.0–52.0)
HEMOGLOBIN: 7.8 g/dL — AB (ref 13.0–17.0)
MCH: 28 pg (ref 26.0–34.0)
MCHC: 33.2 g/dL (ref 30.0–36.0)
MCV: 84.2 fL (ref 78.0–100.0)
Platelets: 89 10*3/uL — ABNORMAL LOW (ref 150–400)
RBC: 2.79 MIL/uL — AB (ref 4.22–5.81)
RDW: 16.9 % — ABNORMAL HIGH (ref 11.5–15.5)
WBC: 18 10*3/uL — ABNORMAL HIGH (ref 4.0–10.5)

## 2017-06-19 LAB — CULTURE, BLOOD (ROUTINE X 2)
Culture: NO GROWTH
Culture: NO GROWTH
Special Requests: ADEQUATE
Special Requests: ADEQUATE

## 2017-06-19 LAB — MAGNESIUM: MAGNESIUM: 2.1 mg/dL (ref 1.7–2.4)

## 2017-06-19 LAB — PHOSPHORUS: PHOSPHORUS: 7.4 mg/dL — AB (ref 2.5–4.6)

## 2017-06-19 MED ORDER — AMIODARONE IV BOLUS ONLY 150 MG/100ML: 150.0000 mg | Freq: Once | INTRAVENOUS | Status: DC

## 2017-06-19 MED ORDER — DEXTROSE 5 % IV SOLN
1.0000 g | Freq: Two times a day (BID) | INTRAVENOUS | Status: DC
  Administered 2017-06-19 – 2017-06-21 (×4): 1 g via INTRAVENOUS
  Filled 2017-06-19 (×5): qty 1

## 2017-06-19 MED ORDER — AMIODARONE LOAD VIA INFUSION
150.0000 mg | Freq: Once | INTRAVENOUS | Status: AC
  Administered 2017-06-19: 150 mg via INTRAVENOUS
  Filled 2017-06-19: qty 83.34

## 2017-06-19 MED ORDER — METOPROLOL TARTRATE 5 MG/5ML IV SOLN
2.5000 mg | INTRAVENOUS | Status: DC | PRN
  Administered 2017-06-22: 2.5 mg via INTRAVENOUS
  Filled 2017-06-19: qty 5

## 2017-06-19 MED ORDER — SODIUM CHLORIDE 0.9 % IV SOLN
1.0000 g | Freq: Two times a day (BID) | INTRAVENOUS | Status: DC
  Filled 2017-06-19: qty 1

## 2017-06-19 NOTE — Progress Notes (Signed)
PULMONARY / CRITICAL CARE MEDICINE   Name: [name] [name] MRN: [id number] DOB: [birth year]    ADMISSION DATE:  06/03/2017 CONSULTATION DATE:  06/08/2017  REFERRING MD:  Dr. Thereasa Solo  CHIEF COMPLAINT:  Right parapharyngeal and neck abscesses  HISTORY OF PRESENT ILLNESS:  61 neck abscess, I and D, trach, vent   SUBJECTIVE:  7/2 woke up was appropriate 7/2- collapse lung, bronch 7/2- improved clinically infection Lasix neg 2 liters   VITAL SIGNS: BP (!) 122/54   Pulse 95   Temp 99.9 F (37.7 C) (Oral)   Resp (!) 27   Ht 5' (1.524 m)   Wt 76 kg (167 lb 8.8 oz)   SpO2 97%   BMI 32.72 kg/m   HEMODYNAMICS: CVP:  [8 mmHg-14 mmHg] 8 mmHg  VENTILATOR SETTINGS: Vent Mode: PRVC FiO2 (%):  [50 %] 50 % Set Rate:  [20 bmp] 20 bmp Vt Set:  [400 mL] 400 mL PEEP:  [10 cmH20] 10 cmH20 Plateau Pressure:  [23 cmH20-26 cmH20] 23 cmH20  INTAKE / OUTPUT: I/O last 3 completed shifts: In: 4001.7 [I.V.:2156.7; NG/GT:1395; IV Piggyback:450] Out: 2952 [WUXLK:4401; Stool:300]  PHYSICAL EXAMINATION: General: rass -3 Neuro: was grimacing on WUA HEENT:perr, swolen PULM: rt coarse midl CV: s1 s2 RRT no  GI: soft, bs wnl, no r Extremities: edema min  legs       LABS:  BMET  Recent Labs Lab 07/01/2017 0424  07/02/2017 1010 06/18/17 0400 06/18/17 1230 06/19/17 0345  NA 142  < > 147* 136  --  140  K 3.9  < > 4.3 4.1 4.0 4.0  CL 109  --   --  107  --  104  CO2 26  --   --  25  --  25  BUN 19  --   --  30*  --  46*  CREATININE 0.96  --   --  1.30*  --  1.57*  GLUCOSE 199*  < > 137* 115*  --  151*  < > = values in this interval not displayed.  Electrolytes  Recent Labs Lab 06/15/17 0330 07/05/2017 0424 06/26/2017 0436 06/18/17 0400 06/18/17 1230 06/19/17 0345  CALCIUM 7.1* 7.1*  --  6.5*  --  6.8*  MG 1.7  --  2.1  --  1.9 2.1  PHOS 4.2  --   --   --  6.9* 7.4*    CBC  Recent Labs Lab 06/29/2017 1715 06/18/17 0400 06/19/17 0345  WBC 31.6* 11.9* 18.0*  HGB 11.5* 10.2*  7.8*  HCT 34.2* 31.0* 23.5*  PLT 122* 77* 89*    Coag's  Recent Labs Lab 06/15/17 1125 06/16/17 0407  APTT 53*  --   INR 1.40 1.30    Sepsis Markers  Recent Labs Lab 06/12/17 2324 06/13/17 1230  LATICACIDVEN 2.9* 2.2*    ABG  Recent Labs Lab 07/04/2017 0841 06/18/17 0335 06/18/17 1208  PHART 7.425 7.334* 7.330*  PCO2ART 44.6 46.2 49.5*  PO2ART 384.0* 73.7* 71.0*    Liver Enzymes  Recent Labs Lab 06/16/17 0407 07/14/2017 0424 06/18/17 0400  AST 68* 53* 65*  ALT 31 21 15*  ALKPHOS 104 108 107  BILITOT 2.5* 2.4* 1.5*  ALBUMIN <1.0* <1.0* <1.0*    Cardiac Enzymes No results for input(s): TROPONINI, PROBNP in the last 168 hours.  Glucose  Recent Labs Lab 06/18/17 1014 06/18/17 1204 06/18/17 2021 06/18/17 2341 06/19/17 0329 06/19/17 0800  GLUCAP 74 69 113* 112* 141* 160*   Imaging Ct Soft Tissue Neck W  Contrast  Result Date: 05/18/2017 CLINICAL DATA:  Followup parotiditis. EXAM: CT NECK WITH CONTRAST TECHNIQUE: Multidetector CT imaging of the neck was performed using the standard protocol following the bolus administration of intravenous contrast. CONTRAST:  75 cc Isovue-300 COMPARISON:  06/08/2017, 06/02/2017 FINDINGS: There is further worsening by imaging. Patient appears to have diffuse suppurative inflammation of the right parotid and submandibular glands. Diffuse cellulitis/abscess formation throughout the right face and deep spaces with specific findings as below. Low-density fluid, presumably abscess, superficial to the right parotid gland with thickness measuring up to 16 mm. Low-density fluid, presumably abscess, right periorbital preseptal region measuring up to 2 cm in thickness. More superficial subcutaneous fluid in the right cheek measuring up to 17 mm in thickness. Right parapharyngeal space abscess much larger, measuring 3.5 x 2.7 cm and extending over a length of almost 6 cm, with mass effect upon the oropharynx and hypopharynx. Abscess  within the right submandibular gland extending into the sublingual region. No sign of intracranial extension in the limited intracranial images. No sign of postseptal orbital inflammation on the limited images. Diffuse mucosal inflammation of the right maxillary sinus. No layering fluid. Some dental and periodontal disease, but without advanced periodontal disease/abscess of bone. IMPRESSION: Continued worsening of inflammatory disease of the right face and deep spaces. Most notable changes include enlargement of the right parapharyngeal abscess now measuring up to 3.5 cm in diameter and extending over a length of almost 6 cm with worsened mass effect upon the hypopharynx and pharynx. See above for description of other locations of involvement/extension. Electronically Signed   By: Nelson Chimes M.D.   On: 06/02/2017 09:17    STUDIES:  CT Neck 6/18 > Diffuse facial swelling favored to represent bacterial RIGHT parotitis. Significant facial cellulitis, with RIGHT muscle of mastication enlargement. No visible calculi. No drainable abscess. Minor dental caries and periapical lucencies, not felt to be the source of inflammation. CXR 6/22 > Low lung volumes with right basilar atelectasis. Upper normal heart size likely exaggerated by technique and low lung volumes. Left lung base and costophrenic angle not included in the field of view. Frontal and lateral views may be helpful if patient is able. CT neck 6/22 > Extensive severe multi compartmental infectious/inflammatory process involving the right face from the right periorbital soft tissues to the upper neck. Inflammation involves the all right-sided deep cervical compartments including right parapharyngeal and retropharyngeal spaces. There multiple ill-defined fluid collections probably representing phlegmon. No discrete rim enhancing abscess is Identified. There is rightward displacement of the airway and moderate narrowing of the airway due to fluid  collections in parapharyngeal space and swelling of oral and hypopharyngeal mucosa. The origin of infection is uncertain, possibly odontogenic given the presence of odontogenic disease. CT Neck 6/25 > Continued worsening of inflammatory disease of the right face and deep spaces. Most notable changes include enlargement of the right parapharyngeal abscess now measuring up to 3.5 cm in diameter and extending over a length of almost 6 cm with worsened mass effect upon the hypopharynx and pharynx.  6/27 CxR with ft needing advancement.  CULTURES: Blood 6/18 > No growth  MRSA PCR 6/18 > Negative  MRSA PCP 6/25 > Negative Staphy aureus 6/25 > Negative  Aerobic/Anareboic Culture 6/25 >> Kleb P. Res to ampicillin  6.28 BC>>>NGTD 7/1 OR>>>klieb>>> sens pending Bronch bal 7/2>>>stain gram neg rod>>>  ANTIBIOTICS: Per ID Clindamycin 6/18 > 6/23>>> Unasyn 6/18 > 6/22 Vancomycin 6/23 >>>6/28 unasyn 6/27>>>7/1 Zosyn 6/23 >>>6/27 merom 7/1>>>  IVIG  7/1>>> x 1, further held crt rise  SIGNIFICANT EVENTS: 6/18 > Presents to ED 6/25 > Taken to OR for I&D and Trach  6/29- fevers, vent 6/30- bleeding, blood, cauterized bedside 7/1 fib rvr, borderline bP, to OR  LINES/TUBES: Trach 6/25 >>  L femoral CVC 6/25 >>> Left cvc 7/1>>> picc 6/27>>> Aline ( in OR 7/1) >>>  DISCUSSION: 61 year old male presents to ED on 6/18 with right facial cellulitis/parotitis and parapharyngeal abscess s/p I&D and trach on 6/25. 6/28 lighten sedation and wean from vent as tolerated. Lantus stopped and D5w started as FT malpositioned and tube feeds stopped coupled with stopping steroids has led to hypoglycemia.  ASSESSMENT / PLAN:  PULMONARY A: Respiratory Insufficieny in setting of facial cellulitis/focal abscess s/p trach  Hypoxia 7/2- collapse rt main lung, s/p bronch 7/2 P:   pcxr now and in am  Peep likely can reduce in am  Chest pt rt Keep same mMV on rest Hold lasix  CARDIOVASCULAR A:   HTN hyponatremia resolved Bleeding Borderline bP Fib rvr now SR P:  SR remains Tele Even goals  RENAL Lab Results  Component Value Date   CREATININE 1.57 (H) 06/19/2017   CREATININE 1.30 (H) 06/18/2017   CREATININE 0.96 07/06/2017   CREATININE 0.83 04/05/2015   CREATININE 0.76 07/28/2014   CREATININE 0.80 02/23/2014    Recent Labs Lab 06/18/17 0400 06/18/17 1230 06/19/17 0345  K 4.1 4.0 4.0     A:   hyponatremiam resolved, hypovolemia resolving ATN cvp 14  P:   kvo ensure Lasix to neg balance, follow crt trend  bmet  in am Dc LR with ARF  GASTROINTESTINAL A:   Hep C genotype 1 with splenomegaly - ? Untreated Protein energy malnutition P:   PPI Tf re added Follow BM, noted  HEMATOLOGIC A:   Thrombocytopenia > sepsis Anemia with active wound bleeding x 2 days, resolved P:  Follow CBC in am Treating infection scd  INFECTIOUS A:   Right Acute Parotitis with extensive facial cellulitis and focal right parapharyngeal abscess s/p I&D with Klebsiella P. Res to ampicillin necrtozing P:   Hold further IVIG with crt rise Maintain mero, consider narrow after BAL noted Repeat OR trips per ENt to remove dead tissue per ENT assessment bedside dressing changes   ENDOCRINE CBG (last 3)   Recent Labs  06/18/17 2341 06/19/17 0329 06/19/17 0800  GLUCAP 112* 141* 160*     A:   DM , at risk poor wound healing Drop in glu resolved P:   SSI coverage to moderate  NEUROLOGIC A:   Acute Encephalopathy secondary to sedation  Pain severe, requiring limited WUA P:   RASS goal: -3 PRN fentanyl   Need WUA short then re sedate Tg repeat , may need to dc prop   FAMILY  - Inter-disciplinary family meet or Palliative Care meeting due by:  06/18/17- done DF , ENT  Ccm time 30 min  Lavon Paganini. Titus Mould, MD, [place] Pgr: [facility]

## 2017-06-19 NOTE — Progress Notes (Signed)
Patient ID: [name] [name], male   DOB: 11/15/1956, 61 y.o.   MRN: [id number]          [facility]  Date of Admission:  05/29/2017   Total days of antibiotics 16        Day 3 meropenem         ASSESSMENT: His head and neck and facial abscesses appear to be due to Klebsiella alone. I do not think that this is likely to be a mixed aerobic anaerobic infection. He may also have gram-negative rod pneumonia. I will change meropenem to ceftazidime pending BAL culture results.  PLAN: 1. Change meropenem to ceftazidime pending BAL culture results  Active Problems:   Acute parotitis   Facial cellulitis   Respiratory failure (HCC)   Acute chest pain   Status post tracheostomy ([place])   Parapharyngeal abscess   Klebsiella infection   Necrotizing fasciitis (HCC)   Chronic hepatitis C without hepatic coma ([place])   . chlorhexidine gluconate (MEDLINE KIT)  15 mL Mouth Rinse BID  . Chlorhexidine Gluconate Cloth  6 each Topical Daily  . feeding supplement (PRO-STAT SUGAR FREE 64)  30 mL Per Tube TID  . insulin aspart  2-6 Units Subcutaneous Q4H  . labetalol  10 mg Intravenous Once  . mouth rinse  15 mL Mouth Rinse 10 times per day  . pantoprazole sodium  40 mg Per Tube Daily  . sodium chloride flush  10-40 mL Intracatheter Q12H   Review of Systems: Review of Systems  Unable to perform ROS: Critical illness    No Known Allergies  OBJECTIVE: Vitals:   06/19/17 0759 06/19/17 0800 06/19/17 0803 06/19/17 0900  BP: (!) 122/54 (!) 113/58  (!) 105/50  Pulse: 95 98  61  Resp: (!) 27 (!) 25  (!) 30  Temp:   99.9 F (37.7 C)   TempSrc:   Oral   SpO2: 97% 97%  96%  Weight:      Height:       Body mass index is 32.72 kg/m.  Physical Exam  Constitutional:  Large gauze dressing over her head and face covering surgical wounds.  HENT:  Dr. Gloriann Loan note today indicates that wounds are looking better.  Cardiovascular: Normal rate and regular rhythm.   No murmur  heard. Pulmonary/Chest:  Rhonchi right greater than left.    Lab Results Lab Results  Component Value Date   WBC 18.0 (H) 06/19/2017   HGB 7.8 (L) 06/19/2017   HCT 23.5 (L) 06/19/2017   MCV 84.2 06/19/2017   PLT 89 (L) 06/19/2017    Lab Results  Component Value Date   CREATININE 1.57 (H) 06/19/2017   BUN 46 (H) 06/19/2017   NA 140 06/19/2017   K 4.0 06/19/2017   CL 104 06/19/2017   CO2 25 06/19/2017    Lab Results  Component Value Date   ALT 15 (L) 06/18/2017   AST 65 (H) 06/18/2017   ALKPHOS 107 06/18/2017   BILITOT 1.5 (H) 06/18/2017     Microbiology: Recent Results (from the past 240 hour(s))  Surgical pcr screen     Status: None   Collection Time: 05/31/2017 10:22 AM  Result Value Ref Range Status   MRSA, PCR NEGATIVE NEGATIVE Final   Staphylococcus aureus NEGATIVE NEGATIVE Final    Comment:        The Xpert SA Assay (FDA approved for NASAL specimens in patients over 65 years of age), is one component of a comprehensive surveillance program.  Test performance has been validated by [facility] for patients greater than or equal to 22 year old. It is not intended to diagnose infection nor to guide or monitor treatment.   Aerobic/Anaerobic Culture (surgical/deep wound)     Status: None   Collection Time: 05/30/2017  6:18 PM  Result Value Ref Range Status   Specimen Description ABSCESS RIGHT NECK  Final   Special Requests NONE  Final   Gram Stain   Final    MODERATE WBC PRESENT, PREDOMINANTLY PMN MODERATE GRAM NEGATIVE RODS    Culture   Final    MODERATE KLEBSIELLA PNEUMONIAE NO ANAEROBES ISOLATED    Report Status 06/16/2017 FINAL  Final   Organism ID, Bacteria KLEBSIELLA PNEUMONIAE  Final      Susceptibility   Klebsiella pneumoniae - MIC*    AMPICILLIN >=32 RESISTANT Resistant     CEFAZOLIN <=4 SENSITIVE Sensitive     CEFEPIME <=1 SENSITIVE Sensitive     CEFTAZIDIME <=1 SENSITIVE Sensitive     CEFTRIAXONE <=1 SENSITIVE Sensitive     CIPROFLOXACIN  <=0.25 SENSITIVE Sensitive     GENTAMICIN <=1 SENSITIVE Sensitive     IMIPENEM <=0.25 SENSITIVE Sensitive     TRIMETH/SULFA <=20 SENSITIVE Sensitive     AMPICILLIN/SULBACTAM 8 SENSITIVE Sensitive     PIP/TAZO <=4 SENSITIVE Sensitive     Extended ESBL NEGATIVE Sensitive     * MODERATE KLEBSIELLA PNEUMONIAE  MRSA PCR Screening     Status: None   Collection Time: 05/18/2017  8:30 PM  Result Value Ref Range Status   MRSA by PCR NEGATIVE NEGATIVE Final    Comment:        The GeneXpert MRSA Assay (FDA approved for NASAL specimens only), is one component of a comprehensive MRSA colonization surveillance program. It is not intended to diagnose MRSA infection nor to guide or monitor treatment for MRSA infections.   C difficile quick scan w PCR reflex     Status: None   Collection Time: 06/13/17  9:55 AM  Result Value Ref Range Status   C Diff antigen NEGATIVE NEGATIVE Final   C Diff toxin NEGATIVE NEGATIVE Final   C Diff interpretation No C. difficile detected.  Final  Culture, blood (Routine X 2) w Reflex to ID Panel     Status: None (Preliminary result)   Collection Time: 06/08/2017  3:15 PM  Result Value Ref Range Status   Specimen Description BLOOD LEFT ANTECUBITAL  Final   Special Requests   Final    BOTTLES DRAWN AEROBIC AND ANAEROBIC Blood Culture adequate volume   Culture NO GROWTH 4 DAYS  Final   Report Status PENDING  Incomplete  Culture, blood (Routine X 2) w Reflex to ID Panel     Status: None (Preliminary result)   Collection Time: 05/24/2017  3:15 PM  Result Value Ref Range Status   Specimen Description BLOOD BLOOD LEFT HAND  Final   Special Requests   Final    BOTTLES DRAWN AEROBIC AND ANAEROBIC Blood Culture adequate volume   Culture NO GROWTH 4 DAYS  Final   Report Status PENDING  Incomplete  Aerobic/Anaerobic Culture (surgical/deep wound)     Status: None (Preliminary result)   Collection Time: 07/14/2017  9:35 AM  Result Value Ref Range Status   Specimen Description  ABSCESS HEAD  Final   Special Requests RIGHT TEMPORAL FOSSA  Final   Gram Stain   Final    RARE WBC PRESENT, PREDOMINANTLY PMN FEW GRAM NEGATIVE RODS  Culture MODERATE KLEBSIELLA PNEUMONIAE  Final   Report Status PENDING  Incomplete  Culture, bal-quantitative     Status: Abnormal (Preliminary result)   Collection Time: 06/18/17 11:15 AM  Result Value Ref Range Status   Specimen Description BRONCHIAL ALVEOLAR LAVAGE  Final   Special Requests NONE  Final   Gram Stain   Final    MODERATE WBC PRESENT, PREDOMINANTLY PMN FEW GRAM NEGATIVE RODS    Culture >=100,000 COLONIES/mL GRAM NEGATIVE RODS (A)  Final   Report Status PENDING  Incomplete    Michel Bickers, MD [facility] [phone] pager   [phone] cell 06/19/2017, 11:22 AM

## 2017-06-19 NOTE — Progress Notes (Signed)
   Subjective:    Patient ID: [name] [name], male    DOB: 07-29-1956, 61 y.o.   MRN: [id number]  HPI Remains sedated on ventilator.  Review of Systems     Objective:   Physical Exam Tm 100.4 VSS Sedated Changed Kerlex packing in right temporal space, right lateral neck, maxilla, submental neck, left lateral neck, and right eyelids.  Deep tissues with no pus and minimal necrotic tissue.  Skin of right upper and lower eyelids and right neck absent. Trach site stable with cuff inflated.     Assessment & Plan:  Necrotizing fasciitis, abscesses of neck, parotid, temporalis, and eyelids  Wounds look as good as they have looked.  Dressings changed.  Will continue dressing changes and may not need return trip to operating room.  Continue broad spectrum antibiotics.

## 2017-06-19 NOTE — Progress Notes (Signed)
Pharmacy Antibiotic Note  [name] [name] is a 61 y.o. male admitted on 05/18/2017 with necrotizing abscess.  Pharmacy has been consulted for meropenem dosing. Tmax is 100.4 and WBC is down at 18, SCr rising at 1.57/ CrCl ~ 40. Holding Lasix today. Patient received IVIG x1 dose on 7/1. Clindamycin stopping today per Dr. Titus Mould.   Plan: Adjust Merrem to 1g IV every 12 hours F/u renal fxn, C&S, clinical status  Height: 5' (152.4 cm) Weight: 167 lb 8.8 oz (76 kg) IBW/kg (Calculated) : 50  Temp (24hrs), Avg:100 F (37.8 C), Min:99.4 F (37.4 C), Max:100.4 F (38 C)   Recent Labs Lab 06/12/17 0940 06/12/17 2324  06/13/17 1230 05/21/2017 0914 06/05/2017 0930 06/15/17 0330  06/16/17 1744 07/11/2017 0424 06/29/2017 1715 06/18/17 0400 06/19/17 0345  WBC  --   --   < >  --  29.9*  --  38.1*  < > 29.6* 32.6* 31.6* 11.9* 18.0*  CREATININE  --   --   < >  --  0.71  --  0.72  --   --  0.96  --  1.30* 1.57*  LATICACIDVEN  --  2.9*  --  2.2*  --   --   --   --   --   --   --   --   --   VANCOTROUGH 10*  --   --   --   --  20  --   --   --   --   --   --   --   < > = values in this interval not displayed.  Estimated Creatinine Clearance: 43.3 mL/min (A) (by C-G formula based on SCr of 1.57 mg/dL (H)).    No Known Allergies  Antimicrobials this admission: Meropenem 7/1>> 6/18 Unasyn >6/23; 6/27>>7/1 6/18 Clinda >6/23; 6/26>>7/3 6/23 Vanc>6/28 *6/26 VT=10 on 730m q12 > inc to 1g q8 (target closer to goal of 15) 6/23 Zosyn>>6/27  Dose adjustments this admission: 6/26 VT = 10 on vancomycin 750 mg q 12 hr, inc to q 8 hrs 6/28 VT = 20 on vancomycin 750 mg q 8 hrs > then vanc d/c'd 7/3 Dose adjusted Merrem to 1g IV q12 for rising SCr  Microbiology results: 6/18 blood cx: neg 6/18 HIV neg 6/18 MRSA neg 6/25 neck abscess > k. Pneumoniae - sens to all abx except amp 6/27 C. Diff neg 6/28 Blood - NGTD x4 days 7/1 Head abscess >> moderate K. pneumoniae 7/2  BAL >> (few GNR on stain)   Thank  you for allowing pharmacy to be a part of this patient's care.  JSloan Leiter PharmD, BCPS Clinical Pharmacist Clinical Phone 06/19/2017 until 3:30 PM - #[phone] hours, please call #[phone] 06/19/2017 8:42 AM

## 2017-06-19 NOTE — Progress Notes (Signed)
Pharmacy Antibiotic Note  [name] [name] is a 61 y.o. male admitted on 05/21/2017 with necrotizing head and neck abscess.  Pharmacy has been consulted for Ceftazidime dosing. SCr trending up. CrCl ~ 20-25 mL/min. Wound improving per ENT.  Plan: Change to Ceftazidime 1g IV every 12 hours. Monitor renal function and clinical status.   Height: 5' (152.4 cm) Weight: 167 lb 8.8 oz (76 kg) IBW/kg (Calculated) : 50  Temp (24hrs), Avg:100.1 F (37.8 C), Min:99.7 F (37.6 C), Max:100.4 F (38 C)   Recent Labs Lab 06/12/17 2324  06/13/17 1230 05/20/2017 0914 05/20/2017 0930 06/15/17 0330  06/16/17 1744 06/30/2017 0424 07/05/2017 1715 06/18/17 0400 06/19/17 0345  WBC  --   < >  --  29.9*  --  38.1*  < > 29.6* 32.6* 31.6* 11.9* 18.0*  CREATININE  --   < >  --  0.71  --  0.72  --   --  0.96  --  1.30* 1.57*  LATICACIDVEN 2.9*  --  2.2*  --   --   --   --   --   --   --   --   --   VANCOTROUGH  --   --   --   --  20  --   --   --   --   --   --   --   < > = values in this interval not displayed.  Estimated Creatinine Clearance: 43.3 mL/min (A) (by C-G formula based on SCr of 1.57 mg/dL (H)).    No Known Allergies  Antimicrobials this admission: Ceftaz 7/3>>  Meropenem 7/1>>7/3 6/18 Unasyn >6/23; 6/27>>7/1 6/18 Clinda >6/23; 6/26>>7/3 6/23 Vanc>6/28 *6/26 VT=10 on 763m q12 > inc to 1g q8 (target closer to goal of 15) 6/23 Zosyn>>6/27  Dose adjustments this admission: 6/26 VT = 10 on vancomycin 750 mg q 12 hr, inc to q 8 hrs 6/28 VT = 20 on vancomycin 750 mg q 8 hrs > then vanc d/c'd 7/3 Dose reduced Merrem to 1g q12h 7/3 Changed to Fortaz 1g IV q12h per ID   Microbiology results: 6/18 blood cx: neg 6/18 HIV neg 6/18 MRSA neg 6/25 neck abscess > k. Pneumoniae - sens to all abx except amp 6/27 C. Diff neg 6/28 Blood - NGTD x4 days 7/1 Head abscess >> moderate K. pneumoniae 7/2  BAL >> >100K Kleb pneumo, sens pending  Thank you for allowing pharmacy to be a part of this patient's  care.  MBrain Hilts7/02/2017 12:46 PM

## 2017-06-20 ENCOUNTER — Inpatient Hospital Stay (HOSPITAL_COMMUNITY): Payer: 59

## 2017-06-20 DIAGNOSIS — I4891 Unspecified atrial fibrillation: Secondary | ICD-10-CM

## 2017-06-20 DIAGNOSIS — E43 Unspecified severe protein-calorie malnutrition: Secondary | ICD-10-CM

## 2017-06-20 DIAGNOSIS — D62 Acute posthemorrhagic anemia: Secondary | ICD-10-CM

## 2017-06-20 LAB — COMPREHENSIVE METABOLIC PANEL
ALT: 22 U/L (ref 17–63)
ANION GAP: 11 (ref 5–15)
AST: 124 U/L — ABNORMAL HIGH (ref 15–41)
Albumin: <1 g/dL — ABNORMAL LOW (ref 3.5–5.0)
Alkaline Phosphatase: 219 U/L — ABNORMAL HIGH (ref 38–126)
BUN: 66 mg/dL — ABNORMAL HIGH (ref 6–20)
CHLORIDE: 104 mmol/L (ref 101–111)
CO2: 26 mmol/L (ref 22–32)
CREATININE: 1.83 mg/dL — AB (ref 0.61–1.24)
Calcium: 7 mg/dL — ABNORMAL LOW (ref 8.9–10.3)
GFR, EST AFRICAN AMERICAN: 45 mL/min — AB (ref >60)
GFR, EST NON AFRICAN AMERICAN: 39 mL/min — AB (ref >60)
Glucose, Bld: 218 mg/dL — ABNORMAL HIGH (ref 65–99)
POTASSIUM: 3.3 mmol/L — AB (ref 3.5–5.1)
SODIUM: 141 mmol/L (ref 135–145)
Total Bilirubin: 3.6 mg/dL — ABNORMAL HIGH (ref 0.3–1.2)
Total Protein: 6.4 g/dL — ABNORMAL LOW (ref 6.5–8.1)

## 2017-06-20 LAB — GLUCOSE, CAPILLARY
GLUCOSE-CAPILLARY: 179 mg/dL — AB (ref 65–99)
GLUCOSE-CAPILLARY: 200 mg/dL — AB (ref 65–99)
GLUCOSE-CAPILLARY: 239 mg/dL — AB (ref 65–99)
Glucose-Capillary: 194 mg/dL — ABNORMAL HIGH (ref 65–99)
Glucose-Capillary: 222 mg/dL — ABNORMAL HIGH (ref 65–99)
Glucose-Capillary: 197 mg/dL — ABNORMAL HIGH (ref 65–99)

## 2017-06-20 LAB — CULTURE, BAL-QUANTITATIVE W GRAM STAIN: Culture: >=100000 — AB

## 2017-06-20 LAB — CBC WITH DIFFERENTIAL/PLATELET
Basophils Absolute: 0 10*3/uL (ref 0.0–0.1)
Basophils Relative: 0 %
EOS PCT: 0 %
Eosinophils Absolute: 0 10*3/uL (ref 0.0–0.7)
HEMATOCRIT: 23.8 % — AB (ref 39.0–52.0)
Hemoglobin: 7.8 g/dL — ABNORMAL LOW (ref 13.0–17.0)
LYMPHS PCT: 5 %
Lymphs Abs: 0.6 10*3/uL — ABNORMAL LOW (ref 0.7–4.0)
MCH: 28 pg (ref 26.0–34.0)
MCHC: 32.8 g/dL (ref 30.0–36.0)
MCV: 85.3 fL (ref 78.0–100.0)
MONO ABS: 0.6 10*3/uL (ref 0.1–1.0)
MONOS PCT: 5 %
NEUTROS PCT: 90 %
Neutro Abs: 11.6 10*3/uL — ABNORMAL HIGH (ref 1.7–7.7)
Platelets: 83 10*3/uL — ABNORMAL LOW (ref 150–400)
RBC: 2.79 MIL/uL — AB (ref 4.22–5.81)
RDW: 17 % — AB (ref 11.5–15.5)
WBC: 12.8 10*3/uL — AB (ref 4.0–10.5)

## 2017-06-20 LAB — CULTURE, BAL-QUANTITATIVE

## 2017-06-20 LAB — TRIGLYCERIDES: Triglycerides: 612 mg/dL — ABNORMAL HIGH (ref <150)

## 2017-06-20 MED ORDER — HEPARIN SODIUM (PORCINE) 5000 UNIT/ML IJ SOLN: 5000.0000 [IU] | Freq: Three times a day (TID) | INTRAMUSCULAR | Status: DC

## 2017-06-20 MED ORDER — HEPARIN SODIUM (PORCINE) 5000 UNIT/ML IJ SOLN
5000.0000 [IU] | Freq: Three times a day (TID) | INTRAMUSCULAR | Status: DC
  Administered 2017-06-20 – 2017-06-24 (×11): 5000 [IU] via SUBCUTANEOUS
  Filled 2017-06-20 (×13): qty 1

## 2017-06-20 MED ORDER — BACITRACIN ZINC 500 UNIT/GM EX OINT
1.0000 "application " | TOPICAL_OINTMENT | Freq: Three times a day (TID) | CUTANEOUS | Status: DC
  Administered 2017-06-20 – 2017-06-21 (×2): 1 via TOPICAL
  Filled 2017-06-20 (×2): qty 28.35

## 2017-06-20 MED ORDER — DEXTROSE-NACL 5-0.45 % IV SOLN
INTRAVENOUS | Status: DC
  Administered 2017-06-23: 15:00:00 via INTRAVENOUS
  Filled 2017-06-20: qty 1000

## 2017-06-20 MED ORDER — DEXTROSE-NACL 5-0.45 % IV SOLN: INTRAVENOUS | Status: DC

## 2017-06-20 MED ORDER — POTASSIUM CHLORIDE 20 MEQ/15ML (10%) PO SOLN
40.0000 meq | Freq: Once | ORAL | Status: AC
  Administered 2017-06-20: 40 meq
  Filled 2017-06-20: qty 30

## 2017-06-20 MED ORDER — IBUPROFEN 100 MG/5ML PO SUSP: 400.0000 mg | Freq: Four times a day (QID) | ORAL | Status: DC | PRN

## 2017-06-20 MED ORDER — IBUPROFEN 100 MG/5ML PO SUSP
400.0000 mg | Freq: Four times a day (QID) | ORAL | Status: DC | PRN
  Filled 2017-06-20: qty 20

## 2017-06-20 NOTE — Progress Notes (Signed)
   Subjective:    Patient ID: Caven Ciriello, male    DOB: 20-Mar-1956, [age] y.o.   MRN: [id number]  HPI Remains sedated on ventilator.  Review of Systems     Objective:   Physical Exam Tm 101 VSS Sedated. Changed packing in various sites.  No purulent or liquefactive tissue.  Skin edges and deeper tissue edges with small areas of black tissue.  The majority of deep tissues red with areas of bleeding.  Repacked right temporal, upper and lower eyelids, right lateral neck, premaxilla, submental neck, and left neck.     Assessment & Plan:  Right and left neck, right parotid, right eyelids, and right temporal abscess/necrotizing fasciitis.  Fever curve and WBC improving.  Deep tissues look better.  Will continue daily packing changes.  When infection fully controlled, will need to consider reconstruction of eyelids and right buccal regions.  Temporal and neck sites likely will heal without needing reconstruction.

## 2017-06-20 NOTE — Progress Notes (Signed)
[facility] Attending Note  ADMISSION DATE:  06/09/2017  CONSULTATION DATE:  05/24/2017  REFERRING MD:  Dr. Thereasa Solo  CHIEF COMPLAINT:  Right parapharyngeal and neck abscesses  Presenting HPI:  61 y.o. male with PMH as below, which is significant for DM, HTN, and Hepatitis C. He was in his usual state of health until Friday 6/15 when he noticed some minor R sided facial swelling. This continued to worsen in the following days and he developed associated fevers/chills, dyspnea, and dysphagia. He presented to urgent care 6/17 and was prescribed Clindamycin. He took his first dose, but was unable to take any subsequent doses due to dysphagia. He presented to [facility] ED 6/18 for this complaint. He had CT scan done which demonstrated right parotitis and facial cellulitis. Due to concern for airway compromise, PCCM asked to evaluate for admission.   Subjective:  No acute events overnight. Dressing/packing changed this morning.   Review of Systems:  Unable to obtain given intubation & sedation.   Vent Mode: PRVC FiO2 (%):  [50 %] 50 % Set Rate:  [20 bmp] 20 bmp Vt Set:  [400 mL-440 mL] 440 mL PEEP:  [10 cmH20] 10 cmH20 Plateau Pressure:  [25 cmH20-29 cmH20] 25 cmH20  Temp:  [99.9 F (37.7 C)-101 F (38.3 C)] 99.9 F (37.7 C) (07/04 0748) Pulse Rate:  [78-123] 88 (07/04 1000) Resp:  [13-31] 19 (07/04 1000) BP: (97-153)/(60-82) 135/65 (07/04 1000) SpO2:  [95 %-99 %] 96 % (07/04 1000) Arterial Line BP: (105-163)/(43-68) 125/43 (07/04 0800) FiO2 (%):  [50 %] 50 % (07/04 0909) Weight:  [173 lb 15.1 oz (78.9 kg)] 173 lb 15.1 oz (78.9 kg) (07/04 0428)  General:  Family at bedside. Intubated. No distress. Integument:  Warm & dry. No rash on exposed skin. HEENT:  Moist mucus memebranes. Endotracheal tube in place. Swelling around right eye and face. Neurological:  Pupils symmetric. Sedated. No withdrawal to pain. No spontaneous movements. Musculoskeletal:  No joint  effusion or erythema appreciated. Symmetric muscle bulk. Pulmonary:  Symmetric chest wall rise on ventilator. Coarse breath sounds bilaterally. Cardiovascular:  Regular rate. No appreciable JVD. Normal S1 & S2. Abdomen:  Soft. Protuberant. Normoactive bowel sounds.  LINES/TUBES: Trach 8.0 6/25 (in OR) >>> L FEM CVL 7/1 >>> RUE DL PICC 6/27 >>> FEM ART LINE 7/1 >>> Foley 6/25 >>> NGT >>> PIV  CBC Latest Ref Rng & Units 06/20/2017 06/19/2017 06/18/2017  WBC 4.0 - 10.5 K/uL 12.8(H) 18.0(H) 11.9(H)  Hemoglobin 13.0 - 17.0 g/dL 7.8(L) 7.8(L) 10.2(L)  Hematocrit 39.0 - 52.0 % 23.8(L) 23.5(L) 31.0(L)  Platelets 150 - 400 K/uL 83(L) 89(L) 77(L)   BMP Latest Ref Rng & Units 06/20/2017 06/19/2017 06/18/2017  Glucose 65 - 99 mg/dL 218(H) 151(H) -  BUN 6 - 20 mg/dL 66(H) 46(H) -  Creatinine 0.61 - 1.24 mg/dL 1.83(H) 1.57(H) -  Sodium 135 - 145 mmol/L 141 140 -  Potassium 3.5 - 5.1 mmol/L 3.3(L) 4.0 4.0  Chloride 101 - 111 mmol/L 104 104 -  CO2 22 - 32 mmol/L 26 25 -  Calcium 8.9 - 10.3 mg/dL 7.0(L) 6.8(L) -    Hepatic Function Latest Ref Rng & Units 06/20/2017 06/18/2017 07/02/2017  Total Protein 6.5 - 8.1 g/dL 6.4(L) 6.8 4.6(L)  Albumin 3.5 - 5.0 g/dL <1.0(L) <1.0(L) <1.0(L)  AST 15 - 41 U/L 124(H) 65(H) 53(H)  ALT 17 - 63 U/L 22 15(L) 21  Alk Phosphatase 38 - 126 U/L 219(H) 107 108  Total Bilirubin 0.3 -  1.2 mg/dL 3.6(H) 1.5(H) 2.4(H)  Bilirubin, Direct 0.1 - 0.5 mg/dL - - -    IMAGING/STUDIES: CT NECK/SOFT TISSUE W/ CONTRAST 6/18:  Diffuse facial swelling favored to represent bacterial RIGHT parotitis. Significant facial cellulitis, with RIGHT muscle of mastication enlargement. No visible calculi. No drainable abscess. Minor dental caries and periapical lucencies, not felt to be the source of inflammation. CT NECK/SOFT TISSUE W/ CONTRAST 6/28: IMPRESSION: 1. Interval debridement of right periorbital, right facial subcutaneous, and right paramandibular fluid collections with multiple drains in situ.  Debrided collections are much decreased in size in comparison with the prior study.  2. Stable large right parapharyngeal abscess measuring up to 6 mm craniocaudal. 3. Increased effacement of the pharyngeal airway, now complete, due to mass effect and debris. 4. Interval worsening of paranasal sinus and mastoid disease with fluid levels probably due to poor drainage due to airway effacement. 5. Large abscess within right temporalis muscle in the right lateral scalp, not included within the field of view on prior study. 6. Stable abscess within the right sublingual space and right submandibular gland. 7. Stable extensive suppurative change within right parotid and submandibular glands. PORT CXR 7/4:  Personally reviewed by me. Tracheostomy in place. Slight improvement in aeration of right lung base. Persistent silhouetting of bilateral hemidiaphragms. Right upper extremity PICC line in place.  MICROBIOLOGY: MRSA PCR 6/18:  Negative  HIV 6/18:  Nonreactive  Blood Cultures x2 6/18:  Negative MRSA PCR 6/25:  Negative  Neck Abscess Culture 6/25:  Klebsiella pneumoniae Hepatitis C Antibody 6/21:  >11.0 Stool C diff 6/27:  Negative  Blood Cultures x2 6/28:  Negative  Neck Abscess/Surgical Culture 7/1:  Klebsiella pneumoniae  Bronchoalveolar Lavage/Wash 7/2:  Klebsiella pneumoniae   ANTIBIOTICS: IVIG 7/1 (x1 dose) Unasyn 6/18 - 6/23; 6/27 - 7/1 Zosyn 6/23 - 6/27 Vancomycin 6/23 - 6/28 Clindamycin 6/18 - 7/3 Merrem 7/1 - 7/3 Fortaz 7/3 >>>  SIGNIFICANT EVENTS: 06/18 - Admit after presenting to ED 06/25 - Taken to OR for I&D as well as Tracheostomy 06/29 - Fevers 06/30 - Bleeding  & underwent bedside cauterization 07/01 - Atrial fibrillation w/ RVR & borderline BP>>started on Amiodarone. Taken to OR again. 07/02 - Bronchoscopy w/ collapsed lung & mucus plugging  ASSESSMENT/PLAN:  61 y.o. male with sepsis secondary to severe cellulitis/abscess of his face/neck status post  debridement.  1. Severe sepsis: Secondary to facial cellulitis/abscess. Status post incision and drainage. Continuing antibiotics as per ID recommendations. Awaiting finalization of bronchoscopy culture. 2. Acute hypoxic respiratory failure: Multifactorial with subsequent mucus plugging. Continuing recruitment with PEEP. Holding on further diuresis given worsening renal function. 3. Facial cellulitis/abscess: Packing changed today by ENT. Antibiotics as above. Further debridement as per ENT. 4. Klebsiella pneumonia: Awaiting sensitivities from home. Continuing empiric antibiotics. 5. Hypokalemia: Replacing with KCl 40 mEq via tube 1. 6. Acute renal failure: Worsening. Previously was undergoing diuresis. Trending urine output with Foley catheter. Holding on further diuresis. Monitoring renal function and electrolytes daily. 7. Diabetes mellitus: Continuing Accu-Cheks every 4 hours with sliding scale insulin per moderate algorithm. 8. Atrial fibrillation: Converted to normal sinus rhythm on amiodarone drip. Continuing amiodarone. Monitoring patient on telemetry. 9. Anemia: Secondary to bleeding. Hemoglobin stable. Trending cell counts daily with CBC. 10. Thrombocytopenia: Likely consumption versus flexible sigmoidoscopy sequestration. Stable counts. Trending cell counts daily with CBC. 11. Hyponatremia: Resolved. 12. Hepatitis C genotype 1: Splenomegaly. Untreated. 13. Severe protein-calorie malnutrition: Continuing tube feeds.  Prophylaxis:  SCDs & Protonix via tube daily.  Diet:  NPO. Continuing tube feedings. Code Status:  Full Code per previous physician discussions. Disposition:  Remains critically ill in the ICU.  Family Update:  Family member updated by me and ENT at the time of my rounds.   I have personally spent a total of 32 minutes of critical care time today caring for the patient, updating family at bedside, & reviewing the patient's electronic medical record.  Sonia Baller  Ashok Cordia, M.D. [facility] Pager:  [phone] After 3pm or if no response, call [phone] 11:05 AM 06/20/17

## 2017-06-20 NOTE — Progress Notes (Signed)
Patient ID: [name] [name], male   DOB: [birth year], 61 y.o.   MRN: [id number]          [facility]    Date of Admission:  05/19/2017   Total days of antibiotics 17        Day to ceftazidime  He has grown Klebsiella from his neck abscesses and recent BAL culture. Susceptibility results are pending on the sputum isolate. I will continue ceftazidime for now.         Michel Bickers, MD [facility] [phone] pager   [phone] cell 06/20/2017, 10:38 AM

## 2017-06-21 DIAGNOSIS — B961 Klebsiella pneumoniae [K. pneumoniae] as the cause of diseases classified elsewhere: Secondary | ICD-10-CM

## 2017-06-21 DIAGNOSIS — M726 Necrotizing fasciitis: Secondary | ICD-10-CM

## 2017-06-21 DIAGNOSIS — J9601 Acute respiratory failure with hypoxia: Secondary | ICD-10-CM

## 2017-06-21 LAB — HCV RNA QUANT RFLX ULTRA OR GENOTYP
HCV RNA Qnt(log copy/mL): 5.584 {Log_IU}/mL
HepC Qn: 384000 [IU]/mL

## 2017-06-21 LAB — GLUCOSE, CAPILLARY
GLUCOSE-CAPILLARY: 184 mg/dL — AB (ref 65–99)
GLUCOSE-CAPILLARY: 190 mg/dL — AB (ref 65–99)
GLUCOSE-CAPILLARY: 195 mg/dL — AB (ref 65–99)
GLUCOSE-CAPILLARY: 208 mg/dL — AB (ref 65–99)
GLUCOSE-CAPILLARY: 251 mg/dL — AB (ref 65–99)
Glucose-Capillary: 244 mg/dL — ABNORMAL HIGH (ref 65–99)

## 2017-06-21 LAB — CBC WITH DIFFERENTIAL/PLATELET
BASOS PCT: 0 %
Basophils Absolute: 0 10*3/uL (ref 0.0–0.1)
EOS PCT: 0 %
Eosinophils Absolute: 0 10*3/uL (ref 0.0–0.7)
HEMATOCRIT: 22 % — AB (ref 39.0–52.0)
HEMOGLOBIN: 7 g/dL — AB (ref 13.0–17.0)
Lymphocytes Relative: 7 %
Lymphs Abs: 0.6 10*3/uL — ABNORMAL LOW (ref 0.7–4.0)
MCH: 27.7 pg (ref 26.0–34.0)
MCHC: 31.8 g/dL (ref 30.0–36.0)
MCV: 87 fL (ref 78.0–100.0)
MONO ABS: 0.1 10*3/uL (ref 0.1–1.0)
Monocytes Relative: 1 %
NEUTROS PCT: 92 %
Neutro Abs: 7.9 10*3/uL — ABNORMAL HIGH (ref 1.7–7.7)
Platelets: 68 10*3/uL — ABNORMAL LOW (ref 150–400)
RBC: 2.53 MIL/uL — ABNORMAL LOW (ref 4.22–5.81)
RDW: 17.3 % — AB (ref 11.5–15.5)
WBC: 8.6 10*3/uL (ref 4.0–10.5)

## 2017-06-21 LAB — PREPARE RBC (CROSSMATCH)

## 2017-06-21 LAB — RENAL FUNCTION PANEL
Albumin: <1 g/dL — ABNORMAL LOW (ref 3.5–5.0)
Anion gap: 10 (ref 5–15)
BUN: 103 mg/dL — AB (ref 6–20)
CHLORIDE: 106 mmol/L (ref 101–111)
CO2: 27 mmol/L (ref 22–32)
CREATININE: 2.79 mg/dL — AB (ref 0.61–1.24)
Calcium: 7.3 mg/dL — ABNORMAL LOW (ref 8.9–10.3)
GFR calc Af Amer: 27 mL/min — ABNORMAL LOW (ref >60)
GFR calc non Af Amer: 23 mL/min — ABNORMAL LOW (ref >60)
GLUCOSE: 230 mg/dL — AB (ref 65–99)
POTASSIUM: 3.9 mmol/L (ref 3.5–5.1)
Phosphorus: 7 mg/dL — ABNORMAL HIGH (ref 2.5–4.6)
Sodium: 143 mmol/L (ref 135–145)

## 2017-06-21 LAB — CBC
HEMATOCRIT: 20.5 % — AB (ref 39.0–52.0)
HEMOGLOBIN: 6.6 g/dL — AB (ref 13.0–17.0)
MCH: 27.5 pg (ref 26.0–34.0)
MCHC: 32.2 g/dL (ref 30.0–36.0)
MCV: 85.4 fL (ref 78.0–100.0)
Platelets: 66 10*3/uL — ABNORMAL LOW (ref 150–400)
RBC: 2.4 MIL/uL — AB (ref 4.22–5.81)
RDW: 17.2 % — ABNORMAL HIGH (ref 11.5–15.5)
WBC: 7.9 10*3/uL (ref 4.0–10.5)

## 2017-06-21 LAB — HEPATITIS C GENOTYPE: Hepatitis C Genotype: 6

## 2017-06-21 LAB — PATHOLOGIST SMEAR REVIEW

## 2017-06-21 LAB — MAGNESIUM: Magnesium: 2.5 mg/dL — ABNORMAL HIGH (ref 1.7–2.4)

## 2017-06-21 MED ORDER — FUROSEMIDE 10 MG/ML IJ SOLN
40.0000 mg | Freq: Two times a day (BID) | INTRAMUSCULAR | Status: AC
  Administered 2017-06-21 – 2017-06-22 (×2): 40 mg via INTRAVENOUS
  Filled 2017-06-21 (×2): qty 4

## 2017-06-21 MED ORDER — CEFAZOLIN SODIUM-DEXTROSE 2-4 GM/100ML-% IV SOLN
2.0000 g | Freq: Two times a day (BID) | INTRAVENOUS | Status: DC
  Administered 2017-06-21 – 2017-06-23 (×4): 2 g via INTRAVENOUS
  Filled 2017-06-21 (×4): qty 100

## 2017-06-21 MED ORDER — MIDAZOLAM BOLUS VIA INFUSION
1.0000 mg | INTRAVENOUS | Status: DC | PRN
  Filled 2017-06-21: qty 2

## 2017-06-21 MED ORDER — SODIUM CHLORIDE 0.9 % IV SOLN
0.0000 mg/h | INTRAVENOUS | Status: DC
  Administered 2017-06-21: 2 mg/h via INTRAVENOUS
  Administered 2017-06-21: 4 mg/h via INTRAVENOUS
  Administered 2017-06-22: 1 mg/h via INTRAVENOUS
  Filled 2017-06-21 (×3): qty 10

## 2017-06-21 MED ORDER — INSULIN ASPART 100 UNIT/ML ~~LOC~~ SOLN
2.0000 [IU] | SUBCUTANEOUS | Status: DC
  Administered 2017-06-21 (×2): 6 [IU] via SUBCUTANEOUS
  Administered 2017-06-21: 4 [IU] via SUBCUTANEOUS
  Administered 2017-06-22: 2 [IU] via SUBCUTANEOUS
  Administered 2017-06-22 (×5): 4 [IU] via SUBCUTANEOUS
  Administered 2017-06-23: 6 [IU] via SUBCUTANEOUS
  Administered 2017-06-23: 2 [IU] via SUBCUTANEOUS

## 2017-06-21 MED ORDER — SODIUM CHLORIDE 0.9 % IV SOLN
Freq: Once | INTRAVENOUS | Status: AC
  Administered 2017-06-21: 02:00:00 via INTRAVENOUS

## 2017-06-21 MED ORDER — SODIUM CHLORIDE 0.9 % IV BOLUS (SEPSIS): 1000.0000 mL | Freq: Once | INTRAVENOUS | Status: DC

## 2017-06-21 MED ORDER — ALBUMIN HUMAN 5 % IV SOLN
12.5000 g | Freq: Once | INTRAVENOUS | Status: AC
  Administered 2017-06-21: 12.5 g via INTRAVENOUS
  Filled 2017-06-21: qty 250

## 2017-06-21 MED ORDER — DEXTROSE 5 % IV SOLN: 1.0000 g | INTRAVENOUS | Status: DC

## 2017-06-21 NOTE — Progress Notes (Addendum)
Patient ID: [name] [name], male   DOB: 01-18-1956, 61 y.o.   MRN: [id number]          [facility]    Date of Admission:  03/17/2017   Total days of antibiotics 18        Day 3 ceftazidime  He has severe head and neck soft tissue infection with abscesses caused by Klebsiella. He also has severe right lower lobe pneumonia caused by the same Klebsiella that persists despite 2 and half weeks of antibiotic therapy that should be effective. Fortunately his wounds appear to be getting better. His Klebsiella isolate is quite sensitive. I will narrow down to renally adjusted cefazolin.         Cliffton AstersJohn Sherman Donaldson, MD [facility] [phone] pager   [phone] cell 06/21/2017, 4:11 PM

## 2017-06-21 NOTE — Progress Notes (Signed)
Pharmacy Antibiotic Note  [name] [name] is a 61 y.o. male admitted on 05/29/2017 with Klebsiella necrotizing head and neck abscess, severe RLL Klebsiella PNA.  Pharmacy has been consulted to de-escalate from Ceftazidime to cefazolin dosing. SCr trending up to 2.79. CrCl ~ 25. Wound improving per ENT/ID notes. Afebrile, WBC trend down to wnl.  Plan: D/c ceftazidime Start cefazolin 2g IV q12h Monitor clinical progress, c/s, renal function, LOT   Height: 5' (152.4 cm) Weight: 172 lb 13.5 oz (78.4 kg) IBW/kg (Calculated) : 50  Temp (24hrs), Avg:99.2 F (37.3 C), Min:98.3 F (36.8 C), Max:100.2 F (37.9 C)   Recent Labs Lab 07/01/2017 0424 06/30/2017 1715 06/18/17 0400 06/19/17 0345 06/20/17 0419 06/21/17 0540  WBC 32.6* 31.6* 11.9* 18.0* 12.8* 8.6  CREATININE 0.96  --  1.30* 1.57* 1.83* 2.79*    Estimated Creatinine Clearance: 24.8 mL/min (A) (by C-G formula based on SCr of 2.79 mg/dL (H)).    No Known Allergies   [name] [name], PharmD, BCPS Clinical Pharmacist 06/21/2017 4:28 PM

## 2017-06-21 NOTE — Progress Notes (Signed)
eLink Physician-Brief Progress Note Patient Name: [name] [name] DOB: 08-02-1956 MRN: [id number]   Date of Service  06/21/2017  HPI/Events of Note  Hgb = 6.6.  eICU Interventions  Will transfuse 1 unit PRBC now.      Intervention Category Major Interventions: Other:  Sommer,Steven Dennard Nipugene 06/21/2017, 7:03 PM

## 2017-06-21 NOTE — Progress Notes (Signed)
   Subjective:    Patient ID: [name] [name], male    DOB: 1956-11-13, 61 y.o.   MRN: [id number]  HPI Remains sedated on ventilator.  Review of Systems     Objective:   Physical Exam Tm 100.2 VSS Sedated. Trach site stable. Changed dressings and debrided necrotic tissue from right neck, submental neck, right premaxilla and buccal space, upper and lower eyelids, right temporal space, and left neck.  Some purulent fluid in right temporal space.    Assessment & Plan:  Right neck, right parotid, right upper and lower eyelid, right temporal, and left neck abscess/necrotizing fasciitis.  Changed dressings and debrided surface necrotic tissue from wounds.  Will continue dressing changes and add a nursing change each day.  Fever curve and WBC improved.

## 2017-06-21 NOTE — Progress Notes (Signed)
PULMONARY / CRITICAL CARE MEDICINE   Name: [name] [name] MRN: [id number] DOB: 1956/08/21    ADMISSION DATE:  06/02/2017 CONSULTATION DATE:  05/30/2017  REFERRING MD:  Dr. Thereasa Solo  CHIEF COMPLAINT:  Right parapharyngeal and neck abscesses  HISTORY OF PRESENT ILLNESS:    61 y.o. male with PMH as below, which is significant for DM, HTN, and Hepatitis C. He was in his usual state of health until Friday 6/15 when he noticed some minor R sided facial swelling. This continued to worsen in the following days and he developed associated fevers/chills, dyspnea, and dysphagia. He presented to urgent care 6/17 and was prescribed Clindamycin. He took his first dose, but was unable to take any subsequent doses due to dysphagia. He presented to [facility] ED 6/18 for this complaint. He had CT scan done which demonstrated right parotitis and facial cellulitis. Due to concern for airway compromise, PCCM asked to evaluate for admission. He was found to have parapharyngeal abscess and underwent I&D and trach 6/25.  SUBJECTIVE:  Some urinary retention overnight, improved when foley flushed. 1L output at that time.  VITAL SIGNS: BP 131/60   Pulse 91   Temp 98.3 F (36.8 C) (Oral)   Resp (!) 27   Ht 5' (1.524 m)   Wt 78.4 kg (172 lb 13.5 oz)   SpO2 95%   BMI 33.76 kg/m   HEMODYNAMICS: CVP:  [16 mmHg] 16 mmHg  VENTILATOR SETTINGS: Vent Mode: PRVC FiO2 (%):  [40 %-50 %] 40 % Set Rate:  [20 bmp] 20 bmp Vt Set:  [400 mL-440 mL] 400 mL PEEP:  [10 cmH20] 10 cmH20 Plateau Pressure:  [24 cmH20-38 cmH20] 38 cmH20  INTAKE / OUTPUT: I/O last 3 completed shifts: In: 3444.2 [I.V.:1824.2; NG/GT:1620] Out: 2595 [Urine:1870; Stool:725]  PHYSICAL EXAMINATION:  General: adult male, overweight, in NAD Neuro: Sedated RASS -3. Grimacing with dressing change HEENT: Several open surgical areas on both sides of face, neck. Packing in place.  PULM: Coarse bilaterally. Diminished R base CV: RRR, no MRG GI: Mild  distension, soft.  Extremities: BLE edema.   LABS:  BMET  Recent Labs Lab 06/19/17 0345 06/20/17 0419 06/21/17 0540  NA 140 141 143  K 4.0 3.3* 3.9  CL 104 104 106  CO2 _0 BUN 46* 66* 103*  CREATININE 1.57* 1.83* 2.79*  GLUCOSE 151* 218* 230*    Electrolytes  Recent Labs Lab 06/18/17 1230 06/19/17 0345 06/20/17 0419 06/21/17 0540  CALCIUM  --  6.8* 7.0* 7.3*  MG 1.9 2.1  --  2.5*  PHOS 6.9* 7.4*  --  7.0*    CBC  Recent Labs Lab 06/19/17 0345 06/20/17 0419 06/21/17 0540  WBC 18.0* 12.8* 8.6  HGB 7.8* 7.8* 7.0*  HCT 23.5* 23.8* 22.0*  PLT 89* 83* 68*    Coag's  Recent Labs Lab 06/15/17 1125 06/16/17 0407  APTT 53*  --   INR 1.40 1.30    Sepsis Markers No results for input(s): LATICACIDVEN, PROCALCITON, O2SATVEN in the last 168 hours.  ABG  Recent Labs Lab 07/12/2017 0841 06/18/17 0335 06/18/17 1208  PHART 7.425 7.334* 7.330*  PCO2ART 44.6 46.2 49.5*  PO2ART 384.0* 73.7* 71.0*    Liver Enzymes  Recent Labs Lab 06/24/2017 0424 06/18/17 0400 06/20/17 0419 06/21/17 0540  AST 53* 65* 124*  --   ALT 21 15* 22  --   ALKPHOS 108 107 219*  --   BILITOT 2.4* 1.5* 3.6*  --   ALBUMIN <1.0* <1.0* <1.0* <  1.0*    Cardiac Enzymes No results for input(s): TROPONINI, PROBNP in the last 168 hours.  Glucose  Recent Labs Lab 06/20/17 1113 06/20/17 1526 06/20/17 1930 06/20/17 2335 06/21/17 0345 06/21/17 0732  GLUCAP 197* 179* 239* 200* 251* 195*   Imaging Ct Soft Tissue Neck W Contrast  Result Date: 06/01/2017 CLINICAL DATA:  Followup parotiditis. EXAM: CT NECK WITH CONTRAST TECHNIQUE: Multidetector CT imaging of the neck was performed using the standard protocol following the bolus administration of intravenous contrast. CONTRAST:  75 cc Isovue-300 COMPARISON:  06/08/2017, 05/25/2017 FINDINGS: There is further worsening by imaging. Patient appears to have diffuse suppurative inflammation of the right parotid and submandibular  glands. Diffuse cellulitis/abscess formation throughout the right face and deep spaces with specific findings as below. Low-density fluid, presumably abscess, superficial to the right parotid gland with thickness measuring up to 16 mm. Low-density fluid, presumably abscess, right periorbital preseptal region measuring up to 2 cm in thickness. More superficial subcutaneous fluid in the right cheek measuring up to 17 mm in thickness. Right parapharyngeal space abscess much larger, measuring 3.5 x 2.7 cm and extending over a length of almost 6 cm, with mass effect upon the oropharynx and hypopharynx. Abscess within the right submandibular gland extending into the sublingual region. No sign of intracranial extension in the limited intracranial images. No sign of postseptal orbital inflammation on the limited images. Diffuse mucosal inflammation of the right maxillary sinus. No layering fluid. Some dental and periodontal disease, but without advanced periodontal disease/abscess of bone. IMPRESSION: Continued worsening of inflammatory disease of the right face and deep spaces. Most notable changes include enlargement of the right parapharyngeal abscess now measuring up to 3.5 cm in diameter and extending over a length of almost 6 cm with worsened mass effect upon the hypopharynx and pharynx. See above for description of other locations of involvement/extension. Electronically Signed   By: Nelson Chimes M.D.   On: 05/28/2017 09:17    STUDIES:  CT Neck 6/18 > Diffuse facial swelling favored to represent bacterial RIGHT parotitis. Significant facial cellulitis, with RIGHT muscle of mastication enlargement. No visible calculi. No drainable abscess. Minor dental caries and periapical lucencies, not felt to be the source of inflammation. CXR 6/22 > Low lung volumes with right basilar atelectasis. Upper normal heart size likely exaggerated by technique and low lung volumes. Left lung base and costophrenic angle not  included in the field of view. Frontal and lateral views may be helpful if patient is able. CT neck 6/22 > Extensive severe multi compartmental infectious/inflammatory process involving the right face from the right periorbital soft tissues to the upper neck. Inflammation involves the all right-sided deep cervical compartments including right parapharyngeal and retropharyngeal spaces. There multiple ill-defined fluid collections probably representing phlegmon. No discrete rim enhancing abscess is Identified. There is rightward displacement of the airway and moderate narrowing of the airway due to fluid collections in parapharyngeal space and swelling of oral and hypopharyngeal mucosa. The origin of infection is uncertain, possibly odontogenic given the presence of odontogenic disease. CT Neck 6/25 > Continued worsening of inflammatory disease of the right face and deep spaces. Most notable changes include enlargement of the right parapharyngeal abscess now measuring up to 3.5 cm in diameter and extending over a length of almost 6 cm with worsened mass effect upon the hypopharynx and pharynx.  6/27 CxR with ft needing advancement.  CULTURES: Blood 6/18 > No growth  MRSA PCR 6/18 > Negative  MRSA PCP 6/25 > Negative Staphy  aureus 6/25 > Negative  Aerobic/Anareboic Culture 6/25 >> Kleb P. Res to ampicillin  6.28 BC>>>NGTD 7/1 OR>>>klieb>>> sens pending Bronch bal 7/2>>>stain gram neg rod>>>  ANTIBIOTICS: Per ID Clindamycin 6/18 > 6/23>>> Unasyn 6/18 > 6/22 Vancomycin 6/23 >>>6/28 unasyn 6/27>>>7/1 Zosyn 6/23 >>>6/27 merom 7/1>7/3 Ceftazidine 7/3 >>>  IVIG 7/1>>> x 1, further held crt rise  SIGNIFICANT EVENTS: 6/18 > Presents to ED 6/25 > Taken to OR for I&D and Trach  6/29- fevers, vent 6/30- bleeding, blood, cauterized bedside 7/1 fib rvr, borderline bP, to OR  LINES/TUBES: Trach 6/25 >>  L femoral CVC 6/25 >>> Left cvc 7/1>>> picc 6/27>>> Aline ( in OR 7/1)  >>>  DISCUSSION: 61 year old male presents to ED on 6/18 with right facial cellulitis/parotitis and parapharyngeal abscess s/p I&D and trach on 6/25. 6/28 lighten sedation and wean from vent as tolerated. Lantus stopped and D5W started as FT malpositioned and tube feeds stopped coupled with stopping steroids has led to hypoglycemia.  ASSESSMENT / PLAN:  PULMONARY A: Respiratory Insufficieny in setting of facial cellulitis/focal abscess s/p trach  Hypoxia 7/2- collapse rt main lung, s/p bronch 7/2. HCAP P:   Continue to follow CXR Keep PEEP 10 as sats 93% Chest PT Keep same MV VAP bundle  CARDIOVASCULAR A:  AF RVR, now sinus HTN P:  Telemetry monitoring  A:   AKI > SCr up to 2.8. Foley issues overnight, possibly post obstructive in origin. Clinically volume overloaded. ATN  P:   KVO Albumin now Lasix 45m BID x 2  GASTROINTESTINAL A:   Hep C genotype 1 with splenomegaly - ? Untreated Protein energy malnutition P:   PPI Tube feeds at goal  HEMATOLOGIC A:   Thrombocytopenia > sepsis Anemia with active wound bleeding x 2 days, resolved Thrombocytopenia > likely consumption with ongoing facial bleeding P:  Follow CBC Will hold transfusion for now, repeat CBC in PM and AM If plt drops further will need to stop heparin and assess for HIT  INFECTIOUS A:   Right Acute Parotitis with extensive facial cellulitis and focal right parapharyngeal abscess s/p I&D with Klebsiella P. Res to ampicillin - necrotizing Klebsiella PNA P:   Continue ceftazedine. Appreciate ID recommendations Hold further IVIG with crt rise Repeat OR trips per ENT to remove dead tissue per ENT  BID dressing changes  ENDOCRINE A:   DM with current hyperglycemia  P:   CBG monitoring and SSI  NEUROLOGIC A:   Acute Encephalopathy secondary to sedation  Pain severe, requiring limited WUA P:   RASS goal: -3 Versed/Fentanyl infusions Need WUA short then re-sedate  FAMILY  -  Inter-disciplinary family meet or Palliative Care meeting due by:  06/18/17 - done DF , ENT  Update: Wife updated 7/5  PGeorgann Housekeeper AGACNP-BC [facility] Pager [phone] 06/21/2017 11:56 AM

## 2017-06-21 NOTE — Progress Notes (Signed)
Inpatient Diabetes Program Recommendations  AACE/ADA: New Consensus Statement on Inpatient Glycemic Control (2015)  Target Ranges:  Prepandial:   less than 140 mg/dL      Peak postprandial:   less than 180 mg/dL (1-2 hours)      Critically ill patients:  140 - 180 mg/dL   Lab Results  Component Value Date   GLUCAP 195 (H) 06/21/2017   HGBA1C 10.6 (H) 06/08/2017    Review of Glycemic ControlResults for Schollmeyer, Henderson (MRN [id number]) as of 06/21/2017 11:08  Ref. Range 06/20/2017 15:26 06/20/2017 19:30 06/20/2017 23:35 06/21/2017 03:45 06/21/2017 07:32  Glucose-Capillary Latest Ref Range: 65 - 99 mg/dL 295179 (H) 284239 (H) 132200 (H) 251 (H) 195 (H)   Diabetes history: Type 2 diabetes Outpatient Diabetes medications: Metformin 500 mg bid, Glucotrol 5 mg daily Current orders for Inpatient glycemic control:  Novolog 2-4-6 q 4 hours Inpatient Diabetes Program Recommendations:  Consider adding Novolog tube feed coverage 3 units q 4 hours.  Thanks, Beryl MeagerJenny Eliya Geiman, RN, BC-ADM Inpatient Diabetes Coordinator Pager [phone] (8a-5p)

## 2017-06-21 NOTE — Progress Notes (Signed)
eLink Physician-Brief Progress Note Patient Name: [name] [name] DOB: [birth year] MRN: [id number]   Date of Service  06/21/2017  HPI/Events of Note  Elevated triglycerides while on propofol  eICU Interventions  Will change to versed infusion.      Intervention Category Major Interventions: Change in mental status - evaluation and management  Shane Crutchradeep Dani Danis 06/21/2017, 12:37 AM

## 2017-06-22 ENCOUNTER — Inpatient Hospital Stay (HOSPITAL_COMMUNITY): Payer: 59

## 2017-06-22 DIAGNOSIS — J189 Pneumonia, unspecified organism: Secondary | ICD-10-CM

## 2017-06-22 DIAGNOSIS — J9601 Acute respiratory failure with hypoxia: Secondary | ICD-10-CM

## 2017-06-22 DIAGNOSIS — N179 Acute kidney failure, unspecified: Secondary | ICD-10-CM

## 2017-06-22 LAB — GLUCOSE, CAPILLARY
GLUCOSE-CAPILLARY: 142 mg/dL — AB (ref 65–99)
GLUCOSE-CAPILLARY: 185 mg/dL — AB (ref 65–99)
Glucose-Capillary: 137 mg/dL — ABNORMAL HIGH (ref 65–99)
Glucose-Capillary: 167 mg/dL — ABNORMAL HIGH (ref 65–99)
Glucose-Capillary: 152 mg/dL — ABNORMAL HIGH (ref 65–99)
Glucose-Capillary: 156 mg/dL — ABNORMAL HIGH (ref 65–99)

## 2017-06-22 LAB — COMPREHENSIVE METABOLIC PANEL
ALK PHOS: 302 U/L — AB (ref 38–126)
ALT: 22 U/L (ref 17–63)
AST: 120 U/L — ABNORMAL HIGH (ref 15–41)
Albumin: <1 g/dL — ABNORMAL LOW (ref 3.5–5.0)
Anion gap: 12 (ref 5–15)
BILIRUBIN TOTAL: 8 mg/dL — AB (ref 0.3–1.2)
BUN: 120 mg/dL — ABNORMAL HIGH (ref 6–20)
CALCIUM: 7.6 mg/dL — AB (ref 8.9–10.3)
CO2: 26 mmol/L (ref 22–32)
CREATININE: 3.25 mg/dL — AB (ref 0.61–1.24)
Chloride: 107 mmol/L (ref 101–111)
GFR, EST AFRICAN AMERICAN: 22 mL/min — AB (ref >60)
GFR, EST NON AFRICAN AMERICAN: 19 mL/min — AB (ref >60)
Glucose, Bld: 135 mg/dL — ABNORMAL HIGH (ref 65–99)
Potassium: 3.3 mmol/L — ABNORMAL LOW (ref 3.5–5.1)
Sodium: 145 mmol/L (ref 135–145)
Total Protein: 6.5 g/dL (ref 6.5–8.1)

## 2017-06-22 LAB — CBC
HEMATOCRIT: 25.6 % — AB (ref 39.0–52.0)
HEMOGLOBIN: 8.1 g/dL — AB (ref 13.0–17.0)
MCH: 27 pg (ref 26.0–34.0)
MCHC: 31.6 g/dL (ref 30.0–36.0)
MCV: 85.3 fL (ref 78.0–100.0)
Platelets: 76 10*3/uL — ABNORMAL LOW (ref 150–400)
RBC: 3 MIL/uL — AB (ref 4.22–5.81)
RDW: 16.7 % — ABNORMAL HIGH (ref 11.5–15.5)
WBC: 7.7 10*3/uL (ref 4.0–10.5)

## 2017-06-22 MED ORDER — AMIODARONE HCL 200 MG PO TABS
400.0000 mg | ORAL_TABLET | Freq: Two times a day (BID) | ORAL | Status: DC
  Administered 2017-06-22 – 2017-06-23 (×4): 400 mg
  Filled 2017-06-22 (×5): qty 2

## 2017-06-22 MED ORDER — NYSTATIN 100000 UNIT/GM EX POWD
Freq: Two times a day (BID) | CUTANEOUS | Status: DC
  Administered 2017-06-22: 1 g via TOPICAL
  Administered 2017-06-22 – 2017-06-25 (×6): via TOPICAL
  Filled 2017-06-22: qty 15

## 2017-06-22 MED ORDER — POTASSIUM CHLORIDE 20 MEQ/15ML (10%) PO SOLN
30.0000 meq | Freq: Once | ORAL | Status: AC
  Administered 2017-06-22: 30 meq
  Filled 2017-06-22: qty 30

## 2017-06-22 NOTE — Consult Note (Signed)
Reason for Consult:Renal failure Referring Physician:  Dr. Vaughan Browner  Chief Complaint: Right facial swelling  Assessment/Plan: 1. Acute Kidney injury secondary to contrast (administered 6/18, 6/22, 6/25 and 6/28) + IVIG (7/1) and currently with contrast + osmotic tubular damage leading to ATN. THrough the translator I explained to the spouse that currently he doesn't have any absolute indications for dialysis but if his renal function doesn't improve in the next few days we have to initiate dialysis especially if his UOP starts to drop off. He may be in for a more prolonged course given the history and I would not be surprised if we have to initiate RRT in the next 24-48 hrs.  - Avoid nephrotoxins as you are already doing. - Will follow closely and determine when RRT will be necessary; fortunately no absolute indications at this time. 2. Parotitis + abscesses w/ Klebsiella. 3. Afib RVR but now sinus 4. HCV - has received tx in the past per spouse. 5. Anemia - tx 7/5 6. DM    HPI: [name] [name] is an 61 y.o. male from [place] with a h/o DM, HTN, HCV admitted with right sided facial swelling, fever, chills w/ a temp of 103 + leukocytosis w/ CT showing rt parotitis and cellultis on 6/18. Tx with Clinda and Unasyn and later Vancomycin and repeat CT showing a parapharyngeal abscess. I&D on 6/25 and continued on Vancomycin and Zosyn. He received contrast on 6/18, 6/22, 6/25 and then 6/28 with worsening renal function since 6/30 7/1.  He was also given Privigen on 7/1. His renal function has steadily worsened but the UOP has been adequate. Through a translator the spouse gave additional history - occasional NSAID use but not regular, no h/o UTI's/hematuria/foamy urine/nephrolithiasis/prior h/o kidney problems or rashes.  ROS Pertinent items are noted in HPI.  Chemistry and CBC: Creat  Date/Time Value Ref Range Status  04/05/2015 07:14 PM 0.83 0.50 - 1.35 mg/dL Final  07/28/2014 08:55 PM 0.76 0.50 - 1.35  mg/dL Final  02/23/2014 07:41 PM 0.80 0.50 - 1.35 mg/dL Final   Creatinine, Ser  Date/Time Value Ref Range Status  06/22/2017 05:48 AM 3.25 (H) 0.61 - 1.24 mg/dL Final  06/21/2017 05:40 AM 2.79 (H) 0.61 - 1.24 mg/dL Final  06/20/2017 04:19 AM 1.83 (H) 0.61 - 1.24 mg/dL Final  06/19/2017 03:45 AM 1.57 (H) 0.61 - 1.24 mg/dL Final  06/18/2017 04:00 AM 1.30 (H) 0.61 - 1.24 mg/dL Final  06/22/2017 04:24 AM 0.96 0.61 - 1.24 mg/dL Final  06/15/2017 03:30 AM 0.72 0.61 - 1.24 mg/dL Final  06/12/2017 09:14 AM 0.71 0.61 - 1.24 mg/dL Final  06/13/2017 04:15 AM 0.92 0.61 - 1.24 mg/dL Final  06/12/2017 04:58 AM 0.95 0.61 - 1.24 mg/dL Final  06/02/2017 01:48 AM 0.75 0.61 - 1.24 mg/dL Final  06/10/2017 03:18 AM 0.73 0.61 - 1.24 mg/dL Final  06/09/2017 04:05 AM 0.86 0.61 - 1.24 mg/dL Final  06/08/2017 07:12 AM 0.93 0.61 - 1.24 mg/dL Final  06/07/2017 04:25 AM 0.92 0.61 - 1.24 mg/dL Final  06/06/2017 03:22 AM 0.90 0.61 - 1.24 mg/dL Final  06/05/2017 09:25 AM 0.96 0.61 - 1.24 mg/dL Final  06/13/2017 07:40 PM 1.07 0.61 - 1.24 mg/dL Final  05/27/2017 09:57 AM 0.70 0.61 - 1.24 mg/dL Final  09/14/2015 12:36 PM 0.72 0.61 - 1.24 mg/dL Final  09/11/2015 05:34 AM 0.82 0.61 - 1.24 mg/dL Final  09/10/2015 12:05 AM 0.93 0.61 - 1.24 mg/dL Final    Recent Labs Lab 07/16/2017 0424  06/30/2017 0850 07/04/2017 1010 06/18/17  0400 06/18/17 1230 06/19/17 0345 06/20/17 0419 06/21/17 0540 06/22/17 0548  NA 142  < > 146* 147* 136  --  140 141 143 145  K 3.9  < > 3.8 4.3 4.1 4.0 4.0 3.3* 3.9 3.3*  CL 109  --   --   --  107  --  104 104 106 107  CO2 26  --   --   --  25  --  25 26 27 26   GLUCOSE 199*  --  138* 137* 115*  --  151* 218* 230* 135*  BUN 19  --   --   --  30*  --  46* 66* 103* 120*  CREATININE 0.96  --   --   --  1.30*  --  1.57* 1.83* 2.79* 3.25*  CALCIUM 7.1*  --   --   --  6.5*  --  6.8* 7.0* 7.3* 7.6*  PHOS  --   --   --   --   --  6.9* 7.4*  --  7.0*  --   < > = values in this interval not  displayed.  Recent Labs Lab 06/21/2017 0424  06/18/17 0400  06/20/17 0419 06/21/17 0540 06/21/17 1749 06/22/17 0548  WBC 32.6*  < > 11.9*  < > 12.8* 8.6 7.9 7.7  NEUTROABS 31.6*  --  10.7*  --  11.6* 7.9*  --   --   HGB 8.6*  < > 10.2*  < > 7.8* 7.0* 6.6* 8.1*  HCT 26.5*  < > 31.0*  < > 23.8* 22.0* 20.5* 25.6*  MCV 80.3  < > 83.3  < > 85.3 87.0 85.4 85.3  PLT 163  < > 77*  < > 83* 68* 66* 76*  < > = values in this interval not displayed. Liver Function Tests:  Recent Labs Lab 06/18/17 0400 06/20/17 0419 06/21/17 0540 06/22/17 0548  AST 65* 124*  --  120*  ALT 15* 22  --  22  ALKPHOS 107 219*  --  302*  BILITOT 1.5* 3.6*  --  8.0*  PROT 6.8 6.4*  --  6.5  ALBUMIN <1.0* <1.0* <1.0* <1.0*   No results for input(s): LIPASE, AMYLASE in the last 168 hours. No results for input(s): AMMONIA in the last 168 hours. Cardiac Enzymes: No results for input(s): CKTOTAL, CKMB, CKMBINDEX, TROPONINI in the last 168 hours. Iron Studies: No results for input(s): IRON, TIBC, TRANSFERRIN, FERRITIN in the last 72 hours. PT/INR: @LABRCNTIP (inr:5)  Xrays/Other Studies: ) Results for orders placed or performed during the hospital encounter of 05/28/2017 (from the past 48 hour(s))  Glucose, capillary     Status: Abnormal   Collection Time: 06/20/17  3:26 PM  Result Value Ref Range   Glucose-Capillary 179 (H) 65 - 99 mg/dL   Comment 1 Capillary Specimen    Comment 2 Notify RN   Glucose, capillary     Status: Abnormal   Collection Time: 06/20/17  7:30 PM  Result Value Ref Range   Glucose-Capillary 239 (H) 65 - 99 mg/dL   Comment 1 Document in Chart   Triglycerides     Status: Abnormal   Collection Time: 06/20/17  8:43 PM  Result Value Ref Range   Triglycerides 612 (H) <150 mg/dL  Glucose, capillary     Status: Abnormal   Collection Time: 06/20/17 11:35 PM  Result Value Ref Range   Glucose-Capillary 200 (H) 65 - 99 mg/dL   Comment 1 Notify RN   Glucose, capillary  Status: Abnormal    Collection Time: 06/21/17  3:45 AM  Result Value Ref Range   Glucose-Capillary 251 (H) 65 - 99 mg/dL   Comment 1 Notify RN   Magnesium     Status: Abnormal   Collection Time: 06/21/17  5:40 AM  Result Value Ref Range   Magnesium 2.5 (H) 1.7 - 2.4 mg/dL  CBC with Differential/Platelet     Status: Abnormal   Collection Time: 06/21/17  5:40 AM  Result Value Ref Range   WBC 8.6 4.0 - 10.5 K/uL   RBC 2.53 (L) 4.22 - 5.81 MIL/uL   Hemoglobin 7.0 (L) 13.0 - 17.0 g/dL    Comment: REPEATED TO VERIFY   HCT 22.0 (L) 39.0 - 52.0 %   MCV 87.0 78.0 - 100.0 fL   MCH 27.7 26.0 - 34.0 pg   MCHC 31.8 30.0 - 36.0 g/dL   RDW 17.3 (H) 11.5 - 15.5 %   Platelets 68 (L) 150 - 400 K/uL    Comment: CONSISTENT WITH PREVIOUS RESULT   Neutrophils Relative % 92 %   Lymphocytes Relative 7 %   Monocytes Relative 1 %   Eosinophils Relative 0 %   Basophils Relative 0 %   Neutro Abs 7.9 (H) 1.7 - 7.7 K/uL   Lymphs Abs 0.6 (L) 0.7 - 4.0 K/uL   Monocytes Absolute 0.1 0.1 - 1.0 K/uL   Eosinophils Absolute 0.0 0.0 - 0.7 K/uL   Basophils Absolute 0.0 0.0 - 0.1 K/uL   RBC Morphology POLYCHROMASIA PRESENT     Comment: RARE NRBCs   WBC Morphology TOXIC GRANULATION     Comment: INCREASED BANDS (>20% BANDS)  Renal function panel     Status: Abnormal   Collection Time: 06/21/17  5:40 AM  Result Value Ref Range   Sodium 143 135 - 145 mmol/L   Potassium 3.9 3.5 - 5.1 mmol/L   Chloride 106 101 - 111 mmol/L   CO2 27 22 - 32 mmol/L   Glucose, Bld 230 (H) 65 - 99 mg/dL   BUN 103 (H) 6 - 20 mg/dL   Creatinine, Ser 2.79 (H) 0.61 - 1.24 mg/dL   Calcium 7.3 (L) 8.9 - 10.3 mg/dL   Phosphorus 7.0 (H) 2.5 - 4.6 mg/dL   Albumin <1.0 (L) 3.5 - 5.0 g/dL   GFR calc non Af Amer 23 (L) >60 mL/min   GFR calc Af Amer 27 (L) >60 mL/min    Comment: (NOTE) The eGFR has been calculated using the CKD EPI equation. This calculation has not been validated in all clinical situations. eGFR's persistently <60 mL/min signify possible  Chronic Kidney Disease.    Anion gap 10 5 - 15  Glucose, capillary     Status: Abnormal   Collection Time: 06/21/17  7:32 AM  Result Value Ref Range   Glucose-Capillary 195 (H) 65 - 99 mg/dL   Comment 1 Capillary Specimen    Comment 2 Notify RN   Glucose, capillary     Status: Abnormal   Collection Time: 06/21/17 11:41 AM  Result Value Ref Range   Glucose-Capillary 184 (H) 65 - 99 mg/dL   Comment 1 Capillary Specimen    Comment 2 Notify RN   Glucose, capillary     Status: Abnormal   Collection Time: 06/21/17  3:50 PM  Result Value Ref Range   Glucose-Capillary 208 (H) 65 - 99 mg/dL   Comment 1 Capillary Specimen    Comment 2 Notify RN   CBC     Status:  Abnormal   Collection Time: 06/21/17  5:49 PM  Result Value Ref Range   WBC 7.9 4.0 - 10.5 K/uL   RBC 2.40 (L) 4.22 - 5.81 MIL/uL   Hemoglobin 6.6 (LL) 13.0 - 17.0 g/dL    Comment: REPEATED TO VERIFY CRITICAL RESULT CALLED TO, READ BACK BY AND VERIFIED WITH: RN J BEABRAUT AT [id number] MARTINB    HCT 20.5 (L) 39.0 - 52.0 %   MCV 85.4 78.0 - 100.0 fL   MCH 27.5 26.0 - 34.0 pg   MCHC 32.2 30.0 - 36.0 g/dL   RDW 17.2 (H) 11.5 - 15.5 %   Platelets 66 (L) 150 - 400 K/uL    Comment: CONSISTENT WITH PREVIOUS RESULT  Glucose, capillary     Status: Abnormal   Collection Time: 06/21/17  7:36 PM  Result Value Ref Range   Glucose-Capillary 244 (H) 65 - 99 mg/dL   Comment 1 Notify RN   Type and screen [place]     Status: None (Preliminary result)   Collection Time: 06/21/17  8:07 PM  Result Value Ref Range   ABO/RH(D) B POS    Antibody Screen NEG    Sample Expiration 06/24/2017    Unit Number [id number]    Blood Component Type RED CELLS,LR    Unit division 00    Status of Unit ISSUED    Transfusion Status OK TO TRANSFUSE    Crossmatch Result Compatible   Prepare RBC     Status: None   Collection Time: 06/21/17  8:07 PM  Result Value Ref Range   Order Confirmation ORDER PROCESSED BY BLOOD BANK    Glucose, capillary     Status: Abnormal   Collection Time: 06/21/17 11:34 PM  Result Value Ref Range   Glucose-Capillary 190 (H) 65 - 99 mg/dL   Comment 1 Capillary Specimen   Glucose, capillary     Status: Abnormal   Collection Time: 06/22/17  3:32 AM  Result Value Ref Range   Glucose-Capillary 137 (H) 65 - 99 mg/dL   Comment 1 Capillary Specimen   CBC     Status: Abnormal   Collection Time: 06/22/17  5:48 AM  Result Value Ref Range   WBC 7.7 4.0 - 10.5 K/uL   RBC 3.00 (L) 4.22 - 5.81 MIL/uL   Hemoglobin 8.1 (L) 13.0 - 17.0 g/dL    Comment: POST TRANSFUSION SPECIMEN   HCT 25.6 (L) 39.0 - 52.0 %   MCV 85.3 78.0 - 100.0 fL    Comment: POST TRANSFUSION SPECIMEN   MCH 27.0 26.0 - 34.0 pg   MCHC 31.6 30.0 - 36.0 g/dL   RDW 16.7 (H) 11.5 - 15.5 %    Comment: POST TRANSFUSION SPECIMEN   Platelets 76 (L) 150 - 400 K/uL    Comment: CONSISTENT WITH PREVIOUS RESULT  Comprehensive metabolic panel     Status: Abnormal   Collection Time: 06/22/17  5:48 AM  Result Value Ref Range   Sodium 145 135 - 145 mmol/L   Potassium 3.3 (L) 3.5 - 5.1 mmol/L   Chloride 107 101 - 111 mmol/L   CO2 26 22 - 32 mmol/L   Glucose, Bld 135 (H) 65 - 99 mg/dL   BUN 120 (H) 6 - 20 mg/dL   Creatinine, Ser 3.25 (H) 0.61 - 1.24 mg/dL   Calcium 7.6 (L) 8.9 - 10.3 mg/dL   Total Protein 6.5 6.5 - 8.1 g/dL   Albumin <1.0 (L) 3.5 - 5.0 g/dL   AST  120 (H) 15 - 41 U/L   ALT 22 17 - 63 U/L   Alkaline Phosphatase 302 (H) 38 - 126 U/L   Total Bilirubin 8.0 (H) 0.3 - 1.2 mg/dL   GFR calc non Af Amer 19 (L) >60 mL/min   GFR calc Af Amer 22 (L) >60 mL/min    Comment: (NOTE) The eGFR has been calculated using the CKD EPI equation. This calculation has not been validated in all clinical situations. eGFR's persistently <60 mL/min signify possible Chronic Kidney Disease.    Anion gap 12 5 - 15  Glucose, capillary     Status: Abnormal   Collection Time: 06/22/17  7:48 AM  Result Value Ref Range   Glucose-Capillary 167  (H) 65 - 99 mg/dL   Comment 1 Capillary Specimen    Comment 2 Notify RN   Glucose, capillary     Status: Abnormal   Collection Time: 06/22/17 12:11 PM  Result Value Ref Range   Glucose-Capillary 185 (H) 65 - 99 mg/dL   Comment 1 Document in Chart    Dg Chest Port 1 View  Result Date: 06/22/2017 CLINICAL DATA:  Pneumonia. EXAM: PORTABLE CHEST 1 VIEW COMPARISON:  06/20/2017. FINDINGS: Tracheostomy tube, feeding tube, right PICC line stable position. Heart size stable. Persistent prominent bilateral airspace disease, right side greater than left again noted without significant change. Right pleural effusion. No pneumothorax. IMPRESSION: 1. Lines and tubes in stable position. 2. Persistent prominent bilateral airspace disease, right side greater than left. Persistent right-sided pleural effusion. No change from prior exam. Electronically Signed   By: Marcello Moores  Register   On: 06/22/2017 06:59    PMH:   Past Medical History:  Diagnosis Date  . Diabetes mellitus without complication ([place])   . Hepatitis C   . Hypertension     PSH:   Past Surgical History:  Procedure Laterality Date  . FLEXIBLE SIGMOIDOSCOPY N/A 09/10/2015   Procedure: FLEXIBLE SIGMOIDOSCOPY;  Surgeon: Carol Ada, MD;  Location: [facility];  Service: Endoscopy;  Laterality: N/A;  . INCISION AND DRAINAGE ABSCESS Right 06/13/2017   Procedure: INCISION AND DRAINAGE OF RIGHT PARAPHARYNGEAL, FACIAL, EYELID, AND NECK ABCESSES;  Surgeon: Melida Quitter, MD;  Location: [facility];  Service: ENT;  Laterality: Right;  . INCISION AND DRAINAGE ABSCESS N/A 05/21/2017   Procedure: Debridement of Right NECK and FACIAL Abscess, Incision and Drainage Right Temporal Abscess, and Drainage of right PARAPHARYNGEAL  ABSCESS;  Surgeon: Melida Quitter, MD;  Location: [facility];  Service: ENT;  Laterality: N/A;  . TRACHEOSTOMY TUBE PLACEMENT N/A 06/13/2017   Procedure: TRACHEOSTOMY;  Surgeon: Melida Quitter, MD;  Location: [facility];  Service: ENT;  Laterality: N/A;  .  WOUND EXPLORATION Bilateral 06/27/2017   Procedure: WOUND EXPLORATION AND DRAINAGE FACE W/ DEBRIDMENT OF NECK;  Surgeon: Jodi Marble, MD;  Location: [facility];  Service: ENT;  Laterality: Bilateral;    Allergies: No Known Allergies  Medications:   Prior to Admission medications   Medication Sig Start Date End Date Taking? Authorizing Provider  clindamycin (CLEOCIN) 150 MG capsule Take 1 capsule (150 mg total) by mouth every 6 (six) hours. 06/03/17  Yes Lysbeth Penner, FNP  glipiZIDE (GLUCOTROL) 5 MG tablet Take 1 tablet (5 mg total) by mouth daily before breakfast. 06/03/17  Yes Oxford, Orson Ape, FNP  HYDROcodone-acetaminophen (NORCO/VICODIN) 5-325 MG tablet Take 1-2 tablets by mouth every 4 (four) hours as needed. 06/03/17  Yes Lysbeth Penner, FNP  metFORMIN (GLUCOPHAGE) 500 MG tablet Take 1 tablet (500 mg  total) by mouth 2 (two) times daily with a meal. 06/03/17  Yes Oxford, Orson Ape, FNP  Phenylephrine-APAP-Guaifenesin (TYLENOL SINUS SEVERE) 5-325-200 MG TABS Take 1-2 tablets by mouth daily as needed (pain).   Yes [provider]  azithromycin (ZITHROMAX) 250 MG tablet Take 2 tabs PO x 1 dose, then 1 tab PO QD x 4 days Patient not taking: Reported on 06/03/2017 03/09/16   Robyn Haber, MD  dicyclomine (BENTYL) 20 MG tablet Take 1 tablet (20 mg total) by mouth 2 (two) times daily. Patient not taking: Reported on 03/09/2016 09/14/15   Nona Dell, PA-C  docusate sodium (COLACE) 100 MG capsule Take 1 capsule (100 mg total) by mouth 2 (two) times daily. Patient not taking: Reported on 09/14/2015 09/12/15   Reyne Dumas, MD  HYDROcodone-homatropine [facility]) 5-1.5 MG/5ML syrup Take 5 mLs by mouth every 8 (eight) hours as needed for cough. Patient not taking: Reported on 05/29/2017 03/09/16   Robyn Haber, MD  omeprazole (PRILOSEC) 20 MG capsule Take 1 capsule (20 mg total) by mouth 2 (two) times daily before a meal. Patient not taking: Reported on 09/14/2015 04/05/15    Ivar Drape D, PA  oxyCODONE (OXY IR/ROXICODONE) 5 MG immediate release tablet Take 1 tablet (5 mg total) by mouth every 6 (six) hours as needed for moderate pain. Patient not taking: Reported on 03/09/2016 09/12/15   Reyne Dumas, MD  polyethylene glycol (MIRALAX / GLYCOLAX) packet Take 17 g by mouth daily as needed for mild constipation. Patient not taking: Reported on 03/09/2016 09/12/15   Reyne Dumas, MD  tamsulosin (FLOMAX) 0.4 MG CAPS capsule Take 1 capsule (0.4 mg total) by mouth daily. Patient not taking: Reported on 06/16/2017 03/09/16   Robyn Haber, MD    Discontinued Meds:   Medications Discontinued During This Encounter  Medication Reason  . fentaNYL (SUBLIMAZE) injection 25-100 mcg   . morphine 2 MG/ML injection 1-2 mg   . insulin aspart (novoLOG) injection 1-3 Units   . methylPREDNISolone sodium succinate (SOLU-MEDROL) 125 mg/2 mL injection 60 mg   . 0.9 %  sodium chloride infusion   . feeding supplement (JEVITY 1.2 CAL) liquid 1,000 mL   . morphine 2 MG/ML injection 1-4 mg   . morphine 4 MG/ML injection 1-4 mg   . feeding supplement (GLUCERNA 1.2 CAL) liquid 1,000 mL   . Ampicillin-Sulbactam (UNASYN) 3 g in sodium chloride 0.9 % 100 mL IVPB   . clindamycin (CLEOCIN) IVPB 600 mg   . piperacillin-tazobactam (ZOSYN) IVPB 3.375 g Dose change  . feeding supplement (GLUCERNA 1.2 CAL) liquid 1,500 mL   . insulin glargine (LANTUS) injection 10 Units   . insulin glargine (LANTUS) injection 10 Units   . HYDROmorphone (DILAUDID) injection 0.5-1 mg   . methylPREDNISolone sodium succinate (SOLU-MEDROL) 125 mg/2 mL injection 80 mg   . pantoprazole (PROTONIX) injection 40 mg   . insulin glargine (LANTUS) injection 14 Units   . 0.9 %  sodium chloride infusion   . dextrose 5 % solution   . feeding supplement (GLUCERNA 1.2 CAL) liquid 1,500 mL   . free water 200 mL   . lidocaine-EPINEPHrine (XYLOCAINE W/EPI) 1 %-1:100000 (with pres) injection Patient Discharge  . sodium  chloride irrigation 0.9 % Patient Discharge  . HYDROmorphone (DILAUDID) injection 0.5-1 mg   . oxyCODONE (ROXICODONE) 5 MG/5ML solution 5-10 mg   . fentaNYL (SUBLIMAZE) injection 25-50 mcg   . dextrose 5 %-0.9 % sodium chloride infusion   . fentaNYL (SUBLIMAZE) injection 100 mcg   .  fentaNYL (SUBLIMAZE) injection 100 mcg   . MEDLINE mouth rinse   . vancomycin (VANCOCIN) IVPB 750 mg/150 ml premix   . feeding supplement (VITAL HIGH PROTEIN) liquid 1,000 mL   . feeding supplement (PRO-STAT SUGAR FREE 64) liquid 30 mL   . methylPREDNISolone sodium succinate (SOLU-MEDROL) 125 mg/2 mL injection 60 mg   . insulin glargine (LANTUS) injection 18 Units   . piperacillin-tazobactam (ZOSYN) IVPB 3.375 g   . insulin glargine (LANTUS) injection 20 Units   . clindamycin (CLEOCIN) IVPB 900 mg   . vancomycin (VANCOCIN) IVPB 1000 mg/200 mL premix   . insulin aspart (novoLOG) injection 0-20 Units   . lidocaine-EPINEPHrine (XYLOCAINE W/EPI) 1 %-1:100000 (with pres) injection Patient Discharge  . sodium chloride irrigation 0.9 % Patient Discharge  . ondansetron (ZOFRAN) injection 4 mg Patient Transfer  . dextrose 5 % solution   . heparin injection 5,000 Units   . insulin aspart (novoLOG) injection 0-9 Units   . fentaNYL (SUBLIMAZE) injection 50 mcg Completed Course  . lidocaine-EPINEPHrine (XYLOCAINE W/EPI) 1 %-1:100000 (with pres) injection Patient Discharge  . 0.9 % irrigation (POUR BTL) Patient Discharge  . Ampicillin-Sulbactam (UNASYN) 3 g in sodium chloride 0.9 % 100 mL IVPB   . clindamycin (CLEOCIN) IVPB 900 mg   . furosemide (LASIX) injection 40 mg   . insulin aspart (novoLOG) injection 4 Units   . insulin glargine (LANTUS) injection 15 Units   . propofol (DIPRIVAN) 1000 MG/100ML infusion   . lactated ringers infusion   . meropenem (MERREM) 1 g in sodium chloride 0.9 % 100 mL IVPB   . clindamycin (CLEOCIN) IVPB 900 mg   . furosemide (LASIX) injection 40 mg   . mupirocin ointment (BACTROBAN) 2 %    . amiodarone (NEXTERONE) IV bolus only 150 mg/100 mL   . meropenem (MERREM) 1 g in sodium chloride 0.9 % 100 mL IVPB   . metoprolol tartrate (LOPRESSOR) injection 2.5-5 mg   . heparin injection 2,440 Units Duplicate  . ibuprofen (ADVIL,MOTRIN) 100 MG/5ML suspension 102 mg Duplicate  . propofol (DIPRIVAN) 1000 MG/100ML infusion   . acetaminophen (TYLENOL) suppository 650 mg   . 0.9 %  sodium chloride infusion   . 0.9 %  sodium chloride infusion   . bacitracin ointment 1 application   . dextrose 5 %-0.45 % sodium chloride infusion   . insulin aspart (novoLOG) injection 2-6 Units   . cefTAZidime (FORTAZ) 1 g in dextrose 5 % 50 mL IVPB   . cefTAZidime (FORTAZ) 1 g in dextrose 5 % 50 mL IVPB   . amiodarone (NEXTERONE PREMIX) 360-4.14 MG/200ML-% (1.8 mg/mL) IV infusion   . amiodarone (NEXTERONE PREMIX) 360-4.14 MG/200ML-% (1.8 mg/mL) IV infusion     Social History:  reports that he quit smoking about 21 years ago. He has never used smokeless tobacco. He reports that he drinks about 3.0 oz of alcohol per week . He reports that he does not use drugs.  Family History:  History reviewed. No pertinent family history.  Blood pressure (!) 105/52, pulse 78, temperature 98.9 F (37.2 C), temperature source Rectal, resp. rate 20, height 5' (1.524 m), weight 78.4 kg (172 lb 13.5 oz), SpO2 95 %. General appearance: Not sedated but trached Head: right side of eye/head bandaged Neck: no adenopathy, supple, symmetrical, trachea midline and thyroid not enlarged, symmetric, no tenderness/mass/nodules Back: negative Resp: clear to auscultation bilaterally Chest wall: no tenderness Cardio: regular rate and rhythm, S1, S2 normal, no murmur, click, rub or gallop GI: soft, non-tender; bowel  sounds normal; no masses,  no organomegaly Extremities: edema 1+ Pulses: 2+ and symmetric Skin: Skin color, texture, turgor normal. No rashes or lesions Lymph nodes: Cervical, supraclavicular, and axillary nodes  normal. Neurologic: Mental status: alertness: obtunded       LIN, Hunt Oris, MD 06/22/2017, 1:00 PM

## 2017-06-22 NOTE — Progress Notes (Signed)
Interpreter was called and helped with communication between MD, Nephrology, ENT, nurse and family. Upcoming procedure and plan of care was explained and answered all the questions.  Consent taken and signed.  Dressing was changed Pt went to CT. Bronch procedure done at the bedside after return.  VS stable, continue to monitor

## 2017-06-22 NOTE — Progress Notes (Signed)
Nutrition Follow-up  DOCUMENTATION CODES:   Not applicable  INTERVENTION:   Continue:  Glucerna 1.2 via NG tube (tip in stomach) at 45 ml/h (1080 ml/day)   Prostat 30 ml TID   Provides 1596 kcals, 110 gm protein, 869 ml free water daily.  NUTRITION DIAGNOSIS:   Inadequate oral intake related to inability to eat as evidenced by NPO status.  Ongoing  GOAL:   Patient will meet greater than or equal to 90% of their needs  Met with TF  MONITOR:   Vent status, TF tolerance, Labs, Weight trends, I & O's  ASSESSMENT:   61 yo male with PMH of DM, HTN, Hepatitis C who was admitted on 6/18 with facial swelling, dyspnea, and dysphagia related to R parotitis and facial cellulitis.  Discussed patient in ICU rounds and with RN today. Patient is currently receiving Glucerna 1.2 via NG tube (tip in stomach) at 45 ml/h (1080 ml/day) with Prostat 30 ml TID to provide 1596 kcals, 110 gm protein, 869 ml free water daily. TF is meeting 97% of re-estimated calorie needs and 100% of protein needs. Patient is currently on ventilator support; trach in place. MV: 8.1 L/min Temp (24hrs), Avg:98.8 F (37.1 C), Min:98.5 F (36.9 C), Max:99.8 F (37.7 C)  Propofol: off Labs reviewed: potassium 3.3 (L) CBG's: [phone] Medications reviewed and include KCl. Weight up 28 lb since admission; I/O +18.8 L since admission  Diet Order:  Diet NPO time specified  Skin:  Wound (see comment) (facial abscesses)  Last BM:  7/6  Height:   Ht Readings from Last 1 Encounters:  06/09/17 5' (1.524 m)    Weight:   Wt Readings from Last 1 Encounters:  06/22/17 172 lb 13.5 oz (78.4 kg)   06/06/17 144 lb 13.5 oz (65.7 kg)    Ideal Body Weight:  48.2 kg  BMI:  28.3  Estimated Nutritional Needs:   Kcal:  1639  Protein:  105-115 gm  Fluid:  1.8 L  EDUCATION NEEDS:   No education needs identified at this time  Molli Barrows, [place], [place], [place] Pager [phone] After Hours Pager  [phone]

## 2017-06-22 NOTE — Progress Notes (Signed)
PULMONARY / CRITICAL CARE MEDICINE   Name: [name] [name] MRN: [id number] DOB: 1956-02-25    ADMISSION DATE:  06/15/2017 CONSULTATION DATE:  06/09/2017  REFERRING MD:  Dr. Thereasa Solo  CHIEF COMPLAINT:  Right parapharyngeal and neck abscesses  HISTORY OF PRESENT ILLNESS:    61 y.o. male with PMH as below, which is significant for DM, HTN, and Hepatitis C. He was in his usual state of health until Friday 6/15 when he noticed some minor R sided facial swelling. This continued to worsen in the following days and he developed associated fevers/chills, dyspnea, and dysphagia. He presented to urgent care 6/17 and was prescribed Clindamycin. He took his first dose, but was unable to take any subsequent doses due to dysphagia. He presented to [facility] ED 6/18 for this complaint. He had CT scan done which demonstrated right parotitis and facial cellulitis. Due to concern for airway compromise, PCCM asked to evaluate for admission. He was found to have parapharyngeal abscess and underwent I&D and trach 6/25.  SUBJECTIVE:  Pneumonia worsening Some redness on chest again  VITAL SIGNS: BP (!) 119/56   Pulse 78   Temp 98.6 F (37 C) (Rectal)   Resp 20   Ht 5' (1.524 m)   Wt 78.4 kg (172 lb 13.5 oz)   SpO2 94%   BMI 33.76 kg/m   HEMODYNAMICS:    VENTILATOR SETTINGS: Vent Mode: PRVC FiO2 (%):  [40 %] 40 % Set Rate:  [20 bmp] 20 bmp Vt Set:  [400 mL] 400 mL PEEP:  [5 cmH20-10 cmH20] 10 cmH20 Plateau Pressure:  [24 cmH20] 24 cmH20  INTAKE / OUTPUT: I/O last 3 completed shifts: In: 3306.7 [I.V.:1196.7; Blood:335; NG/GT:1575; IV Piggyback:200] Out: 2760 [Urine:2635; Stool:125]  PHYSICAL EXAMINATION:   General:  Sedated, in bed HENT: facial dressings in place, erythema skin with satellite lesions PULM: CTA B, normal effort, vent supported breathing CV: RRR, no mgr GI: BS+, soft, nontender MSK: normal bulk and tone Massive edema bilaterally Neuro: sedated on vent   LABS:  BMET  Recent  Labs Lab 06/20/17 0419 06/21/17 0540 06/22/17 0548  NA 141 143 145  K 3.3* 3.9 3.3*  CL 104 106 107  CO2 _0 BUN 66* 103* 120*  CREATININE 1.83* 2.79* 3.25*  GLUCOSE 218* 230* 135*    Electrolytes  Recent Labs Lab 06/18/17 1230 06/19/17 0345 06/20/17 0419 06/21/17 0540 06/22/17 0548  CALCIUM  --  6.8* 7.0* 7.3* 7.6*  MG 1.9 2.1  --  2.5*  --   PHOS 6.9* 7.4*  --  7.0*  --     CBC  Recent Labs Lab 06/21/17 0540 06/21/17 1749 06/22/17 0548  WBC 8.6 7.9 7.7  HGB 7.0* 6.6* 8.1*  HCT 22.0* 20.5* 25.6*  PLT 68* 66* 76*    Coag's  Recent Labs Lab 06/15/17 1125 06/16/17 0407  APTT 53*  --   INR 1.40 1.30    Sepsis Markers No results for input(s): LATICACIDVEN, PROCALCITON, O2SATVEN in the last 168 hours.  ABG  Recent Labs Lab 07/05/2017 0841 06/18/17 0335 06/18/17 1208  PHART 7.425 7.334* 7.330*  PCO2ART 44.6 46.2 49.5*  PO2ART 384.0* 73.7* 71.0*    Liver Enzymes  Recent Labs Lab 06/18/17 0400 06/20/17 0419 06/21/17 0540 06/22/17 0548  AST 65* 124*  --  120*  ALT 15* 22  --  22  ALKPHOS 107 219*  --  302*  BILITOT 1.5* 3.6*  --  8.0*  ALBUMIN <1.0* <1.0* <1.0* <1.0*  Cardiac Enzymes No results for input(s): TROPONINI, PROBNP in the last 168 hours.  Glucose  Recent Labs Lab 06/21/17 1141 06/21/17 1550 06/21/17 1936 06/21/17 2334 06/22/17 0332 06/22/17 0748  GLUCAP 184* 208* 244* 190* 137* 167*   Imaging Ct Soft Tissue Neck W Contrast  Result Date: 06/06/2017 CLINICAL DATA:  Followup parotiditis. EXAM: CT NECK WITH CONTRAST TECHNIQUE: Multidetector CT imaging of the neck was performed using the standard protocol following the bolus administration of intravenous contrast. CONTRAST:  75 cc Isovue-300 COMPARISON:  06/08/2017, 05/25/2017 FINDINGS: There is further worsening by imaging. Patient appears to have diffuse suppurative inflammation of the right parotid and submandibular glands. Diffuse cellulitis/abscess formation  throughout the right face and deep spaces with specific findings as below. Low-density fluid, presumably abscess, superficial to the right parotid gland with thickness measuring up to 16 mm. Low-density fluid, presumably abscess, right periorbital preseptal region measuring up to 2 cm in thickness. More superficial subcutaneous fluid in the right cheek measuring up to 17 mm in thickness. Right parapharyngeal space abscess much larger, measuring 3.5 x 2.7 cm and extending over a length of almost 6 cm, with mass effect upon the oropharynx and hypopharynx. Abscess within the right submandibular gland extending into the sublingual region. No sign of intracranial extension in the limited intracranial images. No sign of postseptal orbital inflammation on the limited images. Diffuse mucosal inflammation of the right maxillary sinus. No layering fluid. Some dental and periodontal disease, but without advanced periodontal disease/abscess of bone. IMPRESSION: Continued worsening of inflammatory disease of the right face and deep spaces. Most notable changes include enlargement of the right parapharyngeal abscess now measuring up to 3.5 cm in diameter and extending over a length of almost 6 cm with worsened mass effect upon the hypopharynx and pharynx. See above for description of other locations of involvement/extension. Electronically Signed   By: Nelson Chimes M.D.   On: 06/03/2017 09:17    STUDIES:  CT Neck 6/18 > Diffuse facial swelling favored to represent bacterial RIGHT parotitis. Significant facial cellulitis, with RIGHT muscle of mastication enlargement. No visible calculi. No drainable abscess. Minor dental caries and periapical lucencies, not felt to be the source of inflammation. CXR 6/22 > Low lung volumes with right basilar atelectasis. Upper normal heart size likely exaggerated by technique and low lung volumes. Left lung base and costophrenic angle not included in the field of view. Frontal and lateral  views may be helpful if patient is able. CT neck 6/22 > Extensive severe multi compartmental infectious/inflammatory process involving the right face from the right periorbital soft tissues to the upper neck. Inflammation involves the all right-sided deep cervical compartments including right parapharyngeal and retropharyngeal spaces. There multiple ill-defined fluid collections probably representing phlegmon. No discrete rim enhancing abscess is Identified. There is rightward displacement of the airway and moderate narrowing of the airway due to fluid collections in parapharyngeal space and swelling of oral and hypopharyngeal mucosa. The origin of infection is uncertain, possibly odontogenic given the presence of odontogenic disease. CT Neck 6/25 > Continued worsening of inflammatory disease of the right face and deep spaces. Most notable changes include enlargement of the right parapharyngeal abscess now measuring up to 3.5 cm in diameter and extending over a length of almost 6 cm with worsened mass effect upon the hypopharynx and pharynx.  6/27 CxR with ft needing advancement.  CULTURES: Blood 6/18 > No growth  MRSA PCR 6/18 > Negative  MRSA PCP 6/25 > Negative Staphy aureus 6/25 > Negative  Aerobic/Anareboic Culture 6/25 >> Kleb P. Res to ampicillin  6.28 BC>>>NGTD 7/1 OR>>>klebsiella Bronch bal 7/2>>>stain gram neg rod>>> Klebsiella  ANTIBIOTICS: Per ID Clindamycin 6/18 > 6/23>>> Unasyn 6/18 > 6/22 Vancomycin 6/23 >>>6/28 unasyn 6/27>>>7/1 Zosyn 6/23 >>>6/27 merom 7/1>7/3 Ceftazidine 7/3 >>>  IVIG 7/1>>> x 1, further held crt rise  SIGNIFICANT EVENTS: 6/18 > Presents to ED 6/25 > Taken to OR for I&D and Trach  6/29- fevers, vent 6/30- bleeding, blood, cauterized bedside 7/1 fib rvr, borderline bP, to OR  LINES/TUBES: Trach 6/25 >>  L femoral CVC 6/25 >>> Left fem cvc 7/1>>>  picc 6/27>>> R Aline ( in OR 7/1) >>>  DISCUSSION: 61 year old male presents to ED on 6/18  with right facial cellulitis/parotitis and parapharyngeal abscess s/p I&D and trach on 6/25. 6/28 lighten sedation and wean from vent as tolerated. Lantus stopped and D5W started as FT malpositioned and tube feeds stopped coupled with stopping steroids has led to hypoglycemia.  ASSESSMENT / PLAN:  PULMONARY A: Respiratory Insufficieny in setting of facial cellulitis/focal abscess s/p trach  Acute respiratory failure with hypoxemia due to HCAP P:   Re-bronch today for BAL Full vent support VAP prevention See ID  CARDIOVASCULAR A:  AF RVR, now sinus HTN P:  Telemetry monitoring Change AMIO to po  A:   AKI > worsening  P:   Renal consult KVO fluids Monitor BMET and UOP Replace electrolytes as needed   GASTROINTESTINAL A:   Hep C genotype 1 with splenomegaly - ? Untreated Protein energy malnutition P:   PPI Continue tube feedings  HEMATOLOGIC A:   Anemia> s/p transfusion PRBC 7/5 P:  Monitor for bleeding  INFECTIOUS A:   Right Acute Parotitis with extensive facial cellulitis and focal right parapharyngeal abscess s/p I&D with Klebsiella P. Res to ampicillin - necrotizing Klebsiella PNA > worsening but paradoxically his WBC and fever are better Fungus on biopsy? Just topical candida  P:   Continue dressing changes Continue cefazolin Re-BAL today for new/different lung pathogen? Add topical nystatin powder  ENDOCRINE A:   DM with current hyperglycemia P:   Continue SSI  NEUROLOGIC A:   Acute Encephalopathy secondary to sedation  Pain severe, requiring limited WUA P:   RASS goal -3 Continue versed/fentanyl WUA daily> important to keep these  [place] family meet or Palliative Care meeting due by:  06/18/17 - done DF , ENT  Update: Wife updated 7/5 by [place]  My cc time 31 minutes  Roselie Awkward, MD [place] PCCM Pager: [phone] Cell: [phone] After 3pm or if no response, call [phone]   06/22/2017 10:20  AM

## 2017-06-22 NOTE — Progress Notes (Signed)
   Subjective:    Patient ID: [name] [name], male    DOB: Apr 13, 1956, 61 y.o.   MRN: [id number]  HPI Remains sedated on ventilator.  Review of Systems     Objective:   Physical Exam Tm 100.2 VSS Sedated. Changed dressings: some liquefaction in right temporal space and surrounding right parotid gland anteriorly, some dark necrosis against right nasal sidewall.  Replaced Kerlex packing in right temple, right lateral neck, right premaxilla, right upper and lower eyelids, submental neck, and left neck.    Assessment & Plan:  Right parotid, neck, eyelids, temporal, submental, and left neck abscesses/necrotizing fasciitis.  WBC has normalized and not spiking high fevers.  Tissues in depths of wounds still with some liquefaction.  Will continue twice daily dressing changes.  May need further debridement in operating room but overall infection picture improving.

## 2017-06-22 NOTE — Progress Notes (Signed)
Patient ID: [name] [name], male   DOB: [birth year], 61 y.o.   MRN: [id number]          [facility]    Date of Admission:  09-28-2017   Total days of antibiotics 19        Day 2 cefazolin  He has severe head and neck soft tissue infection with abscesses and pneumonia caused by Klebsiella. Although the Klebsiella is quite antibiotic sensitive it is obviously virulent incapable of necrotizing infection. The surgical path report from neck tissue revealed:  "Soft tissue, debridement, Necrotic facial tissue FIBROUS ADIPOSE TISSUE WITH SUPPURATIVE INFLAMMATION AND NECROSIS NECROTIC SQUAMOUS EPITHELIUM WITH FUNGAL INFECTION (PAS/F STAIN)"  I reviewed these findings with the pathologist, Dr. Maurice MarchLane. She reported that fungal hyphae compatible with Candida species were noted in superficial skin around hair follicles. No fungal elements were seen in deep tissue. I think this represents simple superficial colonization and not invasive fungal infection. I do not feel that it needs any treatment.  I discussed the situation with Dr. Kendrick FriesMcQuaid. I will continue cefazolin for now but he will perform repeat bronchoscopy to see if there is any other superimposed pathogen contributing to the worsening right-sided pneumonia.         Cliffton AstersJohn Alanis Clift, MD [facility] [phone] pager   [phone] cell 06/22/2017, 9:56 AM

## 2017-06-22 NOTE — Procedures (Signed)
PCCM Video Bronchoscopy Procedure Note  The patient was informed of the risks (including but not limited to bleeding, infection, respiratory failure, lung injury, tooth/oral injury) and benefits of the procedure and gave consent, see chart.  Indication: HCAP which is worsening  Post Procedure Diagnosis: same  [facility]  Condition pre procedure: critically ill on vent  Medications for procedure: fentanyl gtt, versed gtt  Procedure description: The bronchoscope was introduced through the tracheostomy and passed to the bilateral lungs to the level of the subsegmental bronchi throughout the tracheobronchial tree.  Airway exam revealed thick bloody and purulent secretions in the left lower lobe and in the RUL and RLL.  These were suctioned for clearance and saved for culture.  Procedures performed: suctioning of airway secretions  Specimens sent: bronchial aspiration for culture: fungal, bacterial, AFB  Condition post procedure: critically ill on vent  EBL: none from procedure  Complications: none immediate  Heber CarolinaBrent McQuaid, MD [place] PCCM Pager: [phone] Cell: [phone] After 3pm or if no response, call [phone]

## 2017-06-23 ENCOUNTER — Inpatient Hospital Stay (HOSPITAL_COMMUNITY): Payer: 59

## 2017-06-23 DIAGNOSIS — N179 Acute kidney failure, unspecified: Secondary | ICD-10-CM

## 2017-06-23 DIAGNOSIS — R17 Unspecified jaundice: Secondary | ICD-10-CM

## 2017-06-23 LAB — CBC WITH DIFFERENTIAL/PLATELET
BASOS PCT: 0 %
Basophils Absolute: 0 10*3/uL (ref 0.0–0.1)
EOS ABS: 0 10*3/uL (ref 0.0–0.7)
Eosinophils Relative: 0 %
HCT: 23.8 % — ABNORMAL LOW (ref 39.0–52.0)
HEMOGLOBIN: 7.7 g/dL — AB (ref 13.0–17.0)
Lymphocytes Relative: 5 %
Lymphs Abs: 0.6 10*3/uL — ABNORMAL LOW (ref 0.7–4.0)
MCH: 27.7 pg (ref 26.0–34.0)
MCHC: 32.4 g/dL (ref 30.0–36.0)
MCV: 85.6 fL (ref 78.0–100.0)
MONOS PCT: 5 %
Monocytes Absolute: 0.6 10*3/uL (ref 0.1–1.0)
NEUTROS PCT: 90 %
Neutro Abs: 11 10*3/uL — ABNORMAL HIGH (ref 1.7–7.7)
Platelets: 125 10*3/uL — ABNORMAL LOW (ref 150–400)
RBC: 2.78 MIL/uL — ABNORMAL LOW (ref 4.22–5.81)
RDW: 16.8 % — AB (ref 11.5–15.5)
WBC: 12.3 10*3/uL — AB (ref 4.0–10.5)

## 2017-06-23 LAB — GLUCOSE, CAPILLARY
GLUCOSE-CAPILLARY: 158 mg/dL — AB (ref 65–99)
GLUCOSE-CAPILLARY: 216 mg/dL — AB (ref 65–99)
GLUCOSE-CAPILLARY: 159 mg/dL — AB (ref 65–99)
Glucose-Capillary: 228 mg/dL — ABNORMAL HIGH (ref 65–99)
Glucose-Capillary: 197 mg/dL — ABNORMAL HIGH (ref 65–99)

## 2017-06-23 LAB — BPAM RBC
Blood Product Expiration Date: 201808012359
ISSUE DATE / TIME: 201807060203
Unit Type and Rh: 7300

## 2017-06-23 LAB — RENAL FUNCTION PANEL
ANION GAP: 10 (ref 5–15)
BUN: 125 mg/dL — ABNORMAL HIGH (ref 6–20)
CALCIUM: 7.3 mg/dL — AB (ref 8.9–10.3)
CO2: 25 mmol/L (ref 22–32)
CREATININE: 3.77 mg/dL — AB (ref 0.61–1.24)
Chloride: 106 mmol/L (ref 101–111)
GFR, EST AFRICAN AMERICAN: 19 mL/min — AB (ref >60)
GFR, EST NON AFRICAN AMERICAN: 16 mL/min — AB (ref >60)
Glucose, Bld: 205 mg/dL — ABNORMAL HIGH (ref 65–99)
PHOSPHORUS: 6.8 mg/dL — AB (ref 2.5–4.6)
Potassium: 4.5 mmol/L (ref 3.5–5.1)
SODIUM: 141 mmol/L (ref 135–145)

## 2017-06-23 LAB — TYPE AND SCREEN
ABO/RH(D): B POS
ANTIBODY SCREEN: NEGATIVE
Unit division: 0

## 2017-06-23 LAB — BASIC METABOLIC PANEL
Anion gap: 12 (ref 5–15)
BUN: 148 mg/dL — ABNORMAL HIGH (ref 6–20)
CALCIUM: 7.7 mg/dL — AB (ref 8.9–10.3)
CO2: 25 mmol/L (ref 22–32)
CREATININE: 4.25 mg/dL — AB (ref 0.61–1.24)
Chloride: 107 mmol/L (ref 101–111)
GFR calc non Af Amer: 14 mL/min — ABNORMAL LOW (ref >60)
GFR, EST AFRICAN AMERICAN: 16 mL/min — AB (ref >60)
Glucose, Bld: 200 mg/dL — ABNORMAL HIGH (ref 65–99)
Potassium: 4.4 mmol/L (ref 3.5–5.1)
SODIUM: 144 mmol/L (ref 135–145)

## 2017-06-23 LAB — HEPATIC FUNCTION PANEL
ALK PHOS: 292 U/L — AB (ref 38–126)
AST: 89 U/L — AB (ref 15–41)
Albumin: <1 g/dL — ABNORMAL LOW (ref 3.5–5.0)
BILIRUBIN DIRECT: 5.9 mg/dL — AB (ref 0.1–0.5)
Indirect Bilirubin: 2.5 mg/dL — ABNORMAL HIGH (ref 0.3–0.9)
Total Bilirubin: 8.4 mg/dL — ABNORMAL HIGH (ref 0.3–1.2)
Total Protein: 6 g/dL — ABNORMAL LOW (ref 6.5–8.1)

## 2017-06-23 LAB — ACID FAST SMEAR (AFB): ACID FAST SMEAR - AFSCU2: NEGATIVE

## 2017-06-23 MED ORDER — INSULIN ASPART 100 UNIT/ML ~~LOC~~ SOLN
6.0000 [IU] | SUBCUTANEOUS | Status: DC
  Administered 2017-06-23: 9 [IU] via SUBCUTANEOUS
  Administered 2017-06-23: 5 [IU] via SUBCUTANEOUS

## 2017-06-23 MED ORDER — PHENYLEPHRINE HCL 10 MG/ML IJ SOLN
0.0000 ug/min | INTRAMUSCULAR | Status: DC
  Administered 2017-06-23: 150 ug/min via INTRAVENOUS
  Administered 2017-06-23: 100 ug/min via INTRAVENOUS
  Administered 2017-06-24: 350 ug/min via INTRAVENOUS
  Administered 2017-06-24 (×2): 400 ug/min via INTRAVENOUS
  Administered 2017-06-24: 150 ug/min via INTRAVENOUS
  Administered 2017-06-24: 100 ug/min via INTRAVENOUS
  Administered 2017-06-24: 200 ug/min via INTRAVENOUS
  Filled 2017-06-23 (×5): qty 4
  Filled 2017-06-23: qty 40
  Filled 2017-06-23 (×3): qty 4

## 2017-06-23 MED ORDER — INSULIN ASPART 100 UNIT/ML ~~LOC~~ SOLN
2.0000 [IU] | SUBCUTANEOUS | Status: DC
  Administered 2017-06-23: 3 [IU] via SUBCUTANEOUS
  Administered 2017-06-24 (×3): 2 [IU] via SUBCUTANEOUS

## 2017-06-23 MED ORDER — SODIUM CHLORIDE 0.9 % IV SOLN
0.0000 ug/min | INTRAVENOUS | Status: DC
  Filled 2017-06-23: qty 2

## 2017-06-23 MED ORDER — SODIUM CHLORIDE 0.9 % IV SOLN
0.0000 ug/min | INTRAVENOUS | Status: DC
  Administered 2017-06-23: 20 ug/min via INTRAVENOUS
  Administered 2017-06-23: 100 ug/min via INTRAVENOUS
  Administered 2017-06-23: 50 ug/min via INTRAVENOUS
  Filled 2017-06-23: qty 1
  Filled 2017-06-23: qty 10
  Filled 2017-06-23 (×2): qty 1

## 2017-06-23 MED ORDER — CIPROFLOXACIN IN D5W 400 MG/200ML IV SOLN
400.0000 mg | Freq: Two times a day (BID) | INTRAVENOUS | Status: DC
  Administered 2017-06-23 – 2017-06-25 (×4): 400 mg via INTRAVENOUS
  Filled 2017-06-23 (×5): qty 200

## 2017-06-23 MED ORDER — PRISMASOL BGK 4/2.5 32-4-2.5 MEQ/L IV SOLN
INTRAVENOUS | Status: DC
  Administered 2017-06-23 – 2017-06-25 (×6): via INTRAVENOUS_CENTRAL
  Filled 2017-06-23 (×7): qty 5000

## 2017-06-23 MED ORDER — INSULIN ASPART 100 UNIT/ML ~~LOC~~ SOLN: 2.0000 [IU] | SUBCUTANEOUS | Status: DC

## 2017-06-23 MED ORDER — DEXTROSE 5 % IV SOLN
2.0000 g | Freq: Two times a day (BID) | INTRAVENOUS | Status: DC
  Administered 2017-06-23 – 2017-06-25 (×4): 2 g via INTRAVENOUS
  Filled 2017-06-23 (×5): qty 2

## 2017-06-23 MED ORDER — HEPARIN SODIUM (PORCINE) 1000 UNIT/ML DIALYSIS
1000.0000 [IU] | INTRAMUSCULAR | Status: DC | PRN
Start: 2017-06-23 — End: 2017-06-25
  Administered 2017-06-23: 3200 [IU] via INTRAVENOUS_CENTRAL
  Filled 2017-06-23: qty 3
  Filled 2017-06-23: qty 6
  Filled 2017-06-23: qty 1

## 2017-06-23 MED ORDER — PRISMASOL BGK 4/2.5 32-4-2.5 MEQ/L IV SOLN
INTRAVENOUS | Status: DC
  Administered 2017-06-23 – 2017-06-25 (×5): via INTRAVENOUS_CENTRAL
  Filled 2017-06-23 (×4): qty 5000

## 2017-06-23 MED ORDER — HEPARIN (PORCINE) 2000 UNITS/L FOR CRRT: INTRAVENOUS_CENTRAL | Status: DC | PRN

## 2017-06-23 MED ORDER — PRISMASOL BGK 4/2.5 32-4-2.5 MEQ/L IV SOLN
INTRAVENOUS | Status: DC
  Administered 2017-06-23 – 2017-06-25 (×13): via INTRAVENOUS_CENTRAL
  Filled 2017-06-23 (×19): qty 5000

## 2017-06-23 MED ORDER — INSULIN GLARGINE 100 UNIT/ML ~~LOC~~ SOLN: 15.0000 [IU] | Freq: Every day | SUBCUTANEOUS | Status: DC

## 2017-06-23 NOTE — Progress Notes (Signed)
[name] for Infectious Disease    Date of Admission:  05/30/2017   Total days of antibiotics 20        Day 2 cefazolin           ID: [name] [name] is a 61 y.o. male with severe head/neck soft tissue infection with necrotizing features and para pharyngeal abscess s/p multiple debridement. cx showing sensitive klebsiella but also developing severe R>L LL pneumonia and contrast induced AKI Active Problems:   Acute suppurative parotitis   Facial cellulitis   Respiratory failure (HCC)   Acute chest pain   Status post tracheostomy ([place])   Parapharyngeal abscess   Klebsiella infection   Necrotizing fasciitis (HCC)   Chronic hepatitis C without hepatic coma (HCC)   Acute respiratory failure with hypoxemia ([place])   HCAP (healthcare-associated pneumonia)    Subjective: Fever to 103F last night. Underwent bronch that showed bloody/cloudy lavage yesterday. CXR worsening on left LL this morning per my review. Undergoing bedside dressing changes. RN reported patient having oozing for 2 hrs after bedside dressing change/removal of slough  ROS: unable to obtain due to sedation,trach  Medications:  . amiodarone  400 mg Per Tube BID  . chlorhexidine gluconate (MEDLINE KIT)  15 mL Mouth Rinse BID  . Chlorhexidine Gluconate Cloth  6 each Topical Daily  . feeding supplement (PRO-STAT SUGAR FREE 64)  30 mL Per Tube TID  . heparin subcutaneous  5,000 Units Subcutaneous Q8H  . insulin aspart  2-6 Units Subcutaneous Q4H  . insulin aspart  6 Units Subcutaneous Q4H  . labetalol  10 mg Intravenous Once  . mouth rinse  15 mL Mouth Rinse 10 times per day  . nystatin   Topical BID  . pantoprazole sodium  40 mg Per Tube Daily  . sodium chloride flush  10-40 mL Intracatheter Q12H    Objective: Vital signs in last 24 hours: Temp:  [98.9 F (37.2 C)-103.2 F (39.6 C)] 100.3 F (37.9 C) (07/07 0600) Pulse Rate:  [70-127] 78 (07/07 1000) Resp:  [17-41] 23 (07/07 1000) BP: (84-122)/(42-59) 104/50  (07/07 1000) SpO2:  [93 %-98 %] 95 % (07/07 1000) FiO2 (%):  [40 %] 40 % (07/07 0731) Weight:  [165 lb 9.1 oz (75.1 kg)] 165 lb 9.1 oz (75.1 kg) (07/07 0446)  gen = face/head is wrapped from dressing changes Neck = trach in place pulm = rhonchi bilaterally Cors= nl s1,s2, no g/m/r Abd= decrease breath sounds,  Ext = edematous from anasarca Skin= jaundice, most noticeable in legs  Lab Results  Recent Labs  06/22/17 0548 06/23/17 0616  WBC 7.7 12.3*  HGB 8.1* 7.7*  HCT 25.6* 23.8*  NA 145 144  K 3.3* 4.4  CL 107 107  CO2 26 25  BUN 120* 148*  CREATININE 3.25* 4.25*   Liver Panel  Recent Labs  06/21/17 0540 06/22/17 0548  PROT  --  6.5  ALBUMIN <1.0* <1.0*  AST  --  120*  ALT  --  22  ALKPHOS  --  302*  BILITOT  --  8.0*    Microbiology: 7/7 blood cx pending 7/7 urine cx pending 7/6 BAL pending- few GNR 7/2 BAL kleb pneumo 7/1 abscess/head - kleb pneumo 6/28 blood cx ngtd 6/25 tissue cx- kleb pneumo 6/18 blood cx NGTD  Studies/Results: Ct Chest Wo Contrast  Result Date: 06/22/2017 CLINICAL DATA:  Pneumonia EXAM: CT CHEST WITHOUT CONTRAST TECHNIQUE: Multidetector CT imaging of the chest was performed following the standard protocol without IV  contrast. COMPARISON:  Chest radiographs June 22, 2017, June 20, 2017, and June 11, 2017 FINDINGS: Cardiovascular: There is no appreciable thoracic aortic aneurysm. Visualized great vessels appear normal. There is atherosclerotic calcification in the aorta. Pericardium is not appreciably thickened. Mediastinum/Nodes: Visualized thyroid appears unremarkable. There are subcentimeter mediastinal lymph nodes. No frank adenopathy is appreciable by size criteria. Nasogastric tube extends through the esophagus with tip in the stomach. Lungs/Pleura: There is extensive airspace consolidation throughout the entire right lower lobe and much of the right middle lobe. A portion of the more medial and anterior aspects of the right middle lobe  are aerated. There is consolidation in portions of the right upper lobe posteriorly. On the left, there is consolidation throughout much of the left lower lobe as well as in the inferior lingula and posterior segments of the left upper lobe. There is a minimal pleural effusion on the left. There are scattered nodular areas in the right upper lobe, right middle lobe, and left lower lobe without appreciable cavitation. The largest of these nodular lesions measures slightly greater than 1 cm. Tracheostomy catheter tip is in the distal trachea. No pneumothorax. Upper Abdomen: Despite the presence of nasogastric tube, stomach is distended with fluid and air. Visualized upper abdominal structures otherwise appear unremarkable. Musculoskeletal: There is degenerative change in the thoracic spine. There are no blastic or lytic bone lesions. IMPRESSION: 1. Widespread airspace consolidation bilaterally, more severe on the right than on the left. Multiple nodular opacities raise concern for septic emboli, although currently there is no cavitation in these lesions. There is a minimal left pleural effusion. 2.  No appreciable adenopathy by size criteria. 3.  Tube positions as described without pneumothorax. 4.  Areas of aortic atherosclerosis. Aortic Atherosclerosis (ICD10-I70.0). Electronically Signed   By: Lowella Grip III M.D.   On: 06/22/2017 14:00   Dg Chest Port 1 View  Result Date: 06/23/2017 CLINICAL DATA:  Initial evaluation for acute respiratory failure with hypoxemia. EXAM: PORTABLE CHEST 1 VIEW COMPARISON:  Prior radiograph from 06/22/2017. FINDINGS: Tracheostomy tube overlies the upper airway. Feeding tube courses in the the abdomen. Right-sided PICC catheter in place with tip overlying the distal SVC. Stable cardiomegaly. Mediastinal silhouette within normal limits. Lungs mildly hypoinflated. Bilateral airspace opacities, right worse than left, overall similar to previous, and concerning for multifocal  infection/pneumonia. Superimposed bilateral pleural effusions. Underlying pulmonary vascular congestion without overt pulmonary edema. No pneumothorax. Osseous structures unchanged. IMPRESSION: 1. Support apparatus in satisfactory position as above. 2. No appreciable interval changed in multifocal bilateral airspace opacities, right worse than left, again concerning for infection/pneumonia. 3. Superimposed small bilateral pleural effusions. Electronically Signed   By: Jeannine Boga M.D.   On: 06/23/2017 06:46   Dg Chest Port 1 View  Result Date: 06/22/2017 CLINICAL DATA:  Pneumonia. EXAM: PORTABLE CHEST 1 VIEW COMPARISON:  06/20/2017. FINDINGS: Tracheostomy tube, feeding tube, right PICC line stable position. Heart size stable. Persistent prominent bilateral airspace disease, right side greater than left again noted without significant change. Right pleural effusion. No pneumothorax. IMPRESSION: 1. Lines and tubes in stable position. 2. Persistent prominent bilateral airspace disease, right side greater than left. Persistent right-sided pleural effusion. No change from prior exam. Electronically Signed   By: Marcello Moores  Register   On: 06/22/2017 06:59     Assessment/Plan: 61yo M with history of chronic hep c with signs of cirrhosis on imaging admitted originally for parotitis but evolved to necrotizing deep tissue infection of head and neck c/b respiratory distress, secondary pneumonia,  AKI now on renal replacement therapy, anemia, and afib. Over the last 3 days having worsening jaundice and pneumonia is also worsening, underwent bronch. Febrile and worsening leukocytosis < 24hrs.   Perplexing case since all cultures have shown is a sensitive klebsiella isolate that he has had proper antibiotics, and has repeated I x D by ENT for source control. I suspect his underlying liver disease is playing part of his poor immune response to this infection. Also, it is interesting that klebsiella is causing this  degree with skin/soft tissue infection, rather than staph or strep species    Deep tissue infection of head/neck = currently on cefazolin but also sensitive to ceftaz. Will switch to ceftaz temporarily due to worsening fevers and leukocytosis - while we see if any new data comes from recent repeat cultures  pneumonia= currently on cefazolin but having some progression with pulmonary infection that also isolated senstive klebsiella species. Will plan to change to ceftaz while BAL cx are pending  Anemia = still has blood loss associated with wounds, recommend to check cbcthis afternoon to see whether needs rbc tsf. Will check inr/pt  afib with RVR = continues to be on amio.appears in sinus rhythm on tele  Jaundice = total bili at 8. Will add on hepatic panel, check direct bili.   AKI = Cr continues to rise up to 4.25 does not appear to have peaked. Nephrology on board monitoring UOP, planning to start RRT.  Anasarca = related to low serum albumin,3rd spacing in setting of sepsis  Chronic hep c without hepatic coma = geno 1a, with viremia. ast elevated> alt in recent labs    [facility], [facility] for Infectious Diseases Cell: [phone] Pager: [phone]  06/23/2017, 10:54 AM

## 2017-06-23 NOTE — Progress Notes (Signed)
6 Days Post-Op   Subjective/Chief Complaint: No change   Objective: Vital signs in last 24 hours: Temp:  [98.9 F (37.2 C)-103.2 F (39.6 C)] 100.3 F (37.9 C) (07/07 0600) Pulse Rate:  [78-127] 79 (07/07 0700) Resp:  [17-39] 21 (07/07 0700) BP: (84-122)/(42-59) 108/55 (07/07 0700) SpO2:  [93 %-98 %] 95 % (07/07 0700) FiO2 (%):  [40 %] 40 % (07/07 0400) Weight:  [75.1 kg (165 lb 9.1 oz)] 75.1 kg (165 lb 9.1 oz) (07/07 0446) Last BM Date: 06/22/17  Intake/Output from previous day: 07/06 0701 - 07/07 0700 In: 1615.1 [I.V.:470.1; NG/GT:1035; IV Piggyback:100] Out: 995 [Urine:995] Intake/Output this shift: No intake/output data recorded.  the wound was unpacked and ther was no purulent tissue in the temporalis area. few pieces removed. the facial area had multiple areas of necrosis that was removed. some puru7lent tissue in the lower neck area that was finger dissected and then debrided. the eye area had some necrotic tissue removed but no purulent tissue. The left neck without significant necrosis. All areas packed with saline gauze and dressed.   Lab Results:   Recent Labs  06/22/17 0548 06/23/17 0616  WBC 7.7 12.3*  HGB 8.1* 7.7*  HCT 25.6* 23.8*  PLT 76* 125*   BMET  Recent Labs  06/22/17 0548 06/23/17 0616  NA 145 144  K 3.3* 4.4  CL 107 107  CO2 26 25  GLUCOSE 135* 200*  BUN 120* 148*  CREATININE 3.25* 4.25*  CALCIUM 7.6* 7.7*   PT/INR No results for input(s): LABPROT, INR in the last 72 hours. ABG No results for input(s): PHART, HCO3 in the last 72 hours.  Invalid input(s): PCO2, PO2  Studies/Results: Ct Chest Wo Contrast  Result Date: 06/22/2017 CLINICAL DATA:  Pneumonia EXAM: CT CHEST WITHOUT CONTRAST TECHNIQUE: Multidetector CT imaging of the chest was performed following the standard protocol without IV contrast. COMPARISON:  Chest radiographs June 22, 2017, June 20, 2017, and June 11, 2017 FINDINGS: Cardiovascular: There is no appreciable thoracic  aortic aneurysm. Visualized great vessels appear normal. There is atherosclerotic calcification in the aorta. Pericardium is not appreciably thickened. Mediastinum/Nodes: Visualized thyroid appears unremarkable. There are subcentimeter mediastinal lymph nodes. No frank adenopathy is appreciable by size criteria. Nasogastric tube extends through the esophagus with tip in the stomach. Lungs/Pleura: There is extensive airspace consolidation throughout the entire right lower lobe and much of the right middle lobe. A portion of the more medial and anterior aspects of the right middle lobe are aerated. There is consolidation in portions of the right upper lobe posteriorly. On the left, there is consolidation throughout much of the left lower lobe as well as in the inferior lingula and posterior segments of the left upper lobe. There is a minimal pleural effusion on the left. There are scattered nodular areas in the right upper lobe, right middle lobe, and left lower lobe without appreciable cavitation. The largest of these nodular lesions measures slightly greater than 1 cm. Tracheostomy catheter tip is in the distal trachea. No pneumothorax. Upper Abdomen: Despite the presence of nasogastric tube, stomach is distended with fluid and air. Visualized upper abdominal structures otherwise appear unremarkable. Musculoskeletal: There is degenerative change in the thoracic spine. There are no blastic or lytic bone lesions. IMPRESSION: 1. Widespread airspace consolidation bilaterally, more severe on the right than on the left. Multiple nodular opacities raise concern for septic emboli, although currently there is no cavitation in these lesions. There is a minimal left pleural effusion. 2.  No  appreciable adenopathy by size criteria. 3.  Tube positions as described without pneumothorax. 4.  Areas of aortic atherosclerosis. Aortic Atherosclerosis (ICD10-I70.0). Electronically Signed   By: Bretta Bang III M.D.   On:  06/22/2017 14:00   Dg Chest Port 1 View  Result Date: 06/23/2017 CLINICAL DATA:  Initial evaluation for acute respiratory failure with hypoxemia. EXAM: PORTABLE CHEST 1 VIEW COMPARISON:  Prior radiograph from 06/22/2017. FINDINGS: Tracheostomy tube overlies the upper airway. Feeding tube courses in the the abdomen. Right-sided PICC catheter in place with tip overlying the distal SVC. Stable cardiomegaly. Mediastinal silhouette within normal limits. Lungs mildly hypoinflated. Bilateral airspace opacities, right worse than left, overall similar to previous, and concerning for multifocal infection/pneumonia. Superimposed bilateral pleural effusions. Underlying pulmonary vascular congestion without overt pulmonary edema. No pneumothorax. Osseous structures unchanged. IMPRESSION: 1. Support apparatus in satisfactory position as above. 2. No appreciable interval changed in multifocal bilateral airspace opacities, right worse than left, again concerning for infection/pneumonia. 3. Superimposed small bilateral pleural effusions. Electronically Signed   By: Rise Mu M.D.   On: 06/23/2017 06:46   Dg Chest Port 1 View  Result Date: 06/22/2017 CLINICAL DATA:  Pneumonia. EXAM: PORTABLE CHEST 1 VIEW COMPARISON:  06/20/2017. FINDINGS: Tracheostomy tube, feeding tube, right PICC line stable position. Heart size stable. Persistent prominent bilateral airspace disease, right side greater than left again noted without significant change. Right pleural effusion. No pneumothorax. IMPRESSION: 1. Lines and tubes in stable position. 2. Persistent prominent bilateral airspace disease, right side greater than left. Persistent right-sided pleural effusion. No change from prior exam. Electronically Signed   By: Maisie Fus  Register   On: 06/22/2017 06:59    Anti-infectives: Anti-infectives    Start     Dose/Rate Route Frequency Ordered Stop   06/22/17 0600  cefTAZidime (FORTAZ) 1 g in dextrose 5 % 50 mL IVPB  Status:   Discontinued     1 g 100 mL/hr over 30 Minutes Intravenous Every 24 hours 06/21/17 1411 06/21/17 1614   06/21/17 1730  ceFAZolin (ANCEF) IVPB 2g/100 mL premix     2 g 200 mL/hr over 30 Minutes Intravenous Every 12 hours 06/21/17 1632     06/19/17 1750  meropenem (MERREM) 1 g in sodium chloride 0.9 % 100 mL IVPB  Status:  Discontinued     1 g 200 mL/hr over 30 Minutes Intravenous Every 12 hours 06/19/17 0750 06/19/17 1128   06/19/17 1700  cefTAZidime (FORTAZ) 1 g in dextrose 5 % 50 mL IVPB  Status:  Discontinued     1 g 100 mL/hr over 30 Minutes Intravenous Every 12 hours 06/19/17 1246 06/21/17 1411   07/15/2017 1400  meropenem (MERREM) 1 g in sodium chloride 0.9 % 100 mL IVPB  Status:  Discontinued     1 g 200 mL/hr over 30 Minutes Intravenous Every 8 hours 06/29/2017 1252 06/19/17 0750   07/14/2017 1300  clindamycin (CLEOCIN) IVPB 900 mg  Status:  Discontinued     900 mg 100 mL/hr over 30 Minutes Intravenous Every 8 hours 07/02/2017 1255 06/19/17 0840   06/15/2017 1600  clindamycin (CLEOCIN) IVPB 900 mg  Status:  Discontinued     900 mg 100 mL/hr over 30 Minutes Intravenous Every 8 hours 06/15/2017 1505 06/30/2017 1255   06/13/17 1800  Ampicillin-Sulbactam (UNASYN) 3 g in sodium chloride 0.9 % 100 mL IVPB  Status:  Discontinued     3 g 200 mL/hr over 30 Minutes Intravenous Every 6 hours 06/13/17 1555 07/02/2017 1231   06/12/17 1800  vancomycin (VANCOCIN) IVPB 1000 mg/200 mL premix  Status:  Discontinued     1,000 mg 200 mL/hr over 60 Minutes Intravenous Every 8 hours 06/12/17 1244 06/12/2017 0936   06/12/17 1430  clindamycin (CLEOCIN) IVPB 900 mg  Status:  Discontinued     900 mg 100 mL/hr over 30 Minutes Intravenous Every 8 hours 06/12/17 1415 06/05/2017 0936   06/09/17 2200  vancomycin (VANCOCIN) IVPB 750 mg/150 ml premix  Status:  Discontinued     750 mg 150 mL/hr over 60 Minutes Intravenous Every 12 hours 06/09/17 1016 06/12/17 1244   06/09/17 1400  piperacillin-tazobactam (ZOSYN) IVPB 3.375 g   Status:  Discontinued     3.375 g 100 mL/hr over 30 Minutes Intravenous Every 8 hours 06/09/17 1006 06/09/17 1007   06/09/17 1100  piperacillin-tazobactam (ZOSYN) IVPB 3.375 g  Status:  Discontinued     3.375 g 12.5 mL/hr over 240 Minutes Intravenous Every 8 hours 06/09/17 1007 06/13/17 1555   06/09/17 1030  vancomycin (VANCOCIN) 1,500 mg in sodium chloride 0.9 % 500 mL IVPB     1,500 mg 250 mL/hr over 120 Minutes Intravenous  Once 06/09/17 1016 06/09/17 1409   06/10/2017 1800  clindamycin (CLEOCIN) IVPB 600 mg  Status:  Discontinued     600 mg 100 mL/hr over 30 Minutes Intravenous Every 8 hours 05/26/2017 1658 06/09/17 1006   05/29/2017 1730  Ampicillin-Sulbactam (UNASYN) 3 g in sodium chloride 0.9 % 100 mL IVPB  Status:  Discontinued     3 g 200 mL/hr over 30 Minutes Intravenous Every 6 hours 05/29/2017 1658 06/09/17 1006   06/03/2017 0945  clindamycin (CLEOCIN) IVPB 600 mg     600 mg 100 mL/hr over 30 Minutes Intravenous  Once 06/02/2017 0933 06/16/2017 1019      Assessment/Plan: s/p Procedure(s): WOUND EXPLORATION AND DRAINAGE FACE W/ DEBRIDMENT OF NECK (Bilateral) His WBC up slightly. The tissue still has some areas of purulence but does not appear to be in new areas. Continue packing and debridement.   LOS: 19 days    Suzanna Obey 06/23/2017

## 2017-06-23 NOTE — Progress Notes (Signed)
  I have asked micro lab to save isolate for typing and for string test. This maybe example the virulence of the invasive strains of K. pneumoniae is closely associated with a hypermucoviscous phenotype and the K1 genotype. There have been several case reports on K.pneumo causing necrotizing fasciitis in patients of asian decent. Commonly these are community acquired and pan sensitive but these strains are highly resistant to serum killing and phagocytosis.  Despite being pan-sensitive, it was thought that patient on first generation cephalosporins more likely to have metastatic disease, though no consensus on how to treat with broader antibiotics. Improved mortality in the group of patients that had surgical debridement.  The molecular typing of the isolate using multilocus sequence typing reveals the presence of rmpA, a plasmid-mediated regulator in the hypermucoid phenotype, enhances extracapsular polysaccharide synthesis. genotype 23 was the most prevalent sequence type among serotype K1 and carried virulence-associated genes, rmpA, and aerobactin . The virulence gene rmpA has been shown to be a predictor of metastatic infection in patients

## 2017-06-23 NOTE — Progress Notes (Signed)
PULMONARY / CRITICAL CARE MEDICINE   Name: [name] [name] MRN: [id number] DOB: 04/01/1956    ADMISSION DATE:  06/12/2017 CONSULTATION DATE:  06/10/2017  REFERRING MD:  Dr. Thereasa Solo  CHIEF COMPLAINT:  Right parapharyngeal and neck abscesses  HISTORY OF PRESENT ILLNESS:    61 y.o. male with PMH as below, which is significant for DM, HTN, and Hepatitis C. He was in his usual state of health until Friday 6/15 when he noticed some minor R sided facial swelling. This continued to worsen in the following days and he developed associated fevers/chills, dyspnea, and dysphagia. He presented to urgent care 6/17 and was prescribed Clindamycin. He took his first dose, but was unable to take any subsequent doses due to dysphagia. He presented to [facility] ED 6/18 for this complaint. He had CT scan done which demonstrated right parotitis and facial cellulitis. Due to concern for airway compromise, PCCM asked to evaluate for admission. He was found to have parapharyngeal abscess and underwent I&D and trach 6/25.  SUBJECTIVE:  Dressing changed by ENT this morning: still some areas of purulence ntoed Renal function worsening   VITAL SIGNS: BP (!) 104/50   Pulse 78   Temp 100.3 F (37.9 C) (Rectal)   Resp (!) 23   Ht 5' (1.524 m)   Wt 75.1 kg (165 lb 9.1 oz)   SpO2 95%   BMI 32.33 kg/m   HEMODYNAMICS:    VENTILATOR SETTINGS: Vent Mode: PRVC FiO2 (%):  [40 %] 40 % Set Rate:  [20 bmp] 20 bmp Vt Set:  [400 mL] 400 mL PEEP:  [10 cmH20] 10 cmH20 Plateau Pressure:  [24 cmH20] 24 cmH20  INTAKE / OUTPUT: I/O last 3 completed shifts: In: 3184.6 [I.V.:874.6; Blood:335; Other:10; XB/MW:4132; IV Piggyback:300] Out: 4401 [Urine:1745]  PHYSICAL EXAMINATION:   General:  Resting in bed HENT: NCAT Trach in place, facial dressing in place PULM: CTA B, vent supported breathing CV: RRR, no mgr GI: BS+, soft, nontender MSK: normal bulk and tone Neuro: sedated on vent Derm: edema  bilaterally   LABS:  BMET  Recent Labs Lab 06/21/17 0540 06/22/17 0548 06/23/17 0616  NA 143 145 144  K 3.9 3.3* 4.4  CL 106 107 107  CO2 _0 BUN 103* 120* 148*  CREATININE 2.79* 3.25* 4.25*  GLUCOSE 230* 135* 200*    Electrolytes  Recent Labs Lab 06/18/17 1230 06/19/17 0345  06/21/17 0540 06/22/17 0548 06/23/17 0616  CALCIUM  --  6.8*  < > 7.3* 7.6* 7.7*  MG 1.9 2.1  --  2.5*  --   --   PHOS 6.9* 7.4*  --  7.0*  --   --   < > = values in this interval not displayed.  CBC  Recent Labs Lab 06/21/17 1749 06/22/17 0548 06/23/17 0616  WBC 7.9 7.7 12.3*  HGB 6.6* 8.1* 7.7*  HCT 20.5* 25.6* 23.8*  PLT 66* 76* 125*    Coag's No results for input(s): APTT, INR in the last 168 hours.  Sepsis Markers No results for input(s): LATICACIDVEN, PROCALCITON, O2SATVEN in the last 168 hours.  ABG  Recent Labs Lab 07/02/2017 0841 06/18/17 0335 06/18/17 1208  PHART 7.425 7.334* 7.330*  PCO2ART 44.6 46.2 49.5*  PO2ART 384.0* 73.7* 71.0*    Liver Enzymes  Recent Labs Lab 06/18/17 0400 06/20/17 0419 06/21/17 0540 06/22/17 0548  AST 65* 124*  --  120*  ALT 15* 22  --  22  ALKPHOS 107 219*  --  302*  BILITOT 1.5* 3.6*  --  8.0*  ALBUMIN <1.0* <1.0* <1.0* <1.0*    Cardiac Enzymes No results for input(s): TROPONINI, PROBNP in the last 168 hours.  Glucose  Recent Labs Lab 06/22/17 1211 06/22/17 1652 06/22/17 1931 06/22/17 2347 06/23/17 0336 06/23/17 0853  GLUCAP 185* 152* 156* 142* 158* 216*   Imaging   STUDIES:  CT Neck 6/18 > Diffuse facial swelling favored to represent bacterial RIGHT parotitis. Significant facial cellulitis, with RIGHT muscle of mastication enlargement. No visible calculi. No drainable abscess. Minor dental caries and periapical lucencies, not felt to be the source of inflammation. CXR 6/22 > Low lung volumes with right basilar atelectasis. Upper normal heart size likely exaggerated by technique and low lung volumes.  Left lung base and costophrenic angle not included in the field of view. Frontal and lateral views may be helpful if patient is able. CT neck 6/22 > Extensive severe multi compartmental infectious/inflammatory process involving the right face from the right periorbital soft tissues to the upper neck. Inflammation involves the all right-sided deep cervical compartments including right parapharyngeal and retropharyngeal spaces. There multiple ill-defined fluid collections probably representing phlegmon. No discrete rim enhancing abscess is Identified. There is rightward displacement of the airway and moderate narrowing of the airway due to fluid collections in parapharyngeal space and swelling of oral and hypopharyngeal mucosa. The origin of infection is uncertain, possibly odontogenic given the presence of odontogenic disease. CT Neck 6/25 > Continued worsening of inflammatory disease of the right face and deep spaces. Most notable changes include enlargement of the right parapharyngeal abscess now measuring up to 3.5 cm in diameter and extending over a length of almost 6 cm with worsened mass effect upon the hypopharynx and pharynx.  6/27 CxR with ft needing advancement. 7/6 CT chest> dense consolidation in right lung, trace effusion, patchy consolidation left lung  CULTURES: Blood 6/18 > No growth  MRSA PCR 6/18 > Negative  MRSA PCP 6/25 > Negative Staphy aureus 6/25 > Negative  Aerobic/Anareboic Culture 6/25 >> Kleb P. Res to ampicillin  6.28 BC>>>NGTD 7/1 OR>>>klebsiella 7/1 abscess fungus >> Bronch bal 7/2>>>stain gram neg rod>>> Klebsiella 7/6 bronchial aspirate >> 7/6 bronchial aspirate fungus>  7/6 bronchial aspirate afb >   ANTIBIOTICS: Per ID Clindamycin 6/18 > 6/23>>> Unasyn 6/18 > 6/22 Vancomycin 6/23 >>>6/28 unasyn 6/27>>>7/1 Zosyn 6/23 >>>6/27 merom 7/1>7/3 Ceftazidine 7/3 >>>  IVIG 7/1>>> x 1, further held crt rise  SIGNIFICANT EVENTS: 6/18 > Presents to ED 6/25 >  Taken to OR for I&D and Trach  6/29- fevers, vent 6/30- bleeding, blood, cauterized bedside 7/1 fib rvr, borderline BP, to OR  LINES/TUBES: Trach 6/25 >>  L femoral CVC 6/25 >>> 7/6 Left fem cvc 7/1>>> 7/6 picc 6/27>>>  DISCUSSION: 61 year old male presents to ED on 6/18 with right facial cellulitis/parotitis and parapharyngeal abscess s/p I&D and trach on 6/25. Has had multiple debridements since.  Has Klebsiella growing in multiple sources (lungs, facial abscesses).  No known immunocompromised state.  Received 1 dose of IVIg which was associated with ATN and now AKI necessitating hemodialysis.  Remains mechanically ventilated and heavily sedated.  ASSESSMENT / PLAN:  PULMONARY A: Respiratory Insufficieny in setting of facial cellulitis/focal abscess s/p trach  Acute respiratory failure with hypoxemia due to HCAP P:   Continue full vent support VAP prevention Daily WUA/SBT as able Continue high PEEP given severe airspace disease, severe hypoxemia  CARDIOVASCULAR A:  AF RVR> resolved HTN Hypotension> sedation related P:  Tele Continue  amiodarone Neo as needed for MAP > 65  A:   AKI/ATN > worsening Anasarca P:   Place HD cath today Start CVVHD today  GASTROINTESTINAL A:   Hep C genotype 1 with splenomegaly - ? Untreated Protein calorie malnutrition P:   Continue PPI Continue tube feedings  HEMATOLOGIC A:   Anemia> s/p transfusion PRBC 7/5 P:  Monitor for bleeding  INFECTIOUS A:   Right Acute Parotitis with extensive facial cellulitis and focal right parapharyngeal abscess s/p I&D with Klebsiella P. Res to ampicillin - necrotizing Klebsiella PNA  HCAP Fungus on biopsy> just topical candida per ID review of images P:   F/U 7/6 repeat bronch cultures Discussed with ID, will change to ceftazidime  ENDOCRINE A:   DM with current hyperglycemia P:   Continue SSI Add tube feeding coverage  NEUROLOGIC A:   Acute Encephalopathy secondary to  sedation  Pain severe, requiring limited WUA P:   Continue RASS goal to -3 Continue versed gtt/fentanyl gtt PAD protocol  FAMILY  - Inter-disciplinary family meet or Palliative Care meeting due by:  06/18/17 - done DF , ENT  Update: Wife updated 7/6 with translator in detail by [place]  My cc time 35 minutes  Roselie Awkward, MD [facility] Pager: [phone] Cell: [phone] After 3pm or if no response, call [phone]   06/23/2017 10:31 AM

## 2017-06-23 NOTE — Progress Notes (Signed)
[facility] Progress Note    Assessment/ Plan:   1. Acute Kidney injury secondary to contrast (administered 6/18, 6/22, 6/25 and 6/28) + IVIG (7/1) and currently with contrast + osmotic tubular damage leading to ATN. Through the translator I explained to the spouse that currently he doesn't have any absolute indications for dialysis but if his renal function doesn't improve in the next few days we have to initiate dialysis especially if his UOP starts to drop off. He may be in for a more prolonged course given the history and I would not be surprised if we have to initiate RRT in the next 24-48 hrs.  - UOP just under 1L overnight but not able to keep up.  - Renal function also worse   --> very little renal function and UOP now under 1L w/ still net positive overnight again.  --> Likely to be a prolonged course of renal failure; will start CVVHD 2. Parotitis + abscesses w/ Klebsiella. 3. Afib RVR but now sinus 4. HCV - has received tx in the past per spouse. 5. Anemia - tx 7/5 6. DM  Subjective:   Unable to obtain ROS, pt w/ trach and Neo started this AM. Wife is bedside. Waiting for translator.   Objective:   BP (!) 108/55   Pulse 79   Temp 100.3 F (37.9 C) (Rectal)   Resp (!) 21   Ht 5' (1.524 m)   Wt 75.1 kg (165 lb 9.1 oz)   SpO2 95%   BMI 32.33 kg/m   Intake/Output Summary (Last 24 hours) at 06/23/17 0739 Last data filed at 06/23/17 0600  Gross per 24 hour  Intake          1615.12 ml  Output              995 ml  Net           620.12 ml   Weight change: -3.3 kg (-7 lb 4.4 oz)  Physical Exam: Gen: Trached, right side of eye/head wrapped, dressing in place on left neck as well CVS: No rub, NSR Resp: diffuse rhonchi b/l SAY:[id number] Ext:2+ edema diffusely  Imaging: Ct Chest Wo Contrast  Result Date: 06/22/2017 CLINICAL DATA:  Pneumonia EXAM: CT CHEST WITHOUT CONTRAST TECHNIQUE: Multidetector CT imaging of the chest was performed following the  standard protocol without IV contrast. COMPARISON:  Chest radiographs June 22, 2017, June 20, 2017, and June 11, 2017 FINDINGS: Cardiovascular: There is no appreciable thoracic aortic aneurysm. Visualized great vessels appear normal. There is atherosclerotic calcification in the aorta. Pericardium is not appreciably thickened. Mediastinum/Nodes: Visualized thyroid appears unremarkable. There are subcentimeter mediastinal lymph nodes. No frank adenopathy is appreciable by size criteria. Nasogastric tube extends through the esophagus with tip in the stomach. Lungs/Pleura: There is extensive airspace consolidation throughout the entire right lower lobe and much of the right middle lobe. A portion of the more medial and anterior aspects of the right middle lobe are aerated. There is consolidation in portions of the right upper lobe posteriorly. On the left, there is consolidation throughout much of the left lower lobe as well as in the inferior lingula and posterior segments of the left upper lobe. There is a minimal pleural effusion on the left. There are scattered nodular areas in the right upper lobe, right middle lobe, and left lower lobe without appreciable cavitation. The largest of these nodular lesions measures slightly greater than 1 cm. Tracheostomy catheter tip is in the distal trachea. No pneumothorax. Upper  Abdomen: Despite the presence of nasogastric tube, stomach is distended with fluid and air. Visualized upper abdominal structures otherwise appear unremarkable. Musculoskeletal: There is degenerative change in the thoracic spine. There are no blastic or lytic bone lesions. IMPRESSION: 1. Widespread airspace consolidation bilaterally, more severe on the right than on the left. Multiple nodular opacities raise concern for septic emboli, although currently there is no cavitation in these lesions. There is a minimal left pleural effusion. 2.  No appreciable adenopathy by size criteria. 3.  Tube positions as  described without pneumothorax. 4.  Areas of aortic atherosclerosis. Aortic Atherosclerosis (ICD10-I70.0). Electronically Signed   By: Lowella Grip III M.D.   On: 06/22/2017 14:00   Dg Chest Port 1 View  Result Date: 06/23/2017 CLINICAL DATA:  Initial evaluation for acute respiratory failure with hypoxemia. EXAM: PORTABLE CHEST 1 VIEW COMPARISON:  Prior radiograph from 06/22/2017. FINDINGS: Tracheostomy tube overlies the upper airway. Feeding tube courses in the the abdomen. Right-sided PICC catheter in place with tip overlying the distal SVC. Stable cardiomegaly. Mediastinal silhouette within normal limits. Lungs mildly hypoinflated. Bilateral airspace opacities, right worse than left, overall similar to previous, and concerning for multifocal infection/pneumonia. Superimposed bilateral pleural effusions. Underlying pulmonary vascular congestion without overt pulmonary edema. No pneumothorax. Osseous structures unchanged. IMPRESSION: 1. Support apparatus in satisfactory position as above. 2. No appreciable interval changed in multifocal bilateral airspace opacities, right worse than left, again concerning for infection/pneumonia. 3. Superimposed small bilateral pleural effusions. Electronically Signed   By: Jeannine Boga M.D.   On: 06/23/2017 06:46   Dg Chest Port 1 View  Result Date: 06/22/2017 CLINICAL DATA:  Pneumonia. EXAM: PORTABLE CHEST 1 VIEW COMPARISON:  06/20/2017. FINDINGS: Tracheostomy tube, feeding tube, right PICC line stable position. Heart size stable. Persistent prominent bilateral airspace disease, right side greater than left again noted without significant change. Right pleural effusion. No pneumothorax. IMPRESSION: 1. Lines and tubes in stable position. 2. Persistent prominent bilateral airspace disease, right side greater than left. Persistent right-sided pleural effusion. No change from prior exam. Electronically Signed   By: Marcello Moores  Register   On: 06/22/2017 06:59     Labs: BMET  Recent Labs Lab 06/22/2017 0424  06/22/2017 1010 06/18/17 0400 06/18/17 1230 06/19/17 0345 06/20/17 0419 06/21/17 0540 06/22/17 0548 06/23/17 0616  NA 142  < > 147* 136  --  140 141 143 145 144  K 3.9  < > 4.3 4.1 4.0 4.0 3.3* 3.9 3.3* 4.4  CL 109  --   --  107  --  104 104 106 107 107  CO2 26  --   --  25  --  25 26 27 26 25   GLUCOSE 199*  < > 137* 115*  --  151* 218* 230* 135* 200*  BUN 19  --   --  30*  --  46* 66* 103* 120* 148*  CREATININE 0.96  --   --  1.30*  --  1.57* 1.83* 2.79* 3.25* 4.25*  CALCIUM 7.1*  --   --  6.5*  --  6.8* 7.0* 7.3* 7.6* 7.7*  PHOS  --   --   --   --  6.9* 7.4*  --  7.0*  --   --   < > = values in this interval not displayed. CBC  Recent Labs Lab 06/18/17 0400  06/20/17 0419 06/21/17 0540 06/21/17 1749 06/22/17 0548 06/23/17 0616  WBC 11.9*  < > 12.8* 8.6 7.9 7.7 12.3*  NEUTROABS 10.7*  --  11.6*  7.9*  --   --  11.0*  HGB 10.2*  < > 7.8* 7.0* 6.6* 8.1* 7.7*  HCT 31.0*  < > 23.8* 22.0* 20.5* 25.6* 23.8*  MCV 83.3  < > 85.3 87.0 85.4 85.3 85.6  PLT 77*  < > 83* 68* 66* 76* 125*  < > = values in this interval not displayed.  Medications:    . amiodarone  400 mg Per Tube BID  . chlorhexidine gluconate (MEDLINE KIT)  15 mL Mouth Rinse BID  . Chlorhexidine Gluconate Cloth  6 each Topical Daily  . feeding supplement (PRO-STAT SUGAR FREE 64)  30 mL Per Tube TID  . heparin subcutaneous  5,000 Units Subcutaneous Q8H  . insulin aspart  2-6 Units Subcutaneous Q4H  . labetalol  10 mg Intravenous Once  . mouth rinse  15 mL Mouth Rinse 10 times per day  . nystatin   Topical BID  . pantoprazole sodium  40 mg Per Tube Daily  . sodium chloride flush  10-40 mL Intracatheter Q12H      Otelia Santee, MD 06/23/2017, 7:39 AM

## 2017-06-23 NOTE — Progress Notes (Signed)
Pharmacy Antibiotic Note [name] [name] is a 61 y.o. male admitted on 03/15/17 with Klebsiella necrotizing head and neck abscess, severe Klebsiella PNA. While the Baird Kayklesbiella is highly sensitive, his pneumonia has been worsening despite being on appropriate antibiotics. He has undergone multiple debridements of the the face and neck abscess. He also received one dose of IVIG in an attempt to reduce the inflammation from the necrotizing infection - this was unsuccessful and has been discotninued. CCM team looked at case reports today of necrotizing, hypervirulent klebsiella pneumoniae - [facility] [place] [place] not much information from [place] All reports treated the infection differently, planning to add another antibiotic today given lack of clinical improvement. Patient is also beginning CRRT today.   Plan: -Ceftazidime 2 g IV q12h -Cipro 400 mg IV q12h -Monitor CRRT -Monitor cultures, re-bronched on 7/6, new blood cultures drawn 7/7   Height: 5' (152.4 cm) Weight: 165 lb 9.1 oz (75.1 kg) IBW/kg (Calculated) : 50  Temp (24hrs), Avg:101.1 F (38.4 C), Min:98.9 F (37.2 C), Max:103.2 F (39.6 C)   Recent Labs Lab 06/19/17 0345 06/20/17 0419 06/21/17 0540 06/21/17 1749 06/22/17 0548 06/23/17 0616  WBC 18.0* 12.8* 8.6 7.9 7.7 12.3*  CREATININE 1.57* 1.83* 2.79*  --  3.25* 4.25*      [name] [name], PharmD, BCPS Clinical Pharmacist 06/23/2017 12:35 PM

## 2017-06-23 NOTE — Progress Notes (Signed)
Scheduled 2000 CPT not done due to pt receiving bath. Will resume at next scheduled time. RT will continue to monitor.

## 2017-06-23 NOTE — Progress Notes (Signed)
CPT not done at this time. Pt being prepared for a sterile procedure.

## 2017-06-23 NOTE — Procedures (Signed)
Hemodialyis Catheter Insertion Procedure Note [name] [name] [id number] [birth year]  Procedure: Insertion of Central Venous Catheter Indications: Need for hemodialysis  Procedure Details Consent: Risks of procedure as well as the alternatives and risks of each were explained to the (patient/caregiver).  Consent for procedure obtained. Time Out: Verified patient identification, verified procedure, site/side was marked, verified correct patient position, special equipment/implants available, medications/allergies/relevent history reviewed, required imaging and test results available.  Performed  Maximum sterile technique was used including antiseptics, cap, gloves, gown, hand hygiene, mask and sheet. Skin prep: Chlorhexidine; local anesthetic administered A antimicrobial bonded/coated triple lumen catheter was placed in the right femoral vein due to severe purulent infection of the head and neck.   Ultrasound was used to verify the patency of the vein and for real time needle guidance.  Evaluation Blood flow good Complications: No apparent complications Patient did tolerate procedure well. Chest X-ray ordered to verify placement.  CXR: pending.  [name] FickleDouglas [name] 06/23/2017, 12:16 PM

## 2017-06-24 ENCOUNTER — Inpatient Hospital Stay (HOSPITAL_COMMUNITY): Payer: 59

## 2017-06-24 LAB — BLOOD GAS, ARTERIAL
Acid-base deficit: 6.7 mmol/L — ABNORMAL HIGH (ref 0.0–2.0)
BICARBONATE: 20.1 mmol/L (ref 20.0–28.0)
Drawn by: 441371
FIO2: 40
LHR: 20 {breaths}/min
O2 SAT: 95.3 %
PATIENT TEMPERATURE: 100.1
PCO2 ART: 55.9 mmHg — AB (ref 32.0–48.0)
PEEP: 10 cmH2O
PH ART: 7.187 — AB (ref 7.350–7.450)
PO2 ART: 89.9 mmHg (ref 83.0–108.0)
VT: 400 mL

## 2017-06-24 LAB — RENAL FUNCTION PANEL
ANION GAP: 9 (ref 5–15)
Anion gap: 8 (ref 5–15)
BUN: 82 mg/dL — AB (ref 6–20)
BUN: 50 mg/dL — AB (ref 6–20)
CALCIUM: 7.4 mg/dL — AB (ref 8.9–10.3)
CALCIUM: 7.4 mg/dL — AB (ref 8.9–10.3)
CO2: 25 mmol/L (ref 22–32)
CO2: 22 mmol/L (ref 22–32)
CREATININE: 2.58 mg/dL — AB (ref 0.61–1.24)
Chloride: 106 mmol/L (ref 101–111)
Chloride: 105 mmol/L (ref 101–111)
Creatinine, Ser: 2 mg/dL — ABNORMAL HIGH (ref 0.61–1.24)
GFR calc Af Amer: 30 mL/min — ABNORMAL LOW (ref >60)
GFR calc Af Amer: 40 mL/min — ABNORMAL LOW (ref >60)
GFR calc non Af Amer: 35 mL/min — ABNORMAL LOW (ref >60)
GFR, EST NON AFRICAN AMERICAN: 26 mL/min — AB (ref >60)
GLUCOSE: 126 mg/dL — AB (ref 65–99)
GLUCOSE: 134 mg/dL — AB (ref 65–99)
PHOSPHORUS: 4.8 mg/dL — AB (ref 2.5–4.6)
PHOSPHORUS: 4.7 mg/dL — AB (ref 2.5–4.6)
Potassium: 4.2 mmol/L (ref 3.5–5.1)
Potassium: 5.1 mmol/L (ref 3.5–5.1)
SODIUM: 139 mmol/L (ref 135–145)
SODIUM: 136 mmol/L (ref 135–145)

## 2017-06-24 LAB — GLUCOSE, CAPILLARY
GLUCOSE-CAPILLARY: 121 mg/dL — AB (ref 65–99)
GLUCOSE-CAPILLARY: 100 mg/dL — AB (ref 65–99)
GLUCOSE-CAPILLARY: 123 mg/dL — AB (ref 65–99)
GLUCOSE-CAPILLARY: 105 mg/dL — AB (ref 65–99)
Glucose-Capillary: 118 mg/dL — ABNORMAL HIGH (ref 65–99)
Glucose-Capillary: 124 mg/dL — ABNORMAL HIGH (ref 65–99)
Glucose-Capillary: 97 mg/dL (ref 65–99)

## 2017-06-24 LAB — CBC WITH DIFFERENTIAL/PLATELET
BASOS ABS: 0 10*3/uL (ref 0.0–0.1)
BASOS PCT: 0 %
EOS ABS: 0 10*3/uL (ref 0.0–0.7)
Eosinophils Relative: 0 %
HCT: 22.4 % — ABNORMAL LOW (ref 39.0–52.0)
Hemoglobin: 7.1 g/dL — ABNORMAL LOW (ref 13.0–17.0)
LYMPHS ABS: 1 10*3/uL (ref 0.7–4.0)
Lymphocytes Relative: 6 %
MCH: 27.5 pg (ref 26.0–34.0)
MCHC: 31.7 g/dL (ref 30.0–36.0)
MCV: 86.8 fL (ref 78.0–100.0)
MONO ABS: 0.8 10*3/uL (ref 0.1–1.0)
Monocytes Relative: 5 %
NEUTROS ABS: 14.4 10*3/uL — AB (ref 1.7–7.7)
Neutrophils Relative %: 89 %
PLATELETS: 142 10*3/uL — AB (ref 150–400)
RBC: 2.58 MIL/uL — ABNORMAL LOW (ref 4.22–5.81)
RDW: 17.8 % — AB (ref 11.5–15.5)
WBC: 16.2 10*3/uL — ABNORMAL HIGH (ref 4.0–10.5)

## 2017-06-24 LAB — AEROBIC/ANAEROBIC CULTURE (SURGICAL/DEEP WOUND)

## 2017-06-24 LAB — URINE CULTURE: Culture: >=100000 — AB

## 2017-06-24 LAB — AEROBIC/ANAEROBIC CULTURE W GRAM STAIN (SURGICAL/DEEP WOUND)

## 2017-06-24 LAB — CULTURE, RESPIRATORY

## 2017-06-24 LAB — TROPONIN I
TROPONIN I: 0.08 ng/mL — AB (ref <0.03)
Troponin I: 0.2 ng/mL (ref <0.03)

## 2017-06-24 LAB — CULTURE, RESPIRATORY W GRAM STAIN

## 2017-06-24 LAB — MAGNESIUM: MAGNESIUM: 2.3 mg/dL (ref 1.7–2.4)

## 2017-06-24 MED ORDER — MIDAZOLAM HCL 2 MG/2ML IJ SOLN: 1.0000 mg | INTRAMUSCULAR | Status: DC | PRN

## 2017-06-24 MED ORDER — ASPIRIN 81 MG PO CHEW
81.0000 mg | CHEWABLE_TABLET | Freq: Every day | ORAL | Status: DC
  Administered 2017-06-24: 81 mg
  Filled 2017-06-24: qty 1

## 2017-06-24 MED ORDER — ARTIFICIAL TEARS OPHTHALMIC OINT
TOPICAL_OINTMENT | OPHTHALMIC | Status: DC | PRN
  Administered 2017-06-24: 1 via OPHTHALMIC
  Filled 2017-06-24: qty 3.5

## 2017-06-24 MED ORDER — HEPARIN (PORCINE) IN NACL 100-0.45 UNIT/ML-% IJ SOLN
1000.0000 [IU]/h | INTRAMUSCULAR | Status: DC
  Administered 2017-06-24: 800 [IU]/h via INTRAVENOUS
  Filled 2017-06-24 (×2): qty 250

## 2017-06-24 MED ORDER — NOREPINEPHRINE BITARTRATE 1 MG/ML IV SOLN
0.0000 ug/min | INTRAVENOUS | Status: DC
  Administered 2017-06-24: 2 ug/min via INTRAVENOUS
  Administered 2017-06-24: 40 ug/min via INTRAVENOUS
  Filled 2017-06-24 (×2): qty 4

## 2017-06-24 MED ORDER — SODIUM CHLORIDE 0.9 % IV SOLN
0.0000 ug/min | INTRAVENOUS | Status: DC
  Administered 2017-06-24 – 2017-06-25 (×4): 400 ug/min via INTRAVENOUS
  Filled 2017-06-24 (×5): qty 8

## 2017-06-24 NOTE — Progress Notes (Signed)
[facility] Progress Note    Assessment/ Plan:   1. Acute Kidney injury secondary to contrast (administered 6/18, 6/22, 6/25 and 6/28) + IVIG (7/1) and currently with contrast + osmotic tubular damage leading to ATN. Through the translator I explained to the spouse that currently he doesn't have any absolute indications for dialysis but if his renal function doesn't improve in the next few days we have to initiate dialysis especially if his UOP starts to drop off. He may be in for a more prolonged course given the history and I would not be surprised if we have to initiate RRT in the next 24-48 hrs.  - UOP just under 1L overnight but not able to keep up.  - Renal function also worse              --> very little renal function and UOP decreasing which is not unusual on RRT.             --> Likely to be a prolonged course of renal failure;  started CVVHD on 7/7  --> Was still net positive overnight as CVVHD not started till ~1PM.   Seen on CVVHD at ~720AM  No clotting overnight w/ RRT thru rt fem cath  Continue 4K bath  500/300/1500  UF - net 78m/hr (will try for 866mhr today as tolerated)  I&O [id number] +608 /24hr  2. Parotitis + abscesses w/ Klebsiella. 3. Afib RVR but now sinus 4. HCV - has received tx in the past per spouse. 5. Anemia - tx 7/5 6. DM  Subjective:   Unable to obtain ROS, pt w/ trach and Neo started this AM. Wife is bedside.  Bradycardic but tolerating CVVHD.    Objective:   BP (!) 96/50   Pulse (!) 57   Temp (!) 94 F (34.4 C) (Rectal)   Resp 19   Ht 5' (1.524 m)   Wt 74.9 kg (165 lb 2 oz)   SpO2 98%   BMI 32.25 kg/m   Intake/Output Summary (Last 24 hours) at 06/24/17 0756 Last data filed at 06/24/17 0700  Gross per 24 hour  Intake          4046.01 ml  Output             3438 ml  Net           608.01 ml   Weight change: -0.2 kg (-7.1 oz)  Physical Exam: Gen: Trached, right side of eye/head wrapped, dressing in place on left  neck as well CVS: No rub, NSR Resp: diffuse rhonchi b/l AbVQM:GQQPYPxt:2+edema diffusely GU: Scrotal edema  Imaging: Ct Chest Wo Contrast  Result Date: 06/22/2017 CLINICAL DATA:  Pneumonia EXAM: CT CHEST WITHOUT CONTRAST TECHNIQUE: Multidetector CT imaging of the chest was performed following the standard protocol without IV contrast. COMPARISON:  Chest radiographs June 22, 2017, June 20, 2017, and June 11, 2017 FINDINGS: Cardiovascular: There is no appreciable thoracic aortic aneurysm. Visualized great vessels appear normal. There is atherosclerotic calcification in the aorta. Pericardium is not appreciably thickened. Mediastinum/Nodes: Visualized thyroid appears unremarkable. There are subcentimeter mediastinal lymph nodes. No frank adenopathy is appreciable by size criteria. Nasogastric tube extends through the esophagus with tip in the stomach. Lungs/Pleura: There is extensive airspace consolidation throughout the entire right lower lobe and much of the right middle lobe. A portion of the more medial and anterior aspects of the right middle lobe are aerated. There is consolidation in portions of the right upper lobe posteriorly.  On the left, there is consolidation throughout much of the left lower lobe as well as in the inferior lingula and posterior segments of the left upper lobe. There is a minimal pleural effusion on the left. There are scattered nodular areas in the right upper lobe, right middle lobe, and left lower lobe without appreciable cavitation. The largest of these nodular lesions measures slightly greater than 1 cm. Tracheostomy catheter tip is in the distal trachea. No pneumothorax. Upper Abdomen: Despite the presence of nasogastric tube, stomach is distended with fluid and air. Visualized upper abdominal structures otherwise appear unremarkable. Musculoskeletal: There is degenerative change in the thoracic spine. There are no blastic or lytic bone lesions. IMPRESSION: 1. Widespread  airspace consolidation bilaterally, more severe on the right than on the left. Multiple nodular opacities raise concern for septic emboli, although currently there is no cavitation in these lesions. There is a minimal left pleural effusion. 2.  No appreciable adenopathy by size criteria. 3.  Tube positions as described without pneumothorax. 4.  Areas of aortic atherosclerosis. Aortic Atherosclerosis (ICD10-I70.0). Electronically Signed   By: Lowella Grip III M.D.   On: 06/22/2017 14:00   Dg Chest Port 1 View  Result Date: 06/24/2017 CLINICAL DATA:  Acute respiratory failure EXAM: PORTABLE CHEST 1 VIEW COMPARISON:  June 23, 2017 FINDINGS: The feeding tube terminates below today's film. The right PICC line and tracheostomy tube are in stable position. No pneumothorax. Infiltrate diffusely throughout the right lung, most prominent in the lower lung is stable. Mild opacity and probable tiny effusion on the left is stable. No other interval changes. IMPRESSION: Stable support apparatus. Bilateral pulmonary infiltrates, right greater than left, are also stable. Electronically Signed   By: Dorise Bullion III M.D   On: 06/24/2017 06:43   Dg Chest Port 1 View  Result Date: 06/23/2017 CLINICAL DATA:  Initial evaluation for acute respiratory failure with hypoxemia. EXAM: PORTABLE CHEST 1 VIEW COMPARISON:  Prior radiograph from 06/22/2017. FINDINGS: Tracheostomy tube overlies the upper airway. Feeding tube courses in the the abdomen. Right-sided PICC catheter in place with tip overlying the distal SVC. Stable cardiomegaly. Mediastinal silhouette within normal limits. Lungs mildly hypoinflated. Bilateral airspace opacities, right worse than left, overall similar to previous, and concerning for multifocal infection/pneumonia. Superimposed bilateral pleural effusions. Underlying pulmonary vascular congestion without overt pulmonary edema. No pneumothorax. Osseous structures unchanged. IMPRESSION: 1. Support apparatus in  satisfactory position as above. 2. No appreciable interval changed in multifocal bilateral airspace opacities, right worse than left, again concerning for infection/pneumonia. 3. Superimposed small bilateral pleural effusions. Electronically Signed   By: Jeannine Boga M.D.   On: 06/23/2017 06:46    Labs: BMET  Recent Labs Lab 06/18/17 1230 06/19/17 0345 06/20/17 0419 06/21/17 0540 06/22/17 0548 06/23/17 0616 06/23/17 1645 06/24/17 0345  NA  --  140 141 143 145 144 141 139  K 4.0 4.0 3.3* 3.9 3.3* 4.4 4.5 4.2  CL  --  104 104 106 107 107 106 106  CO2  --  25 26 27 26 25 25 25   GLUCOSE  --  151* 218* 230* 135* 200* 205* 126*  BUN  --  46* 66* 103* 120* 148* 125* 82*  CREATININE  --  1.57* 1.83* 2.79* 3.25* 4.25* 3.77* 2.58*  CALCIUM  --  6.8* 7.0* 7.3* 7.6* 7.7* 7.3* 7.4*  PHOS 6.9* 7.4*  --  7.0*  --   --  6.8* 4.8*   CBC  Recent Labs Lab 06/20/17 0419 06/21/17 0540 06/21/17  1749 06/22/17 0548 06/23/17 0616 06/24/17 0345  WBC 12.8* 8.6 7.9 7.7 12.3* 16.2*  NEUTROABS 11.6* 7.9*  --   --  11.0* 14.4*  HGB 7.8* 7.0* 6.6* 8.1* 7.7* 7.1*  HCT 23.8* 22.0* 20.5* 25.6* 23.8* 22.4*  MCV 85.3 87.0 85.4 85.3 85.6 86.8  PLT 83* 68* 66* 76* 125* 142*    Medications:    . amiodarone  400 mg Per Tube BID  . chlorhexidine gluconate (MEDLINE KIT)  15 mL Mouth Rinse BID  . Chlorhexidine Gluconate Cloth  6 each Topical Daily  . feeding supplement (PRO-STAT SUGAR FREE 64)  30 mL Per Tube TID  . heparin subcutaneous  5,000 Units Subcutaneous Q8H  . insulin aspart  2-11 Units Subcutaneous Q4H  . labetalol  10 mg Intravenous Once  . mouth rinse  15 mL Mouth Rinse 10 times per day  . nystatin   Topical BID  . pantoprazole sodium  40 mg Per Tube Daily  . sodium chloride flush  10-40 mL Intracatheter Q12H      Otelia Santee, MD 06/24/2017, 7:56 AM

## 2017-06-24 NOTE — Progress Notes (Signed)
Heparin drip started, Critical troponin of 0.20 was called to [facility]. Dr. Donette LarrySomer is aware. See new orders

## 2017-06-24 NOTE — Progress Notes (Addendum)
Irregular rhythm noted. [facility] called again. EKG was done infront of Dr. Arsenio LoaderSommer. Possible STEMI identified. See new orders (Troponin collected, Aspirin given) Waiting on pharmacy

## 2017-06-24 NOTE — Progress Notes (Signed)
Critical ABG results pH 7.187 pCO2 55.9 pO2 89.9 bicarb 20.[facility] and relayed to Dr Arsenio LoaderSommer eMD.

## 2017-06-24 NOTE — Progress Notes (Signed)
7 Days Post-Op   Subjective/Chief Complaint: He is stable   Objective: Vital signs in last 24 hours: Temp:  [94 F (34.4 C)-97.4 F (36.3 C)] 94 F (34.4 C) (07/08 0700) Pulse Rate:  [49-79] 57 (07/08 0830) Resp:  [10-61] 19 (07/08 0830) BP: (84-114)/(41-64) 99/53 (07/08 0830) SpO2:  [84 %-100 %] 99 % (07/08 0830) FiO2 (%):  [40 %] 40 % (07/08 0740) Weight:  [74.9 kg (165 lb 2 oz)] 74.9 kg (165 lb 2 oz) (07/08 0500) Last BM Date: 06/24/17  Intake/Output from previous day: 07/07 0701 - 07/08 0700 In: 4046 [I.V.:2126; NG/GT:1320; IV Piggyback:600] Out: [id number] [Urine:40] Intake/Output this shift: Total I/O In: 175.3 [I.V.:85.3; NG/GT:90] Out: 142 [Other:142]  the wounds were debrided again and the areas all seem to bleed. packing was placed back into all areas.   Lab Results:   Recent Labs  06/23/17 0616 06/24/17 0345  WBC 12.3* 16.2*  HGB 7.7* 7.1*  HCT 23.8* 22.4*  PLT 125* 142*   BMET  Recent Labs  06/23/17 1645 06/24/17 0345  NA 141 139  K 4.5 4.2  CL 106 106  CO2 25 25  GLUCOSE 205* 126*  BUN 125* 82*  CREATININE 3.77* 2.58*  CALCIUM 7.3* 7.4*   PT/INR No results for input(s): LABPROT, INR in the last 72 hours. ABG No results for input(s): PHART, HCO3 in the last 72 hours.  Invalid input(s): PCO2, PO2  Studies/Results: Ct Chest Wo Contrast  Result Date: 06/22/2017 CLINICAL DATA:  Pneumonia EXAM: CT CHEST WITHOUT CONTRAST TECHNIQUE: Multidetector CT imaging of the chest was performed following the standard protocol without IV contrast. COMPARISON:  Chest radiographs June 22, 2017, June 20, 2017, and June 11, 2017 FINDINGS: Cardiovascular: There is no appreciable thoracic aortic aneurysm. Visualized great vessels appear normal. There is atherosclerotic calcification in the aorta. Pericardium is not appreciably thickened. Mediastinum/Nodes: Visualized thyroid appears unremarkable. There are subcentimeter mediastinal lymph nodes. No frank adenopathy is  appreciable by size criteria. Nasogastric tube extends through the esophagus with tip in the stomach. Lungs/Pleura: There is extensive airspace consolidation throughout the entire right lower lobe and much of the right middle lobe. A portion of the more medial and anterior aspects of the right middle lobe are aerated. There is consolidation in portions of the right upper lobe posteriorly. On the left, there is consolidation throughout much of the left lower lobe as well as in the inferior lingula and posterior segments of the left upper lobe. There is a minimal pleural effusion on the left. There are scattered nodular areas in the right upper lobe, right middle lobe, and left lower lobe without appreciable cavitation. The largest of these nodular lesions measures slightly greater than 1 cm. Tracheostomy catheter tip is in the distal trachea. No pneumothorax. Upper Abdomen: Despite the presence of nasogastric tube, stomach is distended with fluid and air. Visualized upper abdominal structures otherwise appear unremarkable. Musculoskeletal: There is degenerative change in the thoracic spine. There are no blastic or lytic bone lesions. IMPRESSION: 1. Widespread airspace consolidation bilaterally, more severe on the right than on the left. Multiple nodular opacities raise concern for septic emboli, although currently there is no cavitation in these lesions. There is a minimal left pleural effusion. 2.  No appreciable adenopathy by size criteria. 3.  Tube positions as described without pneumothorax. 4.  Areas of aortic atherosclerosis. Aortic Atherosclerosis (ICD10-I70.0). Electronically Signed   By: Bretta BangWilliam  Woodruff III M.D.   On: 06/22/2017 14:00   [facility]  Result Date: 06/24/2017 CLINICAL DATA:  Acute respiratory failure EXAM: PORTABLE CHEST 1 VIEW COMPARISON:  June 23, 2017 FINDINGS: The feeding tube terminates below today's film. The right PICC line and tracheostomy tube are in stable position. No  pneumothorax. Infiltrate diffusely throughout the right lung, most prominent in the lower lung is stable. Mild opacity and probable tiny effusion on the left is stable. No other interval changes. IMPRESSION: Stable support apparatus. Bilateral pulmonary infiltrates, right greater than left, are also stable. Electronically Signed   By: Gerome Sam III M.D   On: 06/24/2017 06:43   Dg Chest Port 1 View  Result Date: 06/23/2017 CLINICAL DATA:  Initial evaluation for acute respiratory failure with hypoxemia. EXAM: PORTABLE CHEST 1 VIEW COMPARISON:  Prior radiograph from 06/22/2017. FINDINGS: Tracheostomy tube overlies the upper airway. Feeding tube courses in the the abdomen. Right-sided PICC catheter in place with tip overlying the distal SVC. Stable cardiomegaly. Mediastinal silhouette within normal limits. Lungs mildly hypoinflated. Bilateral airspace opacities, right worse than left, overall similar to previous, and concerning for multifocal infection/pneumonia. Superimposed bilateral pleural effusions. Underlying pulmonary vascular congestion without overt pulmonary edema. No pneumothorax. Osseous structures unchanged. IMPRESSION: 1. Support apparatus in satisfactory position as above. 2. No appreciable interval changed in multifocal bilateral airspace opacities, right worse than left, again concerning for infection/pneumonia. 3. Superimposed small bilateral pleural effusions. Electronically Signed   By: Rise Mu M.D.   On: 06/23/2017 06:46    Anti-infectives: Anti-infectives    Start     Dose/Rate Route Frequency Ordered Stop   06/23/17 1400  cefTAZidime (FORTAZ) 2 g in dextrose 5 % 50 mL IVPB     2 g 100 mL/hr over 30 Minutes Intravenous Every 12 hours 06/23/17 1155     06/23/17 1300  ciprofloxacin (CIPRO) IVPB 400 mg     400 mg 200 mL/hr over 60 Minutes Intravenous Every 12 hours 06/23/17 1155     06/22/17 0600  cefTAZidime (FORTAZ) 1 g in dextrose 5 % 50 mL IVPB  Status:   Discontinued     1 g 100 mL/hr over 30 Minutes Intravenous Every 24 hours 06/21/17 1411 06/21/17 1614   06/21/17 1730  ceFAZolin (ANCEF) IVPB 2g/100 mL premix  Status:  Discontinued     2 g 200 mL/hr over 30 Minutes Intravenous Every 12 hours 06/21/17 1632 06/23/17 1046   06/19/17 1750  meropenem (MERREM) 1 g in sodium chloride 0.9 % 100 mL IVPB  Status:  Discontinued     1 g 200 mL/hr over 30 Minutes Intravenous Every 12 hours 06/19/17 0750 06/19/17 1128   06/19/17 1700  cefTAZidime (FORTAZ) 1 g in dextrose 5 % 50 mL IVPB  Status:  Discontinued     1 g 100 mL/hr over 30 Minutes Intravenous Every 12 hours 06/19/17 1246 06/21/17 1411   July 10, 2017 1400  meropenem (MERREM) 1 g in sodium chloride 0.9 % 100 mL IVPB  Status:  Discontinued     1 g 200 mL/hr over 30 Minutes Intravenous Every 8 hours 07-10-17 1252 06/19/17 0750   Jul 10, 2017 1300  clindamycin (CLEOCIN) IVPB 900 mg  Status:  Discontinued     900 mg 100 mL/hr over 30 Minutes Intravenous Every 8 hours 2017-07-10 1255 06/19/17 0840   05/28/2017 1600  clindamycin (CLEOCIN) IVPB 900 mg  Status:  Discontinued     900 mg 100 mL/hr over 30 Minutes Intravenous Every 8 hours 05/28/2017 1505 07/10/17 1255   06/13/17 1800  Ampicillin-Sulbactam (UNASYN) 3 g in sodium chloride  0.9 % 100 mL IVPB  Status:  Discontinued     3 g 200 mL/hr over 30 Minutes Intravenous Every 6 hours 06/13/17 1555 07/08/2017 1231   06/12/17 1800  vancomycin (VANCOCIN) IVPB 1000 mg/200 mL premix  Status:  Discontinued     1,000 mg 200 mL/hr over 60 Minutes Intravenous Every 8 hours 06/12/17 1244 06/03/2017 0936   06/12/17 1430  clindamycin (CLEOCIN) IVPB 900 mg  Status:  Discontinued     900 mg 100 mL/hr over 30 Minutes Intravenous Every 8 hours 06/12/17 1415 05/26/2017 0936   06/09/17 2200  vancomycin (VANCOCIN) IVPB 750 mg/150 ml premix  Status:  Discontinued     750 mg 150 mL/hr over 60 Minutes Intravenous Every 12 hours 06/09/17 1016 06/12/17 1244   06/09/17 1400   piperacillin-tazobactam (ZOSYN) IVPB 3.375 g  Status:  Discontinued     3.375 g 100 mL/hr over 30 Minutes Intravenous Every 8 hours 06/09/17 1006 06/09/17 1007   06/09/17 1100  piperacillin-tazobactam (ZOSYN) IVPB 3.375 g  Status:  Discontinued     3.375 g 12.5 mL/hr over 240 Minutes Intravenous Every 8 hours 06/09/17 1007 06/13/17 1555   06/09/17 1030  vancomycin (VANCOCIN) 1,500 mg in sodium chloride 0.9 % 500 mL IVPB     1,500 mg 250 mL/hr over 120 Minutes Intravenous  Once 06/09/17 1016 06/09/17 1409   06/03/2017 1800  clindamycin (CLEOCIN) IVPB 600 mg  Status:  Discontinued     600 mg 100 mL/hr over 30 Minutes Intravenous Every 8 hours 05/19/2017 1658 06/09/17 1006   06/14/2017 1730  Ampicillin-Sulbactam (UNASYN) 3 g in sodium chloride 0.9 % 100 mL IVPB  Status:  Discontinued     3 g 200 mL/hr over 30 Minutes Intravenous Every 6 hours 05/30/2017 1658 06/09/17 1006   05/20/2017 0945  clindamycin (CLEOCIN) IVPB 600 mg     600 mg 100 mL/hr over 30 Minutes Intravenous  Once 05/30/2017 0933 05/26/2017 1019      Assessment/Plan: s/p Procedure(s): WOUND EXPLORATION AND DRAINAGE FACE W/ DEBRIDMENT OF NECK (Bilateral) he has increased WBC . discussed with CCM and working up liver and the lungs certainly could be  source of increased WBC. His neck actually looks better with respect to necrosis and purulence. None of the tissue had any purulence. Will continue packing changes.  LOS: 20 days    [name] [name] 06/24/2017

## 2017-06-24 NOTE — Progress Notes (Addendum)
[facility] for Infectious Disease    Date of Admission:  05/27/2017   Total days of antibiotics 21        Day 2 ceftaz/day 1 cipro           ID: Ricahrd Novacek is a 61 y.o. male with severe head/neck soft tissue infection with necrotizing features and para pharyngeal abscess s/p multiple debridement. cx showing sensitive klebsiella but also developing severe R>L LL pneumonia and contrast induced AKI Active Problems:   Acute suppurative parotitis   Facial cellulitis   Respiratory failure (HCC)   Acute chest pain   Status post tracheostomy ([place])   Parapharyngeal abscess   Klebsiella infection   Necrotizing fasciitis (HCC)   Chronic hepatitis C without hepatic coma (HCC)   Acute respiratory failure with hypoxemia ([place])   HCAP (healthcare-associated pneumonia)   AKI (acute kidney injury) ([place])    Subjective: Hypothermic requiring heating blanket, and underwent dressing change at bedside still requiring some debridement. Requiring low dose pressors to tolerate HD  WBC still trending up at 16K with toxic granulations on differential.   CXR unchanged from yesterday with diffuse infiltrates on the right and some infiltrates on the left with small effusion  Micro reports + string test suggestive of hyperviscosity of isolate  ROS: unable to obtain due to sedation,trach  Medications:  . chlorhexidine gluconate (MEDLINE KIT)  15 mL Mouth Rinse BID  . Chlorhexidine Gluconate Cloth  6 each Topical Daily  . feeding supplement (PRO-STAT SUGAR FREE 64)  30 mL Per Tube TID  . heparin subcutaneous  5,000 Units Subcutaneous Q8H  . insulin aspart  2-11 Units Subcutaneous Q4H  . labetalol  10 mg Intravenous Once  . mouth rinse  15 mL Mouth Rinse 10 times per day  . nystatin   Topical BID  . pantoprazole sodium  40 mg Per Tube Daily  . sodium chloride flush  10-40 mL Intracatheter Q12H    Objective: Vital signs in last 24 hours: Temp:  [94 F (34.4 C)-97.4 F (36.3 C)] 96 F (35.6 C)  (07/08 1000) Pulse Rate:  [49-75] 58 (07/08 1000) Resp:  [10-61] 17 (07/08 1000) BP: (84-112)/(41-64) 104/50 (07/08 1000) SpO2:  [84 %-100 %] 98 % (07/08 1000) FiO2 (%):  [40 %] 40 % (07/08 0740) Weight:  [165 lb 2 oz (74.9 kg)] 165 lb 2 oz (74.9 kg) (07/08 0500)  gen = face/head is wrapped from dressing changes HEENT= scleral conjunctival edema, scleral icterus noted to left eye Neck = trach in place pulm = rhonchi bilaterally Cors= nl s1,s2, no g/m/r Abd= decrease breath sounds,  Ext = edematous from anasarca Skin= jaundice, most noticeable in legs  Lab Results  Recent Labs  06/23/17 0616 06/23/17 1645 06/24/17 0345  WBC 12.3*  --  16.2*  HGB 7.7*  --  7.1*  HCT 23.8*  --  22.4*  NA 144 141 139  K 4.4 4.5 4.2  CL 107 106 106  CO2 _0 BUN 148* 125* 82*  CREATININE 4.25* 3.77* 2.58*   Liver Panel  Recent Labs  06/22/17 0548 06/23/17 1645 06/24/17 0345  PROT 6.5 6.0*  --   ALBUMIN <1.0* <1.0*  <1.0* <1.0*  AST 120* 89*  --   ALT 22 <5*  --   ALKPHOS 302* 292*  --   BILITOT 8.0* 8.4*  --   BILIDIR  --  5.9*  --   IBILI  --  2.5*  --  Microbiology: 7/7 blood cx pending 7/7 urine cx pending 7/6 BAL pending- few GNR 7/2 BAL kleb pneumo 7/1 abscess/head - kleb pneumo 6/28 blood cx ngtd 6/25 tissue cx- kleb pneumo 6/18 blood cx NGTD  Studies/Results: Ct Chest Wo Contrast  Result Date: 06/22/2017 CLINICAL DATA:  Pneumonia EXAM: CT CHEST WITHOUT CONTRAST TECHNIQUE: Multidetector CT imaging of the chest was performed following the standard protocol without IV contrast. COMPARISON:  Chest radiographs June 22, 2017, June 20, 2017, and June 11, 2017 FINDINGS: Cardiovascular: There is no appreciable thoracic aortic aneurysm. Visualized great vessels appear normal. There is atherosclerotic calcification in the aorta. Pericardium is not appreciably thickened. Mediastinum/Nodes: Visualized thyroid appears unremarkable. There are subcentimeter mediastinal lymph  nodes. No frank adenopathy is appreciable by size criteria. Nasogastric tube extends through the esophagus with tip in the stomach. Lungs/Pleura: There is extensive airspace consolidation throughout the entire right lower lobe and much of the right middle lobe. A portion of the more medial and anterior aspects of the right middle lobe are aerated. There is consolidation in portions of the right upper lobe posteriorly. On the left, there is consolidation throughout much of the left lower lobe as well as in the inferior lingula and posterior segments of the left upper lobe. There is a minimal pleural effusion on the left. There are scattered nodular areas in the right upper lobe, right middle lobe, and left lower lobe without appreciable cavitation. The largest of these nodular lesions measures slightly greater than 1 cm. Tracheostomy catheter tip is in the distal trachea. No pneumothorax. Upper Abdomen: Despite the presence of nasogastric tube, stomach is distended with fluid and air. Visualized upper abdominal structures otherwise appear unremarkable. Musculoskeletal: There is degenerative change in the thoracic spine. There are no blastic or lytic bone lesions. IMPRESSION: 1. Widespread airspace consolidation bilaterally, more severe on the right than on the left. Multiple nodular opacities raise concern for septic emboli, although currently there is no cavitation in these lesions. There is a minimal left pleural effusion. 2.  No appreciable adenopathy by size criteria. 3.  Tube positions as described without pneumothorax. 4.  Areas of aortic atherosclerosis. Aortic Atherosclerosis (ICD10-I70.0). Electronically Signed   By: Lowella Grip III M.D.   On: 06/22/2017 14:00   Dg Chest Port 1 View  Result Date: 06/24/2017 CLINICAL DATA:  Acute respiratory failure EXAM: PORTABLE CHEST 1 VIEW COMPARISON:  June 23, 2017 FINDINGS: The feeding tube terminates below today's film. The right PICC line and tracheostomy tube  are in stable position. No pneumothorax. Infiltrate diffusely throughout the right lung, most prominent in the lower lung is stable. Mild opacity and probable tiny effusion on the left is stable. No other interval changes. IMPRESSION: Stable support apparatus. Bilateral pulmonary infiltrates, right greater than left, are also stable. Electronically Signed   By: Dorise Bullion III M.D   On: 06/24/2017 06:43   Dg Chest Port 1 View  Result Date: 06/23/2017 CLINICAL DATA:  Initial evaluation for acute respiratory failure with hypoxemia. EXAM: PORTABLE CHEST 1 VIEW COMPARISON:  Prior radiograph from 06/22/2017. FINDINGS: Tracheostomy tube overlies the upper airway. Feeding tube courses in the the abdomen. Right-sided PICC catheter in place with tip overlying the distal SVC. Stable cardiomegaly. Mediastinal silhouette within normal limits. Lungs mildly hypoinflated. Bilateral airspace opacities, right worse than left, overall similar to previous, and concerning for multifocal infection/pneumonia. Superimposed bilateral pleural effusions. Underlying pulmonary vascular congestion without overt pulmonary edema. No pneumothorax. Osseous structures unchanged. IMPRESSION: 1. Support apparatus in satisfactory position  as above. 2. No appreciable interval changed in multifocal bilateral airspace opacities, right worse than left, again concerning for infection/pneumonia. 3. Superimposed small bilateral pleural effusions. Electronically Signed   By: Jeannine Boga M.D.   On: 06/23/2017 06:46     Assessment/Plan: 61yo M with history of chronic hep c with signs of cirrhosis on imaging, and DM admitted originally for parotitis but evolved to necrotizing deep tissue infection of head and neck c/b respiratory distress, secondary pneumonia 2/2 klebsiella pneumoniae. Also has AKI now on renal replacement therapy, anemia, and afib. Over the last 3 days having worsening jaundice and pneumonia is also worsening, underwent  bronch. Febrile and worsening leukocytosis < 48hrs. Suspect that he has hypervirulent Kleb pneumonaie.   Deep tissue infection of head/neck = currently on  ceftaz. Will switch to ceftaz temporarily due to worsening fevers and leukocytosis - while we see if any new data comes from recent repeat cultures. Published studies on hypervirulent Kleb Pneumonaie patients do better on 3rd gen cephalosporins + despite isolate being pan-sensitive.   Leukocytosis/hypothermia = worsening clinical picture could be due to severe pneumonia but would also recommend to image his liver to see if any evidence of new metastatic disease due hyperbilirubinemia.  Severe KP pneumonia= primary team added cipro to ceftaz due to progression while BAL cx are pending  Anemia = still has blood loss associated with wounds, recommend to check cbcthis afternoon to see whether needs rbc tsf. Will check inr/pt  Jaundice = total bili at 8.4 yesterday with predominant conjugated hyperbili.  AKI = Nephrology on board monitoring UOP, patient on HD  Anasarca = related to low serum albumin,3rd spacing in setting of sepsis  Chronic hep c without hepatic coma = geno 1a, with viremia. ast elevated> alt in recent labs    [facility], [facility] Cell: [phone] Pager: [phone]  06/24/2017, 11:28 AM

## 2017-06-24 NOTE — Progress Notes (Signed)
eLink Physician-Brief Progress Note Patient Name: Conrado Sena DOB: Apr 26, 1956 MRN: [id number]   Date of Service  06/24/2017  HPI/Events of Note  Hypotension - BP = 93/45 with MAP = 60. Currently on a Norepinephrine IV infusion at 400 mcg/hour.   eICU Interventions  Will order: 1. Norepinephrine IV infusion. Titrate to MAP >= 65, 2. ABG STAT.     Intervention Category Major Interventions: Hypotension - evaluation and management  Krystofer Hevener Eugene 06/24/2017, 4:34 PM

## 2017-06-24 NOTE — Progress Notes (Signed)
CPT held at this time, pt unstable, MD notified.

## 2017-06-24 NOTE — Progress Notes (Signed)
CPT held at this time due to pt's instability. RT will continue to monitor.

## 2017-06-24 NOTE — Progress Notes (Signed)
eLink Physician-Brief Progress Note Patient Name: [name] [name] DOB: 1956/08/22 MRN: [id number]   Date of Service  06/24/2017  HPI/Events of Note  Multiple issues: 1. Anterior ST elevation - Possible STEMI and ABG on 40%/PRVC 20/TV 400/P 10 = 7.187/55.9/89.9/20.0. BP = 98/45 on Phenylephrine and Norepinephrine IV infusions. Therefore can't B-Block.  eICU Interventions  Will order: 1. Increase PRVC rate to 32.  2. ABG at 7 PM. 3. Cycle Troponin - if Troponin elevated will consult cardiology.  4. Heparin IV infusion per pharmacy consult. 5. ASA 81 mg per tube now and Q day.      Intervention Category Major Interventions: Acid-Base disturbance - evaluation and management;Respiratory failure - evaluation and management Intermediate Interventions: Diagnostic test evaluation  Sohrab Keelan Dennard N[place]ene 06/24/2017, 5:34 PM

## 2017-06-24 NOTE — Progress Notes (Signed)
PULMONARY / CRITICAL CARE MEDICINE   Name: [name] [name] MRN: [id number] DOB: 04-11-1956    ADMISSION DATE:  05/22/2017 CONSULTATION DATE:  05/25/2017  REFERRING MD:  Dr. Thereasa Solo  CHIEF COMPLAINT:  Right parapharyngeal and neck abscesses  HISTORY OF PRESENT ILLNESS:    61 y.o. male with PMH as below, which is significant for DM, HTN, and Hepatitis C. He was in his usual state of health until Friday 6/15 when he noticed some minor R sided facial swelling. This continued to worsen in the following days and he developed associated fevers/chills, dyspnea, and dysphagia. He presented to urgent care 6/17 and was prescribed Clindamycin. He took his first dose, but was unable to take any subsequent doses due to dysphagia. He presented to [facility] ED 6/18 for this complaint. He had CT scan done which demonstrated right parotitis and facial cellulitis. Due to concern for airway compromise, PCCM asked to evaluate for admission. He was found to have parapharyngeal abscess and underwent I&D and trach 6/25.  SUBJECTIVE:  Dressing changed by ENT this morning: still some areas of purulence ntoed Renal function worsening   VITAL SIGNS: BP (!) 96/50   Pulse (!) 57   Temp (!) 94 F (34.4 C) (Rectal)   Resp 19   Ht 5' (1.524 m)   Wt 74.9 kg (165 lb 2 oz)   SpO2 98%   BMI 32.25 kg/m   HEMODYNAMICS:    VENTILATOR SETTINGS: Vent Mode: PRVC FiO2 (%):  [40 %] 40 % Set Rate:  [20 bmp] 20 bmp Vt Set:  [400 mL] 400 mL PEEP:  [10 cmH20] 10 cmH20 Plateau Pressure:  [21 cmH20-28 cmH20] 23 cmH20  INTAKE / OUTPUT: I/O last 3 completed shifts: In: 1856 [I.V.:2493; NG/GT:1860; IV DJSHFWYOV:785] Out: 8850 [Urine:385; Other:3398]  PHYSICAL EXAMINATION:   General:  In bed on vent HENT: Dressing changes, tracheostomy  PULM: CTA B, vent supported breathing CV: RRR, no mgr GI: BS+, soft, nontender MSK: normal bulk and tone Neuro: sedated on vent Derm: jaundice  LABS:  BMET  Recent Labs Lab  06/23/17 0616 06/23/17 1645 06/24/17 0345  NA 144 141 139  K 4.4 4.5 4.2  CL 107 106 106  CO2 _0 BUN 148* 125* 82*  CREATININE 4.25* 3.77* 2.58*  GLUCOSE 200* 205* 126*    Electrolytes  Recent Labs Lab 06/19/17 0345  06/21/17 0540  06/23/17 0616 06/23/17 1645 06/24/17 0345  CALCIUM 6.8*  < > 7.3*  < > 7.7* 7.3* 7.4*  MG 2.1  --  2.5*  --   --   --  2.3  PHOS 7.4*  --  7.0*  --   --  6.8* 4.8*  < > = values in this interval not displayed.  CBC  Recent Labs Lab 06/22/17 0548 06/23/17 0616 06/24/17 0345  WBC 7.7 12.3* 16.2*  HGB 8.1* 7.7* 7.1*  HCT 25.6* 23.8* 22.4*  PLT 76* 125* 142*    Coag's No results for input(s): APTT, INR in the last 168 hours.  Sepsis Markers No results for input(s): LATICACIDVEN, PROCALCITON, O2SATVEN in the last 168 hours.  ABG  Recent Labs Lab 07/14/2017 0841 06/18/17 0335 06/18/17 1208  PHART 7.425 7.334* 7.330*  PCO2ART 44.6 46.2 49.5*  PO2ART 384.0* 73.7* 71.0*    Liver Enzymes  Recent Labs Lab 06/20/17 0419  06/22/17 0548 06/23/17 1645 06/24/17 0345  AST 124*  --  120* 89*  --   ALT 22  --  22 <5*  --  ALKPHOS 219*  --  302* 292*  --   BILITOT 3.6*  --  8.0* 8.4*  --   ALBUMIN <1.0*  < > <1.0* <1.0*  <1.0* <1.0*  < > = values in this interval not displayed.  Cardiac Enzymes No results for input(s): TROPONINI, PROBNP in the last 168 hours.  Glucose  Recent Labs Lab 06/23/17 1155 06/23/17 1619 06/23/17 1949 06/24/17 0006 06/24/17 0400 06/24/17 0801  GLUCAP 228* 197* 159* 124* 121* 118*   Imaging   STUDIES:  CT Neck 6/18 > Diffuse facial swelling favored to represent bacterial RIGHT parotitis. Significant facial cellulitis, with RIGHT muscle of mastication enlargement. No visible calculi. No drainable abscess. Minor dental caries and periapical lucencies, not felt to be the source of inflammation. CXR 6/22 > Low lung volumes with right basilar atelectasis. Upper normal heart size likely  exaggerated by technique and low lung volumes. Left lung base and costophrenic angle not included in the field of view. Frontal and lateral views may be helpful if patient is able. CT neck 6/22 > Extensive severe multi compartmental infectious/inflammatory process involving the right face from the right periorbital soft tissues to the upper neck. Inflammation involves the all right-sided deep cervical compartments including right parapharyngeal and retropharyngeal spaces. There multiple ill-defined fluid collections probably representing phlegmon. No discrete rim enhancing abscess is Identified. There is rightward displacement of the airway and moderate narrowing of the airway due to fluid collections in parapharyngeal space and swelling of oral and hypopharyngeal mucosa. The origin of infection is uncertain, possibly odontogenic given the presence of odontogenic disease. CT Neck 6/25 > Continued worsening of inflammatory disease of the right face and deep spaces. Most notable changes include enlargement of the right parapharyngeal abscess now measuring up to 3.5 cm in diameter and extending over a length of almost 6 cm with worsened mass effect upon the hypopharynx and pharynx.  6/27 CxR with ft needing advancement. 7/6 CT chest> dense consolidation in right lung, trace effusion, patchy consolidation left lung  CULTURES: Blood 6/18 > No growth  MRSA PCR 6/18 > Negative  MRSA PCP 6/25 > Negative Staphy aureus 6/25 > Negative  Aerobic/Anareboic Culture 6/25 >> Kleb P. Res to ampicillin  6.28 BC>>>NGTD 7/1 OR>>>klebsiella 7/1 abscess fungus >> Bronch bal 7/2>>>stain gram neg rod>>> Klebsiella 7/6 bronchial aspirate >> Klebsiella  7/6 bronchial aspirate fungus>  7/6 bronchial aspirate afb >  7/7 blood culture >   ANTIBIOTICS: Per ID Clindamycin 6/18 > 6/23>>> Unasyn 6/18 > 6/22 Vancomycin 6/23 >>>6/28 unasyn 6/27>>>7/1 Zosyn 6/23 >>>6/27 merom 7/1>7/3 Ceftazidine 7/3 >>> 7/4 Cefazolin  7/4 >> 7/7 Ceftazidine 7/7 >> Cipro 7/7 >>  IVIG 7/1>>> x 1, further held crt rise  SIGNIFICANT EVENTS: 6/18 > Presents to ED 6/25 > Taken to OR for I&D and Trach  6/29- fevers, vent 6/30- bleeding, blood, cauterized bedside 7/1 fib rvr, borderline BP, to OR  LINES/TUBES: Trach 6/25 >>  L femoral CVC 6/25 >>> 7/6 Left fem cvc 7/1>>> 7/6 picc 6/27>>> R fem HD cath 7/7 >>  DISCUSSION: 61 year old male presents to ED on 6/18 with right facial cellulitis/parotitis and parapharyngeal abscess s/p I&D and trach on 6/25. Has had multiple debridements since.  Has Klebsiella growing in multiple sources (lungs, facial abscesses).  No known immunocompromised state.  Received 1 dose of IVIg which was associated with ATN and now AKI necessitating hemodialysis.  Remains mechanically ventilated and heavily sedated.  As of 7/6 his bronch cultures still show Klebsiella.  ASSESSMENT /  PLAN:  PULMONARY A: Respiratory Insufficieny in setting of facial cellulitis/focal abscess s/p trach  Acute respiratory failure with hypoxemia due to HCAP P:   Full mechanical vent support VAP prevention Daily WUA/SBT Continue high PEEP  Pulm toilette measures  CARDIOVASCULAR A:  AF RVR> resolved HTN Hypotension> sedation related Bradycardia P:  Hold amiodarone Continue neosynephrine for MAP > 65 Tele  A:   AKI/ATN > worsening Anasarca P:   Continue volume removal with CVVHD Monitor BMET and UOP Replace electrolytes as needed   GASTROINTESTINAL A:   Hep C genotype 1 with splenomegaly - ? Untreated Protein calorie malnutrition Obstructive jaundice pattern> case reports in literature  P:   Check RUQ ultrasound (case reports show that patients with necrotizing klebsiella also had liver abscesses Continue PPI Continue tube feedings   HEMATOLOGIC A:   Anemia> s/p transfusion PRBC 7/5 Very high DVT risk P:  Transfuse for Hgb < 7gm/dL Continue sub q heparin  INFECTIOUS A:    Right Acute Parotitis with extensive facial cellulitis and focal right parapharyngeal abscess s/p I&D with Klebsiella P. Res to ampicillin - necrotizing Klebsiella PNA  HCAP with Klebsiella on repeat bronchoscopy 7/6 Fungus on biopsy> just topical candida per ID review of images P:   Given persistence of cultures and worsening clinical status have asked pharmacy to cover with ceftaz and cipro Agree with sending for K1 genotype given case reports of severe necrotizing klebsiella infection in [place] patients with chronic viral hepatitis Follow up susceptibility patterns from 7/6 resp culture F/u 7/7 blood culture  ENDOCRINE A:   DM with current hyperglycemia > better P:   Continue SSI with tube feeding coverage  NEUROLOGIC A:   Acute Encephalopathy secondary to sedation > heavily sedated 7/8 Pain severe, requiring limited WUA P:   WUA daily > mandator RASS goal -3 Continue fentanyl gtt Stop versed infusion Head CT if not awake after holding drips today  FAMILY  - Inter-disciplinary family meet or Palliative Care meeting due by:  06/18/17 - done DF , ENT  Update: Wife updated 7/6 with translator in detail by [place]  My cc time 35 minutes  Roselie Awkward, MD [facility] Pager: [phone] Cell: [phone] After 3pm or if no response, call [phone]   06/24/2017 8:07 AM

## 2017-06-24 NOTE — Progress Notes (Signed)
Pt's BP is still going down. HR was sustaining for several minutes in 120-130, RR in 40. Fentanyl restarted. Neo on max, E link called and notified about HR, RR, paradoxical breathing and increase of Temperature. See new orders (ABG and Levo)

## 2017-06-24 NOTE — Progress Notes (Signed)
ANTICOAGULATION CONSULT NOTE - Initial Consult  Pharmacy Consult for Heparin Indication: chest pain/ACS  No Known Allergies  Patient Measurements: Height: 5' (152.4 cm) Weight: 165 lb 2 oz (74.9 kg) IBW/kg (Calculated) : 50 Heparin Dosing Weight: 66.25 kg  Vital Signs: Temp: 98.1 F (36.7 C) (07/08 1245) Temp Source: Rectal (07/08 1245) BP: 97/44 (07/08 1600) Pulse Rate: 71 (07/08 1600)  Labs:  Recent Labs  06/22/17 0548 06/23/17 0616 06/23/17 1645 06/24/17 0345 06/24/17 1600  HGB 8.1* 7.7*  --  7.1*  --   HCT 25.6* 23.8*  --  22.4*  --   PLT 76* 125*  --  142*  --   CREATININE 3.25* 4.25* 3.77* 2.58* 2.00*    Estimated Creatinine Clearance: 33.8 mL/min (A) (by C-G formula based on SCr of 2 mg/dL (H)).   Medical History: Past Medical History:  Diagnosis Date  . Diabetes mellitus without complication ([place])   . Hepatitis C   . Hypertension     Medications:  Scheduled:  . aspirin  81 mg Per Tube Daily  . chlorhexidine gluconate (MEDLINE KIT)  15 mL Mouth Rinse BID  . Chlorhexidine Gluconate Cloth  6 each Topical Daily  . feeding supplement (PRO-STAT SUGAR FREE 64)  30 mL Per Tube TID  . heparin subcutaneous  5,000 Units Subcutaneous Q8H  . insulin aspart  2-11 Units Subcutaneous Q4H  . labetalol  10 mg Intravenous Once  . mouth rinse  15 mL Mouth Rinse 10 times per day  . nystatin   Topical BID  . pantoprazole sodium  40 mg Per Tube Daily  . sodium chloride flush  10-40 mL Intracatheter Q12H   Infusions:  . sodium chloride 250 mL (05/25/2017 1500)  . cefTAZidime (FORTAZ)  IV Stopped (06/24/17 1330)  . ciprofloxacin Stopped (06/24/17 1431)  . dextrose 5 % and 0.45% NaCl 10 mL/hr at 06/23/17 1507  . feeding supplement (GLUCERNA 1.2 CAL) Stopped (06/24/17 1100)  . fentaNYL infusion INTRAVENOUS 100 mcg/hr (06/24/17 1526)  . heparin    . norepinephrine (LEVOPHED) Adult infusion 4 mcg/min (06/24/17 1701)  . phenylephrine (NEO-SYNEPHRINE) Adult infusion 400  mcg/min (06/24/17 1541)  . dialysis replacement fluid (prismasate) 500 mL/hr at 06/24/17 0948  . dialysis replacement fluid (prismasate) 300 mL/hr at 06/24/17 0627  . dialysate (PRISMASATE) 1,500 mL/hr at 06/24/17 1644  . sodium chloride      Assessment: Pt with anterior ST elevation, suspected NSTEMI. Pharmacy consulted to dose heparin. Hgb unstable, currently 7.1, and Plt low, currently 142, with active facial bleeding due to wounds and requiring transfusions due to anemia. Last transfusion on 7/6. Pt was receiving heparin 5000 units q8h, last dose 7/8 @ 1318. Pt on CRRT.  Goal of Therapy:  Heparin level 0.3-0.7 units/ml Monitor platelets by anticoagulation protocol: Yes   Plan: Hold bolus dose 2/2 bleeding, transfusions Start heparin infusion at 800 units/hr Check anti-Xa level in 8 hours and daily while on heparin Continue to monitor H&H and platelets   Leeroy Cha, PharmD PGY1 Pharmacy Resident

## 2017-06-25 ENCOUNTER — Inpatient Hospital Stay (HOSPITAL_COMMUNITY): Payer: 59

## 2017-06-25 DIAGNOSIS — R17 Unspecified jaundice: Secondary | ICD-10-CM

## 2017-06-25 LAB — BLOOD GAS, ARTERIAL
Acid-base deficit: 20.8 mmol/L — ABNORMAL HIGH (ref 0.0–2.0)
Acid-base deficit: 15.9 mmol/L — ABNORMAL HIGH (ref 0.0–2.0)
BICARBONATE: 12.2 mmol/L — AB (ref 20.0–28.0)
Bicarbonate: 8.7 mmol/L — ABNORMAL LOW (ref 20.0–28.0)
Drawn by: 330991
Drawn by: 330991
FIO2: 40
FIO2: 40
LHR: 32 {breaths}/min
MECHVT: 400 mL
O2 Saturation: 91 %
O2 Saturation: 92.6 %
PATIENT TEMPERATURE: 95.6
PATIENT TEMPERATURE: 95.5
PEEP/CPAP: 10 cmH2O
PEEP/CPAP: 10 cmH2O
PO2 ART: 77 mmHg — AB (ref 83.0–108.0)
RATE: 32 resp/min
VT: 400 mL
pCO2 arterial: 37.5 mmHg (ref 32.0–48.0)
pCO2 arterial: 40.4 mmHg (ref 32.0–48.0)
pH, Arterial: 6.979 — CL (ref 7.350–7.450)
pH, Arterial: 7.095 — CL (ref 7.350–7.450)
pO2, Arterial: 74 mmHg — ABNORMAL LOW (ref 83.0–108.0)

## 2017-06-25 LAB — CBC
HCT: 25.4 % — ABNORMAL LOW (ref 39.0–52.0)
Hemoglobin: 7.6 g/dL — ABNORMAL LOW (ref 13.0–17.0)
MCH: 27.5 pg (ref 26.0–34.0)
MCHC: 29.9 g/dL — AB (ref 30.0–36.0)
MCV: 92 fL (ref 78.0–100.0)
PLATELETS: 238 10*3/uL (ref 150–400)
RBC: 2.76 MIL/uL — ABNORMAL LOW (ref 4.22–5.81)
RDW: 19.9 % — AB (ref 11.5–15.5)
WBC: 26.4 10*3/uL — ABNORMAL HIGH (ref 4.0–10.5)

## 2017-06-25 LAB — RENAL FUNCTION PANEL
Albumin: <1 g/dL — ABNORMAL LOW (ref 3.5–5.0)
Anion gap: 15 (ref 5–15)
BUN: 41 mg/dL — AB (ref 6–20)
CHLORIDE: 103 mmol/L (ref 101–111)
CO2: 14 mmol/L — ABNORMAL LOW (ref 22–32)
CREATININE: 1.87 mg/dL — AB (ref 0.61–1.24)
Calcium: 7.3 mg/dL — ABNORMAL LOW (ref 8.9–10.3)
GFR calc Af Amer: 44 mL/min — ABNORMAL LOW (ref >60)
GFR calc non Af Amer: 38 mL/min — ABNORMAL LOW (ref >60)
GLUCOSE: 123 mg/dL — AB (ref 65–99)
POTASSIUM: 5.3 mmol/L — AB (ref 3.5–5.1)
Phosphorus: 6.4 mg/dL — ABNORMAL HIGH (ref 2.5–4.6)
Sodium: 132 mmol/L — ABNORMAL LOW (ref 135–145)

## 2017-06-25 LAB — GLUCOSE, CAPILLARY
GLUCOSE-CAPILLARY: 29 mg/dL — AB (ref 65–99)
GLUCOSE-CAPILLARY: 28 mg/dL — AB (ref 65–99)
Glucose-Capillary: 83 mg/dL (ref 65–99)

## 2017-06-25 LAB — HEPARIN LEVEL (UNFRACTIONATED): HEPARIN UNFRACTIONATED: 0.1 [IU]/mL — AB (ref 0.30–0.70)

## 2017-06-25 LAB — MAGNESIUM: Magnesium: 2.3 mg/dL (ref 1.7–2.4)

## 2017-06-25 LAB — LACTIC ACID, PLASMA
LACTIC ACID, VENOUS: 12.8 mmol/L — AB (ref 0.5–1.9)
LACTIC ACID, VENOUS: 14.7 mmol/L — AB (ref 0.5–1.9)

## 2017-06-25 MED ORDER — HYDROCORTISONE NA SUCCINATE PF 100 MG IJ SOLR
50.0000 mg | Freq: Four times a day (QID) | INTRAMUSCULAR | Status: DC
  Administered 2017-06-25: 50 mg via INTRAVENOUS
  Filled 2017-06-25: qty 1
  Filled 2017-06-25: qty 2

## 2017-06-25 MED ORDER — SODIUM CHLORIDE 0.9 % IV BOLUS (SEPSIS)
1000.0000 mL | Freq: Once | INTRAVENOUS | Status: AC
  Administered 2017-06-25: 1000 mL via INTRAVENOUS

## 2017-06-25 MED ORDER — SODIUM BICARBONATE 8.4 % IV SOLN
100.0000 meq | Freq: Once | INTRAVENOUS | Status: AC
  Administered 2017-06-25: 100 meq via INTRAVENOUS

## 2017-06-25 MED ORDER — STERILE WATER FOR INJECTION IV SOLN
INTRAVENOUS | Status: DC
  Administered 2017-06-25: 12:00:00 via INTRAVENOUS_CENTRAL
  Filled 2017-06-25 (×5): qty 150

## 2017-06-25 MED ORDER — STERILE WATER FOR INJECTION IV SOLN
INTRAVENOUS | Status: DC
  Filled 2017-06-25 (×6): qty 150

## 2017-06-25 MED ORDER — SODIUM BICARBONATE 8.4 % IV SOLN
INTRAVENOUS | Status: AC
  Filled 2017-06-25: qty 100

## 2017-06-25 MED ORDER — DEXTROSE 50 % IV SOLN
50.0000 mL | Freq: Once | INTRAVENOUS | Status: AC
  Administered 2017-06-25: 50 mL via INTRAVENOUS

## 2017-06-25 MED ORDER — SODIUM CHLORIDE 0.9 % IV SOLN
10.0000 mg/h | INTRAVENOUS | Status: DC
  Administered 2017-06-25: 10 mg/h via INTRAVENOUS
  Filled 2017-06-25: qty 10

## 2017-06-25 MED ORDER — VASOPRESSIN 20 UNIT/ML IV SOLN
0.0300 [IU]/min | INTRAVENOUS | Status: DC
  Administered 2017-06-25: 0.03 [IU]/min via INTRAVENOUS
  Filled 2017-06-25: qty 2

## 2017-06-25 MED ORDER — STERILE WATER FOR INJECTION IV SOLN
INTRAVENOUS | Status: DC
  Filled 2017-06-25 (×3): qty 300

## 2017-06-25 MED ORDER — MORPHINE BOLUS VIA INFUSION
5.0000 mg | INTRAVENOUS | Status: DC | PRN
  Filled 2017-06-25: qty 20

## 2017-06-25 MED ORDER — STERILE WATER FOR INJECTION IV SOLN
INTRAVENOUS | Status: DC
  Administered 2017-06-25: 04:00:00 via INTRAVENOUS
  Filled 2017-06-25 (×3): qty 850

## 2017-06-25 MED ORDER — NOREPINEPHRINE BITARTRATE 1 MG/ML IV SOLN
0.0000 ug/min | INTRAVENOUS | Status: DC
  Administered 2017-06-25: 25 ug/min via INTRAVENOUS
  Administered 2017-06-25: 40 ug/min via INTRAVENOUS
  Filled 2017-06-25 (×3): qty 16

## 2017-06-25 MED ORDER — DEXTROSE 50 % IV SOLN
INTRAVENOUS | Status: AC
  Filled 2017-06-25: qty 50

## 2017-06-25 MED ORDER — DEXTROSE 50 % IV SOLN
INTRAVENOUS | Status: AC
  Administered 2017-06-25: 50 mL
  Filled 2017-06-25: qty 50

## 2017-06-25 MED ORDER — SODIUM CHLORIDE 0.9 % IV SOLN
2.0000 g | Freq: Once | INTRAVENOUS | Status: AC
  Administered 2017-06-25: 2 g via INTRAVENOUS
  Filled 2017-06-25: qty 20

## 2017-06-25 MED ORDER — SODIUM CHLORIDE 0.9 % IV SOLN
1.0000 g | Freq: Once | INTRAVENOUS | Status: AC
  Administered 2017-06-25: 1 g via INTRAVENOUS
  Filled 2017-06-25: qty 10

## 2017-06-25 MED ORDER — SODIUM BICARBONATE 8.4 % IV SOLN
INTRAVENOUS | Status: DC
  Administered 2017-06-25: 12:00:00 via INTRAVENOUS_CENTRAL
  Filled 2017-06-25 (×6): qty 150

## 2017-06-25 MED FILL — Medication: Qty: 1 | Status: AC

## 2017-06-27 ENCOUNTER — Telehealth: Payer: Self-pay

## 2017-06-27 NOTE — Telephone Encounter (Signed)
On 06/27/17 I received [facility]original[facility] The death certificate is for burial. The patient is a patient of Doctor Maneem. [facility] this am for signature.  On 06/28/2017 I received the death certificate back from Doctor Maneem. I got the death certificate ready and called the funeral home to let them know the death certificate is ready for pickup.

## 2017-06-28 LAB — CULTURE, BLOOD (ROUTINE X 2)
Culture: NO GROWTH
Culture: NO GROWTH
SPECIAL REQUESTS: ADEQUATE
Special Requests: ADEQUATE

## 2017-07-18 LAB — FUNGUS CULTURE WITH STAIN

## 2017-07-18 LAB — FUNGUS CULTURE RESULT

## 2017-07-18 LAB — FUNGAL ORGANISM REFLEX

## 2017-07-18 NOTE — Procedures (Signed)
Arterial Catheter Insertion Procedure Note [name] [name] [id number] 1956/03/01  Procedure: Insertion of Arterial Catheter  Indications: Blood pressure monitoring  Procedure Details Consent: Risks of procedure as well as the alternatives and risks of each were explained to the (patient/caregiver).  Consent for procedure obtained. Time Out: Verified patient identification, verified procedure, site/side was marked, verified correct patient position, special equipment/implants available, medications/allergies/relevent history reviewed, required imaging and test results available.  Performed  Maximum sterile technique was used including antiseptics, cap, gloves, gown, hand hygiene, mask and sheet. Skin prep: Chlorhexidine; local anesthetic administered 22 gauge catheter was inserted into left radial artery using the Seldinger technique.  Evaluation Blood flow good; BP tracing good. Complications: No apparent complications.   Armando GangMike, Gavin Telford C 07/04/2017

## 2017-07-18 NOTE — Progress Notes (Signed)
Lactic acid 14.2 and CBG 28 resulted to Dr Terese DoorManaam withe dextrose 50% 50 mls given

## 2017-07-18 NOTE — Progress Notes (Signed)
CPT not performed. Patient unstable.

## 2017-07-18 NOTE — Progress Notes (Signed)
Pt showed rhythm change on monitor. Idolina PrimerK. Eubanks NP aware, EKG showed ST with 2nd degree AV block. Cardiac MD notified, cardiac MD at bedside and states rhythm is NSR trigeminy /bigeminy. Electrolytes replacement orders given.

## 2017-07-18 NOTE — Progress Notes (Signed)
   07/16/2017 1305  Clinical Encounter Type  Visited With Patient and family together  Visit Type Other (Comment) ([place] consult)  Spiritual Encounters  Spiritual Needs Prayer  Stress Factors  Patient Stress Factors Major life changes  Family Stress Factors Family relationships  Introduction to family as Pt intubated. Offered prayer of comfort and peace.

## 2017-07-18 NOTE — Death Summary Note (Signed)
DEATH SUMMARY   Patient Details  Name: [name] [name] MRN: [id number] DOB: 16-Oct-1956  Admission/Discharge Information   Admit Date:  2017/06/19  Date of Death: Date of Death: 07-10-17  Time of Death: Time of Death: 1333/02/11  Length of Stay: 02/22/2023  Referring Physician: Leandrew Koyanagi, MD (Inactive)   Reason(s) for Hospitalization  Facial swelling  Diagnoses  Preliminary cause of death:  Secondary Diagnoses (including complications and co-morbidities):  Active Problems:   Acute suppurative parotitis   Facial cellulitis   Respiratory failure (HCC)   Acute chest pain   Status post tracheostomy ([place])   Parapharyngeal abscess   Klebsiella infection   Necrotizing fasciitis (HCC)   Chronic hepatitis C without hepatic coma (HCC)   Acute respiratory failure with hypoxemia (HCC)   HCAP (healthcare-associated pneumonia)   AKI (acute kidney injury) [facility])   Jaundice   Brief Hospital Course (including significant findings, care, treatment, and services provided and events leading to death)  [name] [name] is a 61 y.o. year old male who was in usual state of health until 6/15 when he noticed some R sided facial swelling that subsequently worsened, developed associated fevers/chills, dyspnea, and dysphagia. He presented to urgent care 6/17 and was prescribed Clindamycin. He took his first dose, but was unable to take any subsequent doses due to dysphagia. He presented to [facility] ED Jun 20, 2023 and was found to have right parotitis and facial cellulitis on CT. He was admitted to ICU for concern for airway compromise. He was found to have parapharyngeal abscess and underwent I&D and tracheotomy placement on 6/25. Was found to grow Klebsiella from multiple sources (lungs, facial abscesses that required multiple debridements and bronchoscopy). He had IVIG which was associated with ATN and AKI necessitating CVVHD. He worsened clinically with continued fevers, further deterioration requiring full mechanical  ventilation. He developed Afib with RVR with elevated troponin and hypotension requiring 3 pressors without much benefit. Given his worsening lactic metabolic acidosis with worsening septic shock, discussed with family about patient's prognosis. Family decided to make patient comfort measures with terminal extubation and patient died.   Pertinent Labs and Studies  Significant Diagnostic Studies Ct Soft Tissue Neck W Contrast Result Date: 05/29/2017 IMPRESSION: 1. Interval debridement of right periorbital, right facial subcutaneous, and right paramandibular fluid collections with multiple drains in situ. Debrided collections are much decreased in size in comparison with the prior study. 2. Stable large right parapharyngeal abscess measuring up to 6 mm craniocaudal. 3. Increased effacement of the pharyngeal airway, now complete, due to mass effect and debris. 4. Interval worsening of paranasal sinus and mastoid disease with fluid levels probably due to poor drainage due to airway effacement. 5. Large abscess within right temporalis muscle in the right lateral scalp, not included within the field of view on prior study. 6. Stable abscess within the right sublingual space and right submandibular gland. 7. Stable extensive suppurative change within right parotid and submandibular glands. Electronically Signed   By: Kristine Garbe M.D.   On: 06/12/2017 22:21   Ct Soft Tissue Neck W Contrast Result Date: 06/08/2017 IMPRESSION: 1. Extensive severe multi compartmental infectious/inflammatory process involving the right face from the right periorbital soft tissues to the upper neck. Inflammation involves the all right-sided deep cervical compartments including right parapharyngeal and retropharyngeal spaces. There multiple ill-defined fluid collections probably representing phlegmon. No discrete rim enhancing abscess is identified. 2. There is rightward displacement of the airway and moderate narrowing of  the airway due to fluid collections in parapharyngeal  space and swelling of oral and hypopharyngeal mucosa. 3. The origin of infection is uncertain, possibly odontogenic given the presence of odontogenic disease. These results will be called to the ordering clinician or representative by the Radiologist Assistant, and communication documented in the PACS or zVision Dashboard. Electronically Signed   By: Kristine Garbe M.D.   On: 06/08/2017 14:13    Ct Chest Wo Contrast Result Date: 06/22/2017  IMPRESSION: 1. Widespread airspace consolidation bilaterally, more severe on the right than on the left. Multiple nodular opacities raise concern for septic emboli, although currently there is no cavitation in these lesions. There is a minimal left pleural effusion. 2.  No appreciable adenopathy by size criteria. 3.  Tube positions as described without pneumothorax. 4.  Areas of aortic atherosclerosis. Aortic Atherosclerosis (ICD10-I70.0). Electronically Signed   By: Lowella Grip III M.D.   On: 06/22/2017 14:00   Dg Chest Port 1 View Result Date: 06/24/2017 IMPRESSION: Stable support apparatus. Bilateral pulmonary infiltrates, right greater than left, are also stable. Electronically Signed   By: Dorise Bullion III M.D   On: 06/24/2017 06:43    US Abdomen Limited Ruq Result Date: 07/08/17 IMPRESSION: Tumefactive sludge fill the gallbladder without gallbladder wall thickening. No definite gallstones or biliary dilatation Possible cirrhosis with mild ascites. Electronically Signed   By: Franchot Gallo M.D.   On: 08-Jul-2017 07:39    Microbiology Recent Results (from the past 240 hour(s))  Aerobic/Anaerobic Culture (surgical/deep wound)     Status: None   Collection Time: 06/28/2017  9:35 AM  Result Value Ref Range Status   Specimen Description ABSCESS HEAD  Final   Special Requests RIGHT TEMPORAL FOSSA  Final   Gram Stain   Final    RARE WBC PRESENT, PREDOMINANTLY PMN FEW GRAM NEGATIVE RODS     Culture   Final    MODERATE KLEBSIELLA PNEUMONIAE NO ANAEROBES ISOLATED    Report Status 06/24/2017 FINAL  Final   Organism ID, Bacteria KLEBSIELLA PNEUMONIAE  Final      Susceptibility   Klebsiella pneumoniae - MIC*    AMPICILLIN >=32 RESISTANT Resistant     CEFAZOLIN <=4 SENSITIVE Sensitive     CEFEPIME <=1 SENSITIVE Sensitive     CEFTAZIDIME <=1 SENSITIVE Sensitive     CEFTRIAXONE <=1 SENSITIVE Sensitive     CIPROFLOXACIN <=0.25 SENSITIVE Sensitive     GENTAMICIN <=1 SENSITIVE Sensitive     IMIPENEM <=0.25 SENSITIVE Sensitive     TRIMETH/SULFA <=20 SENSITIVE Sensitive     AMPICILLIN/SULBACTAM 4 SENSITIVE Sensitive     PIP/TAZO <=4 SENSITIVE Sensitive     Extended ESBL NEGATIVE Sensitive     * MODERATE KLEBSIELLA PNEUMONIAE  Fungus Culture With Stain     Status: None (Preliminary result)   Collection Time: 07/05/2017  9:35 AM  Result Value Ref Range Status   Fungus Stain Final report  Final    Comment: (NOTE) Performed At: [facility] [place], [place] [id number] Lindon Romp MD [id number]    Fungus (Mycology) Culture PENDING  Incomplete   Fungal Source ABSCESS  Final    Comment: HEAD SWAB FROM RIGHT TEMPORAL FOSSA   Fungus Culture Result     Status: None   Collection Time: 07/15/2017  9:35 AM  Result Value Ref Range Status   Result 1 Comment  Final    Comment: (NOTE) KOH/Calcofluor preparation:  no fungus observed. Performed At: [facility] [place], [place] [id number] Lindon Romp MD QT:[id number]  Culture, bal-quantitative     Status: Abnormal   Collection Time: 06/18/17 11:15 AM  Result Value Ref Range Status   Specimen Description BRONCHIAL ALVEOLAR LAVAGE  Final   Special Requests NONE  Final   Gram Stain   Final    MODERATE WBC PRESENT, PREDOMINANTLY PMN FEW GRAM NEGATIVE RODS    Culture >=100,000 COLONIES/mL KLEBSIELLA PNEUMONIAE (A)  Final   Report Status 06/20/2017 FINAL  Final   Organism ID,  Bacteria KLEBSIELLA PNEUMONIAE (A)  Final      Susceptibility   Klebsiella pneumoniae - MIC*    AMPICILLIN >=32 RESISTANT Resistant     CEFAZOLIN <=4 SENSITIVE Sensitive     CEFEPIME <=1 SENSITIVE Sensitive     CEFTAZIDIME <=1 SENSITIVE Sensitive     CEFTRIAXONE <=1 SENSITIVE Sensitive     CIPROFLOXACIN <=0.25 SENSITIVE Sensitive     GENTAMICIN <=1 SENSITIVE Sensitive     IMIPENEM <=0.25 SENSITIVE Sensitive     TRIMETH/SULFA <=20 SENSITIVE Sensitive     AMPICILLIN/SULBACTAM 4 SENSITIVE Sensitive     PIP/TAZO <=4 SENSITIVE Sensitive     Extended ESBL NEGATIVE Sensitive     * >=100,000 COLONIES/mL KLEBSIELLA PNEUMONIAE  Culture, respiratory (NON-Expectorated)     Status: None   Collection Time: 06/22/17  2:21 PM  Result Value Ref Range Status   Specimen Description TRACHEAL ASPIRATE  Final   Special Requests NONE  Final   Gram Stain   Final    MODERATE WBC PRESENT,BOTH PMN AND MONONUCLEAR NO ORGANISMS SEEN    Culture FEW KLEBSIELLA PNEUMONIAE  Final   Report Status 06/24/2017 FINAL  Final   Organism ID, Bacteria KLEBSIELLA PNEUMONIAE  Final      Susceptibility   Klebsiella pneumoniae - MIC*    AMPICILLIN >=32 RESISTANT Resistant     CEFAZOLIN <=4 SENSITIVE Sensitive     CEFEPIME <=1 SENSITIVE Sensitive     CEFTAZIDIME <=1 SENSITIVE Sensitive     CEFTRIAXONE <=1 SENSITIVE Sensitive     CIPROFLOXACIN <=0.25 SENSITIVE Sensitive     GENTAMICIN <=1 SENSITIVE Sensitive     IMIPENEM <=0.25 SENSITIVE Sensitive     TRIMETH/SULFA <=20 SENSITIVE Sensitive     AMPICILLIN/SULBACTAM 4 SENSITIVE Sensitive     PIP/TAZO <=4 SENSITIVE Sensitive     Extended ESBL NEGATIVE Sensitive     * FEW KLEBSIELLA PNEUMONIAE  Fungus Culture With Stain     Status: None (Preliminary result)   Collection Time: 06/22/17  2:21 PM  Result Value Ref Range Status   Fungus Stain Final report  Final    Comment: (NOTE) Performed At: [facility] [place], [place] [id number] Lindon Romp MD PR:[id number]    Fungus (Mycology) Culture PENDING  Incomplete   Fungal Source TRACHEAL ASPIRATE  Final  Acid Fast Smear (AFB)     Status: None   Collection Time: 06/22/17  2:21 PM  Result Value Ref Range Status   AFB Specimen Processing Concentration  Final   Acid Fast Smear Negative  Final    Comment: (NOTE) Performed At: [facility] [place], [place] [id number] Lindon Romp MD RN:[id number]    Source (AFB) TRACHEAL ASPIRATE  Final  Fungus Culture Result     Status: None   Collection Time: 06/22/17  2:21 PM  Result Value Ref Range Status   Result 1 Comment  Final    Comment: (NOTE) KOH/Calcofluor preparation:  no fungus observed. Performed At: [facility] [address] [place], [place] [id number] Evette Doffing  Darrick Penna MD GQ:[id number]   Culture, Urine     Status: Abnormal   Collection Time: 06/23/17  5:38 AM  Result Value Ref Range Status   Specimen Description URINE, CATHETERIZED  Final   Special Requests NONE  Final   Culture >=100,000 COLONIES/mL YEAST (A)  Final   Report Status 06/24/2017 FINAL  Final  Culture, blood (Routine X 2) w Reflex to ID Panel     Status: None (Preliminary result)   Collection Time: 06/23/17  6:16 AM  Result Value Ref Range Status   Specimen Description BLOOD RIGHT HAND  Final   Special Requests   Final    BOTTLES DRAWN AEROBIC ONLY Blood Culture adequate volume   Culture NO GROWTH 3 DAYS  Final   Report Status PENDING  Incomplete  Culture, blood (Routine X 2) w Reflex to ID Panel     Status: None (Preliminary result)   Collection Time: 06/23/17  6:28 AM  Result Value Ref Range Status   Specimen Description BLOOD LEFT HAND  Final   Special Requests   Final    BOTTLES DRAWN AEROBIC AND ANAEROBIC Blood Culture adequate volume   Culture NO GROWTH 3 DAYS  Final   Report Status PENDING  Incomplete    Lab Basic Metabolic Panel:  Recent Labs Lab 06/21/17 0540  06/23/17 0616 06/23/17 1645  06/24/17 0345 06/24/17 1600 06/27/2017 0025  NA 143  < > 144 141 139 136 132*  K 3.9  < > 4.4 4.5 4.2 5.1 5.3*  CL 106  < > 107 106 106 105 103  CO2 27  < > _0 14*  GLUCOSE 230*  < > 200* 205* 126* 134* 123*  BUN 103*  < > 148* 125* 82* 50* 41*  CREATININE 2.79*  < > 4.25* 3.77* 2.58* 2.00* 1.87*  CALCIUM 7.3*  < > 7.7* 7.3* 7.4* 7.4* 7.3*  MG 2.5*  --   --   --  2.3  --  2.3  PHOS 7.0*  --   --  6.8* 4.8* 4.7* 6.4*  < > = values in this interval not displayed. Liver Function Tests:  Recent Labs Lab 06/22/17 0548 06/23/17 1645 06/24/17 0345 06/24/17 1600 06-27-17 0025  AST 120* 89*  --   --   --   ALT 22 <5*  --   --   --   ALKPHOS 302* 292*  --   --   --   BILITOT 8.0* 8.4*  --   --   --   PROT 6.5 6.0*  --   --   --   ALBUMIN <1.0* <1.0*  <1.0* <1.0* <1.0* <1.0*   No results for input(s): LIPASE, AMYLASE in the last 168 hours. No results for input(s): AMMONIA in the last 168 hours. CBC:  Recent Labs Lab 06/21/17 0540 06/21/17 1749 06/22/17 0548 06/23/17 0616 06/24/17 0345 Jun 27, 2017 0025  WBC 8.6 7.9 7.7 12.3* 16.2* 26.4*  NEUTROABS 7.9*  --   --  11.0* 14.4*  --   HGB 7.0* 6.6* 8.1* 7.7* 7.1* 7.6*  HCT 22.0* 20.5* 25.6* 23.8* 22.4* 25.4*  MCV 87.0 85.4 85.3 85.6 86.8 92.0  PLT 68* 66* 76* 125* 142* 238   Cardiac Enzymes:  Recent Labs Lab 06/24/17 1731 06/24/17 2318  TROPONINI 0.20* 0.08*   Sepsis Labs:  Recent Labs Lab 06/22/17 0548 06/23/17 0616 06/24/17 0345 Jun 27, 2017 0025 06/27/17 0732 06/27/2017 1055  WBC 7.7 12.3* 16.2* 26.4*  --   --  LATICACIDVEN  --   --   --   --  12.8* 14.7*    Procedures/Operations  06/05/2017 Operation 1. Incision and drainage of right neck deep space abscess. 2. Incision and drainage of right facial abscess. 3. Incision and drainage of eyelid abscess. 4. Transoral incision and drainage of right parapharyngeal abscess. 5. Tracheostomy. 05/26/2017 Operation 1. Debridement of right facial, eyelid, and neck  abscesses. 2. Incision and drainage of right temporal abscess. 3. Drainage of right parapharyngeal abscess. 07/09/2017 Operation Multiple neck compartment exploration, debridement, and wound packing  Bufford Lope, DO PGY-2, [facility] 06/27/2017 11:57 AM

## 2017-07-18 NOTE — Procedures (Signed)
Admit: 05/23/2017 LOS: 21  Events of last 24 hours noted, persistent metabolic acidosis despite CVVHD and IV bicarb.  PCCM to have family conference  Current CRRT Prescription: Start Date: 06/23/17 Catheter: right femoral HD cath placed 06/23/17 BFR: 100-400 DFR: 4K/2.5 at 20 mL/kg/hr (1500 mL/hr)  Replacement Rate: both 4K/2.5 Dialysate, pre-filter 159m/hr, post-filter 1000 mL/hr Goal UF: 50 mL/hr Anticoagulation: heparin Clotting: occurred at 10:am today    S: Intubated/sedated  O: 07/08 0701 - 07/09 0700 In: 5313 [I.V.:4343; NG/GT:240; IV Piggyback:730] Out: 49381[Stool:50]  Filed Weights   06/23/17 0446 06/24/17 0500 0August 08, [id number]  Weight: 75.1 kg (165 lb 9.1 oz) 74.9 kg (165 lb 2 oz) 72.4 kg (159 lb 9.8 oz)     Recent Labs Lab 06/24/17 0345 06/24/17 1600 008-08-20180025  NA 139 136 132*  K 4.2 5.1 5.3*  CL 106 105 103  CO2 25 22 14*  GLUCOSE 126* 134* 123*  BUN 82* 50* 41*  CREATININE 2.58* 2.00* 1.87*  CALCIUM 7.4* 7.4* 7.3*  PHOS 4.8* 4.7* 6.4*    Recent Labs Lab 06/21/17 0540  06/23/17 0616 06/24/17 0345 02018-08-080025  WBC 8.6  < > 12.3* 16.2* 26.4*  NEUTROABS 7.9*  --  11.0* 14.4*  --   HGB 7.0*  < > 7.7* 7.1* 7.6*  HCT 22.0*  < > 23.8* 22.4* 25.4*  MCV 87.0  < > 85.6 86.8 92.0  PLT 68*  < > 125* 142* 238  < > = values in this interval not displayed.  Scheduled Meds: . aspirin  81 mg Per Tube Daily  . chlorhexidine gluconate (MEDLINE KIT)  15 mL Mouth Rinse BID  . Chlorhexidine Gluconate Cloth  6 each Topical Daily  . feeding supplement (PRO-STAT SUGAR FREE 64)  30 mL Per Tube TID  . hydrocortisone sod succinate (SOLU-CORTEF) inj  50 mg Intravenous Q6H  . insulin aspart  2-11 Units Subcutaneous Q4H  . labetalol  10 mg Intravenous Once  . mouth rinse  15 mL Mouth Rinse 10 times per day  . nystatin   Topical BID  . pantoprazole sodium  40 mg Per Tube Daily  . sodium chloride flush  10-40 mL Intracatheter Q12H   Continuous Infusions: .  sodium chloride 250 mL (06/03/2017 1500)  . cefTAZidime (FORTAZ)  IV Stopped (02018-08-080340)  . ciprofloxacin Stopped (02018-08-080103)  . dextrose 5 % and 0.45% NaCl 10 mL/hr at 06/23/17 1507  . feeding supplement (GLUCERNA 1.2 CAL) 1,000 mL (008-Aug-20180819)  . fentaNYL infusion INTRAVENOUS Stopped (008-Aug-20180251)  . heparin 1,000 Units/hr (0Aug 08, 20180800)  . heparin    . norepinephrine (LEVOPHED) Adult infusion 40 mcg/min (008-Aug-20181000)  . phenylephrine (NEO-SYNEPHRINE) Adult infusion 400 mcg/min (008-08-181000)  . dialysis replacement fluid (prismasate) 1,500 mL/hr at 008-08-20180833  . dialysis replacement fluid (prismasate) 1,000 mL/hr at 008-08-20180825  . dialysate (PRISMASATE) 1,500 mL/hr at 008-08-181029  .  sodium bicarbonate (isotonic) infusion in sterile water 125 mL/hr at 008/08/180634  . sodium chloride    . vasopressin (PITRESSIN) infusion - *FOR SHOCK* 0.03 Units/min (008/08/181000)   PRN Meds:.sodium chloride, acetaminophen (TYLENOL) oral liquid 160 mg/5 mL, artificial tears, fentaNYL, heparin, heparin, ibuprofen, metoprolol tartrate, midazolam, sodium chloride flush  ABG    Component Value Date/Time   PHART 7.095 (LL) 008/08/180600   PCO2ART 40.4 0Aug 08, 20180600   PO2ART 74.0 (L) 008-Aug-20180600   HCO3 12.2 (L) 02018-08-080600   TCO2 31 07/15/2017 0841   ACIDBASEDEF  15.9 (H) 27-Jun-2017 0600   O2SAT 92.6 2017-06-27 0600    A/P  1. Dialysis dependent ARF in setting of sepsis and contrast induced nephropathy.  Prognosis remains poor due to ongoing hypotension despite 3 pressors and ongoing metabolic acidosis 2. Necrotizing Klebsiella infection with sepsis- on ceftriaxone. Right acute parotitis with extensive facial cellulitis and parapharyngeal abscess. 3. Sepsis- on neo, levophed, and vasopressin drips 4. NSTEMI with Afib and RVR- off of amio 5. VDRF- per PCCM 6. Hep C gnotype 1 7. DM 8. Metabolic acidosis- will change replacement fluids to isotonic bicarb and  follow 9. Anemia- transfuse prn 10. Anasarca- volume removal as tolerated 11. Disposition- poor overall prognosis.  PCCM to discuss with family.   Donetta Potts, MD [place] Pager [phone] Office [phone]

## 2017-07-18 NOTE — Progress Notes (Addendum)
ANTICOAGULATION CONSULT NOTE - Initial Consult  Pharmacy Consult for Heparin Indication: chest pain/ACS  No Known Allergies  Patient Measurements: Height: 5' (152.4 cm) Weight: 159 lb 9.8 oz (72.4 kg) IBW/kg (Calculated) : 50 Heparin Dosing Weight: 66.25 kg  Vital Signs: Temp: 95.6 F (35.3 C) (07/09 0401) Temp Source: Oral (07/09 0401) BP: 103/65 (07/09 0512) Pulse Rate: 120 (07/09 0512)  Labs:  Recent Labs  06/23/17 0616  06/24/17 0345 06/24/17 1600 06/24/17 1731 06/24/17 2318 Jul 16, 2017 0025 2017/07/16 0454  HGB 7.7*  --  7.1*  --   --   --  7.6*  --   HCT 23.8*  --  22.4*  --   --   --  25.4*  --   PLT 125*  --  142*  --   --   --  238  --   HEPARINUNFRC  --   --   --   --   --   --   --  0.10*  CREATININE 4.25*  < > 2.58* 2.00*  --   --  1.87*  --   TROPONINI  --   --   --   --  0.20* 0.08*  --   --   < > = values in this interval not displayed.  Estimated Creatinine Clearance: 35.5 mL/min (A) (by C-G formula based on SCr of 1.87 mg/dL (H)).   Medical History: Past Medical History:  Diagnosis Date  . Diabetes mellitus without complication ([place])   . Hepatitis C   . Hypertension     Medications:  Scheduled:  . aspirin  81 mg Per Tube Daily  . chlorhexidine gluconate (MEDLINE KIT)  15 mL Mouth Rinse BID  . Chlorhexidine Gluconate Cloth  6 each Topical Daily  . feeding supplement (PRO-STAT SUGAR FREE 64)  30 mL Per Tube TID  . insulin aspart  2-11 Units Subcutaneous Q4H  . labetalol  10 mg Intravenous Once  . mouth rinse  15 mL Mouth Rinse 10 times per day  . nystatin   Topical BID  . pantoprazole sodium  40 mg Per Tube Daily  . sodium chloride flush  10-40 mL Intracatheter Q12H   Infusions:  . sodium chloride 250 mL (06/12/2017 1500)  . cefTAZidime (FORTAZ)  IV Stopped (2017-07-16 0340)  . ciprofloxacin Stopped (07-16-2017 0103)  . dextrose 5 % and 0.45% NaCl 10 mL/hr at 06/23/17 1507  . feeding supplement (GLUCERNA 1.2 CAL) Stopped (06/24/17 1100)  .  fentaNYL infusion INTRAVENOUS Stopped (2017/07/16 0251)  . heparin 800 Units/hr (06/24/17 1756)  . heparin    . norepinephrine (LEVOPHED) Adult infusion 35 mcg/min (07/16/17 0244)  . phenylephrine (NEO-SYNEPHRINE) Adult infusion 400 mcg/min (2017-07-16 0527)  . dialysis replacement fluid (prismasate) 1,500 mL/hr at 2017-07-16 0526  . dialysis replacement fluid (prismasate) 1,000 mL/hr at 07/16/2017 0439  . dialysate (PRISMASATE) 1,500 mL/hr at 07/16/2017 0300  .  sodium bicarbonate (isotonic) infusion in sterile water 100 mL/hr at 07-16-17 0425  . sodium chloride    . vasopressin (PITRESSIN) infusion - *FOR SHOCK* 0.03 Units/min (2017-07-16 0415)    Assessment: 61 yo male with new ST changes and suspected NSTEMI on 7/8. Pharmacy consulted to dose heparin. Hgb labile, currently improved at 7.6 and platelets within normal limits. Recent active facial wound bleeding requiring transfusions due to anemia. Last transfusion on 7/6.   Heparin level subtherapeutic at 0.1 this morning. No interruptions or issues with heparin gtt per RN. No change in bleeding per documentation or by RN report.  Will hold boluses in setting of bleeding.   Goal of Therapy:  Heparin level 0.3-0.5 units/mL Monitor platelets by anticoagulation protocol: Yes   Plan: Increase heparin gtt to 1000 units/hr Heparin level in 6 hrs  Daily heparin level and CBC Monitor for s/s bleeding  Argie Ramming, PharmD Clinical Pharmacist 07-03-2017 5:41 AM

## 2017-07-18 NOTE — Progress Notes (Signed)
Patient's death pronounced at 1334.No heart sounds ascultated per myself and Edison PaceAshley Jarrell RN.Family and interpreter at bedside.

## 2017-07-18 NOTE — Progress Notes (Signed)
200 ml fentanyl wasted in sink. Witnessed by Acey LavPhyllis McKinney RN

## 2017-07-18 NOTE — Consult Note (Signed)
WOC consult requested to assess buttocks for possible pressure injury or moisture associated skin damage.  He is extremely unstable and critically ill on multiple pressors for decreased blood pressure and is unable to tolerate turning to visualize skin at this time. If there is a pressure injury; it was present on admission.  Pt is on a Sport low airloss bed to reduce pressure.  Will attempt to assess buttocks area tomorrow if patient is more stable at that time, and bedside nurse states she will look at his skin if they are able to turn him later today. Cammie Mcgeeawn Earl Zellmer MSN, RN, CWOCN, [place]-AP, CNS [phone]

## 2017-07-18 NOTE — Progress Notes (Signed)
RT did not perform CPT on patient due to pt being unstable at this time.

## 2017-07-18 NOTE — Progress Notes (Signed)
Versed 20 ml was wasted in the sink. Cheryl FlashJohny Kolangayil, RN witnessed.

## 2017-07-18 NOTE — Progress Notes (Signed)
Patient removed off of vent due to end of life withdrawal order. RT placed patient on a 28% ATC per MD. Family and RN at bedside during removal of ventilator.

## 2017-07-18 NOTE — Progress Notes (Signed)
eLink Physician-Brief Progress Note Patient Name: [name] [name] DOB: 1956-08-04 MRN: [id number]   Date of Service  07/08/2017  HPI/Events of Note  Severe acidosis + rhythm changes Increasing pressors  eICU Interventions  A-line/ ABG obtainde to verify Bicarb 2 amps push then gtt @ 100/h Rpt calcium x 1 Add vaso gtt High bicarb bath on CRRT     Intervention Category Major Interventions: Acid-Base disturbance - evaluation and management  ALVA,RAKESH V. 06/28/2017, 4:09 AM

## 2017-07-18 NOTE — Progress Notes (Signed)
Cardiology consulted due to rhythm change and troponin elevation. Electrolytes replaced.   [name] [name], AGACNP-BC [facility]  Pgr: [phone]  PCCM Pgr: [phone]

## 2017-07-18 NOTE — Progress Notes (Signed)
   Subjective:    Patient ID: [name] [name], male    DOB: 11-18-1956, 61 y.o.   MRN: [id number]  HPI Remains sedated on ventilator.  Becoming more septic.  Requiring hemodialysis. Review of Systems     Objective:   Physical Exam Did not change dressings.       Assessment & Plan:  Neck and facial necrotizing fasciitis, sepsis.  I had thorough discussion with wife and family via interpreter and answered questions.  They have decided to withdraw care.

## 2017-07-18 NOTE — Progress Notes (Signed)
ABG for 0330 and 6am results given to Dr. Vassie LollAlva orders given and carried out.

## 2017-07-18 NOTE — Progress Notes (Signed)
PULMONARY / CRITICAL CARE MEDICINE   Name: [name] [name] MRN: [id number] DOB: Jul 03, 1956    ADMISSION DATE:  06/13/2017 CONSULTATION DATE:  06/10/2017  REFERRING MD:  Dr. Thereasa Solo  CHIEF COMPLAINT:  Right parapharyngeal and neck abscesses  HISTORY OF PRESENT ILLNESS:   61 y.o.male with PMH as below, which is significant for DM, HTN, and Hepatitis C. Was in his usual state of health until Friday 6/15 when he noticed some minor R sided facial swelling which worsened in the following days and he developed associated fevers/chills, dyspnea, and dysphagia. He presented to urgent care 6/17 and was prescribed Clindamycin. He took his first dose, but was unable to take any subsequent doses due to dysphagia. He presented to [facility] ED 6/18 for this complaint. CT scan demonstrated right parotitis and facial cellulitis. Due to concern for airway compromise, PCCM asked to evaluate for admission. He was found to have parapharyngeal abscess and underwent I&D and trach 6/25.   SUBJECTIVE:  Overnight had severe acidosis and rhythm changes requiring increased pressors. Heparin was started for concern of possible STEMI. Less responsive this morning.  VITAL SIGNS: BP 101/75   Pulse (!) 117   Temp (!) 95.6 F (35.3 C) (Oral)   Resp 18   Ht 5' (1.524 m)   Wt 72.4 kg (159 lb 9.8 oz)   SpO2 96%   BMI 31.17 kg/m   HEMODYNAMICS:    VENTILATOR SETTINGS: Vent Mode: PRVC FiO2 (%):  [40 %] 40 % Set Rate:  [20 bmp-32 bmp] 32 bmp Vt Set:  [400 mL] 400 mL PEEP:  [10 cmH20] 10 cmH20 Plateau Pressure:  [23 cmH20-32 cmH20] 25 cmH20  INTAKE / OUTPUT: I/O last 3 completed shifts: In: 6739.8 [I.V.:4799.8; NG/GT:960; IV Piggyback:980] Out: 5573 [UKGUR:4270; Stool:50]  PHYSICAL EXAMINATION: General:  Laying in bed, on vent. Warmer placed. Neuro:  Sedated. L pupil responsive but R pupil dilated and sluggish/nonresponsive HEENT:  Dressings on R side of face c/d/i Cardiovascular:  Irregular, no  murmurs Lungs:  Coarse breath sounds, poor air movement Abdomen:  Soft, nontender, + bowel sounds Musculoskeletal:  Normal bulk, pitting edema in LE  Skin:  Jaundiced with dressings on R side of face  LABS:  BMET  Recent Labs Lab 06/24/17 0345 06/24/17 1600 2017/06/26 0025  NA 139 136 132*  K 4.2 5.1 5.3*  CL 106 105 103  CO2 25 22 14*  BUN 82* 50* 41*  CREATININE 2.58* 2.00* 1.87*  GLUCOSE 126* 134* 123*    Electrolytes  Recent Labs Lab 06/21/17 0540  06/24/17 0345 06/24/17 1600 June 26, 2017 0025  CALCIUM 7.3*  < > 7.4* 7.4* 7.3*  MG 2.5*  --  2.3  --  2.3  PHOS 7.0*  < > 4.8* 4.7* 6.4*  < > = values in this interval not displayed.  CBC  Recent Labs Lab 06/23/17 0616 06/24/17 0345 2017/06/26 0025  WBC 12.3* 16.2* 26.4*  HGB 7.7* 7.1* 7.6*  HCT 23.8* 22.4* 25.4*  PLT 125* 142* 238    Coag's No results for input(s): APTT, INR in the last 168 hours.  Sepsis Markers No results for input(s): LATICACIDVEN, PROCALCITON, O2SATVEN in the last 168 hours.  ABG  Recent Labs Lab 06/24/17 1645 06/26/17 0330 06/26/17 0600  PHART 7.187* 6.979* 7.095*  PCO2ART 55.9* 37.5 40.4  PO2ART 89.9 77.0* 74.0*    Liver Enzymes  Recent Labs Lab 06/20/17 0419  06/22/17 0548 06/23/17 1645 06/24/17 0345 06/24/17 1600 June 26, 2017 0025  AST 124*  --  120* 89*  --   --   --  ALT 22  --  22 <5*  --   --   --   ALKPHOS 219*  --  302* 292*  --   --   --   BILITOT 3.6*  --  8.0* 8.4*  --   --   --   ALBUMIN <1.0*  < > <1.0* <1.0*  <1.0* <1.0* <1.0* <1.0*  < > = values in this interval not displayed.  Cardiac Enzymes  Recent Labs Lab 06/24/17 1731 06/24/17 2318  TROPONINI 0.20* 0.08*    Glucose  Recent Labs Lab 06/24/17 0801 06/24/17 1232 06/24/17 1548 06/24/17 1936 06/24/17 2358 28-Jun-2017 0422  GLUCAP 118* 100* 123* 105* 97 83    Imaging No results found.   STUDIES:  CT Neck 6/18 >Diffuse facial swelling favored to represent bacterial R parotitis.  Significant facial cellulitis, with R muscle of mastication enlargement. No visible calculi. No drainable abscess. Minor dental caries and periapical lucencies, not felt to be the source of inflammation. CXR 6/22 >Low lung volumes with right basilar atelectasis.  CT neck 6/22 >Extensive severe multi compartmental infectious/inflammatory process from right periorbital soft tissues to the upper neck. Inflammation involves the all right-sided deep cervical compartments including right parapharyngeal and retropharyngeal spaces. There multiple ill-defined fluid collections probably representing phlegmon. No discrete rim enhancing abscess is Identified. There is rightward displacement of the airway and moderate narrowing of the airway due to fluid collections in parapharyngeal space and swelling of oral and hypopharyngeal mucosa. The origin of infection is uncertain, possibly odontogenic given the presence of odontogenic disease. CT Neck 6/25 >Continued worsening of inflammatory disease of the right face and deep spaces. Most notable changes include enlargement of the right parapharyngeal abscess now measuring up to 3.5 cm in diameter and extending over a length of almost 6 cm with worsened mass effect upon the hypopharynx and pharynx.  CT chest 7/6 > dense consolidation in right lung, trace effusion, patchy consolidation left lung Abd [place] RUQ 7/9 > tumefactive sludge in gallbladder, no definitive gallstones, no wall thickening. Mild irregularity of liver contour. Possible cirrhosis with mild ascites.   CULTURES: 6/18 Blood >No growth  6/18 MRSA PCR >Negative  6/25 MRSA PCP > Negative 6/25 Staphy aureus >Negative  6/25 Aerobic/Anareboic Culture > Kleb P. Res to ampicillin  6/28 Blood culture> NG x5d 7/1 OR > klebsiella 7/1 abscess fungus >> 7/2 Bronch bal > Klebsiella 7/6 bronchial aspirate > Klebsiella  7/6 bronchial aspirate fungus>> 7/6 bronchial aspirate afb > neg 7/7 urine culture > 100K  colonies yeast 7/7 blood culture > NG x 1 d  ANTIBIOTICS: Clindamycin 6/18 - 6/23 Unasyn 6/18 - 6/22 Vancomycin 6/23 - 6/28 unasyn 6/27- 7/1 Zosyn 6/23 - 6/27 meropenem 7/1- 7/3 Ceftazidine 7/3 -7/4 Cefazolin 7/4 - 7/7 Ceftazidine 7/7 >> Cipro 7/7 >>  IVIG 7/1>>> x 1, further held crt rise  SIGNIFICANT EVENTS: 6/18 Presents to ED 6/25 Taken to OR for I&D and Trach  6/29 fevers, vent 6/30 bleeding, blood, cauterized bedside 7/1 fib rvr, borderline BP, to OR 7/8 elevated troponin with ST changes  LINES/TUBES: Trach 6/25 >> Foley 6/25 > NG/OG 6/25 >  L femoral CVC 6/25 > 7/6 Left fem cvc 7/1 > 7/6 PICC 6/27 > R fem HD cath 7/7 > A line L radial 7/9 > PIV  DISCUSSION: 62 year old male presented to ED on 6/18 with right facial cellulitis/parotitis and parapharyngeal abscess s/p I&D and trach on 6/25. Has had multiple debridements since.  Has Klebsiella growing in multiple  sources (lungs, facial abscesses).  No known immunocompromised state.  Received 1 dose of IVIg which was associated with ATN and now AKI necessitating hemodialysis.  Remains mechanically ventilated and heavily sedated.  As of 7/6 his bronch cultures still show Klebsiella.  ASSESSMENT / PLAN:  PULMONARY A: Acute respiratory failure with hypoxemia and severe acidosis in setting of facial cellulitis/focal abscess s/p trach  Severe Klebsiella HCAP P: Full mechanical vent support VAP prevention Daily SBT and WUA  Pulm toilette measures  Continue bicarb gtt  CARDIOVASCULAR A:  NSTEMI with bigeminy/trigeminy AF RVR> resolved HTN Hypotension, on Neo and Vasopressin Bradycardia P: Hold amiodarone Continue pressors for MAP > 65 Tele Heparin gtt   Stress dose steroids PRN lopressor for rate control   A:  AKI/ATN > improving with CVVHD Anasarca P: Continue volume removal as tolerated with CVVHD Monitor BMET and UOP Replace electrolytes as needed   GASTROINTESTINAL A:  Hep C  genotype 1 with splenomegaly - ? Untreated Protein calorie malnutrition Obstructive jaundice pattern> case reports in literature P: RUQ ultrasound showing no liver abscesses Continue PPI Continue tube feedings  HEMATOLOGIC A:  Anemia> s/p transfusion PRBC 7/5 Very high DVT risk P: Transfuse for Hgb < 7gm/dL Continue sub q heparin  INFECTIOUS A:  Right Acute Parotitis with extensive facial cellulitis and focal right parapharyngeal abscess s/p I&D with Klebsiella P. Res to ampicillin - necrotizing Klebsiella PNA  HCAP with Klebsiella on repeat bronchoscopy 7/6 Fungus on biopsy> just topical candida per ID review of images P: Infectious disease following Continue Ceftaz (day 3) and Cipro (day 2), total 22 days of ABX  Agree with sending for K1 genotype given case reports of severe necrotizing klebsiella infection in [place] patients with chronic viral hepatitis Follow cultures  ENDOCRINE A:  Diabetes Hypoglycemia s/p amp D5  P: Continue SSI with tube feeding coverage Monitor CBGs  NEUROLOGIC A:  Acute Encephalopathy secondary to sedation > heavily sedated 7/8 Pain severe, requiring limited WUA P: RASS goal: -3 PRN Versed PRN fentanyl, stopped gtt   FAMILY  - Updates: Family at bedside  Bufford Lope, DO PGY-2, [facility] 09-Jul-2017 7:31 AM

## 2017-07-18 NOTE — Progress Notes (Signed)
245 mls morphine wasted and [facility] FulkRN

## 2017-07-18 NOTE — Progress Notes (Signed)
CBG 27 and 25 per different sites 50 mls Dextrose 50% given per Hypoglycemic protocol  Lactic Acid 12.8 and patient's temp is 95 F despite blood warmer on CRRT and bair Hugger on patient . Right pupil is larger than left and nonreactive on assessment unsure if this is a change due to previous inability to assess secondary to swelling . Dr. Rich ReiningManeem informed of this assessment and lab results .Patient's wife is at bedside and I also informed her of changes in her husband"s condition and how unstable he is . She refuses interpreter and states she understands however I told her I will get the interpreter to explain also

## 2017-07-18 DEATH — deceased

## 2017-07-27 LAB — FUNGAL ORGANISM REFLEX

## 2017-07-27 LAB — FUNGUS CULTURE WITH STAIN

## 2017-07-27 LAB — FUNGUS CULTURE RESULT

## 2017-08-06 LAB — ACID FAST CULTURE WITH REFLEXED SENSITIVITIES (MYCOBACTERIA): Acid Fast Culture: NEGATIVE

## 2019-01-11 IMAGING — CT CT NECK W/ CM
4 of 6 series · 14 of 33 positions shown, 16 images · IV contrast (iopamidol)
Comparison: None.

CLINICAL DATA: RIGHT-sided facial swelling.

EXAM:
CT NECK WITH CONTRAST
TECHNIQUE: Multidetector CT imaging of the neck was performed using the
standard protocol following the bolus administration of intravenous
contrast.
CONTRAST:  75mL [id number] IOPAMIDOL ([id number]) INJECTION 61%

[Series 3: neck 2.0 st · axial · 0.42mm/px · z∈[-262,-142]mm · 3 of 120 slices shown, 4 images (1 of 3)]
[im 30/120  soft-tissue]
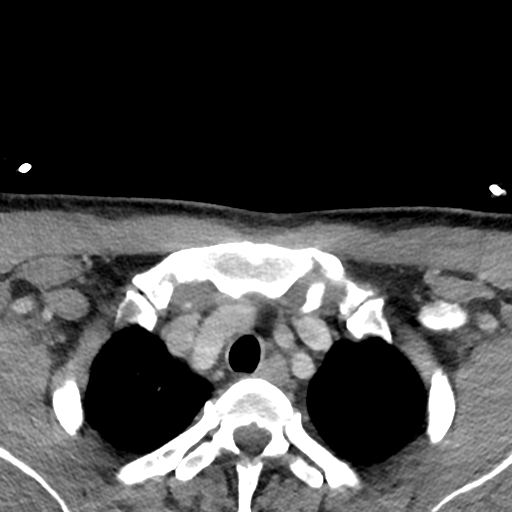
[im 30/120  bone]
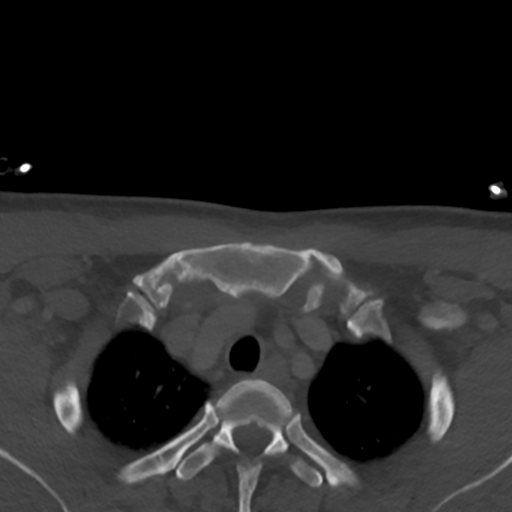
[im 60/120  bone]
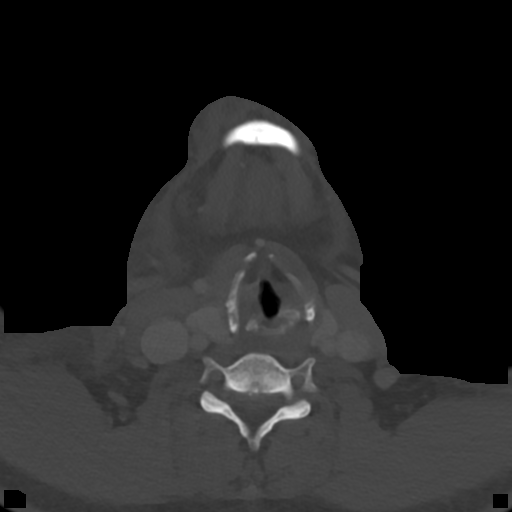
[im 90/120  bone]
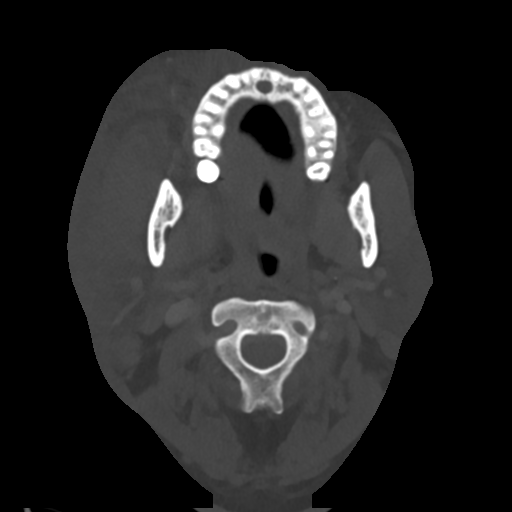

[Series 5: neck 2.0 st · sagittal · 0.47mm/px · 5 of 81 slices shown, 6 images (2 of 3)]
[im 27/81  bone]
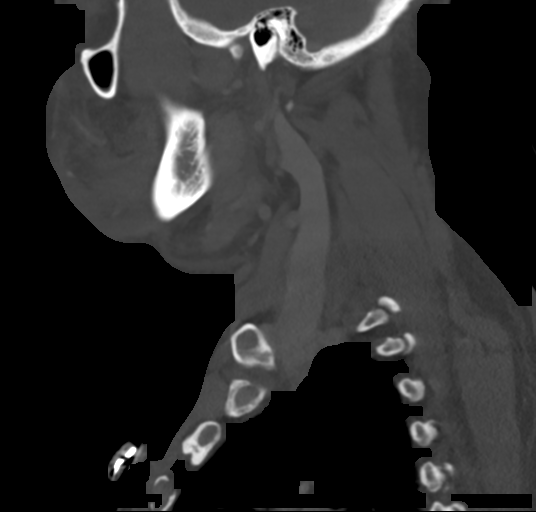
[im 34/81  bone]
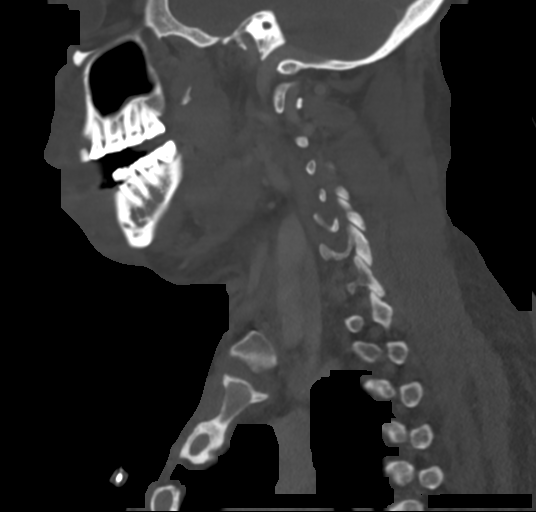
[im 41/81  soft-tissue]
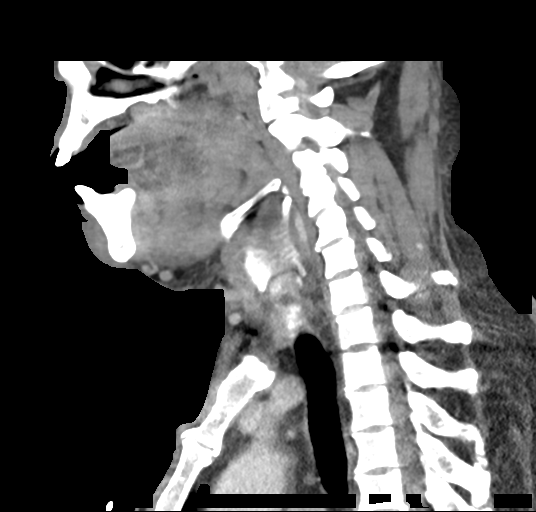
[im 41/81  bone]
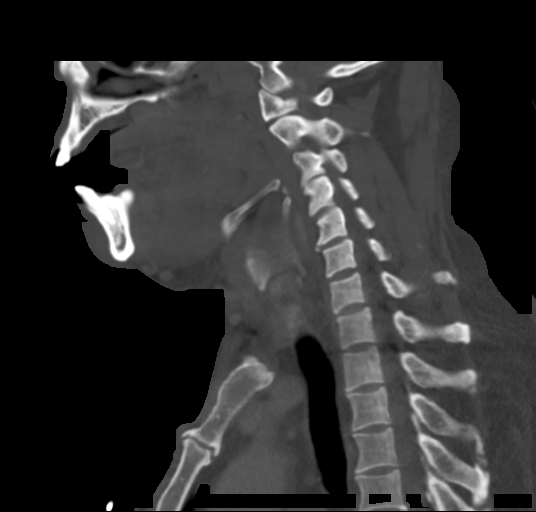
[im 47/81  bone]
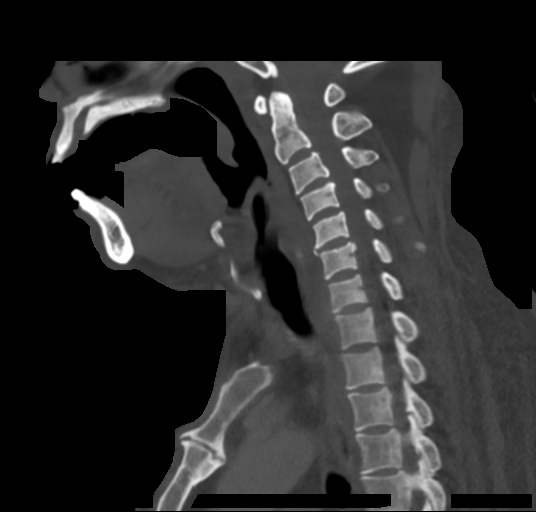
[im 54/81  bone]
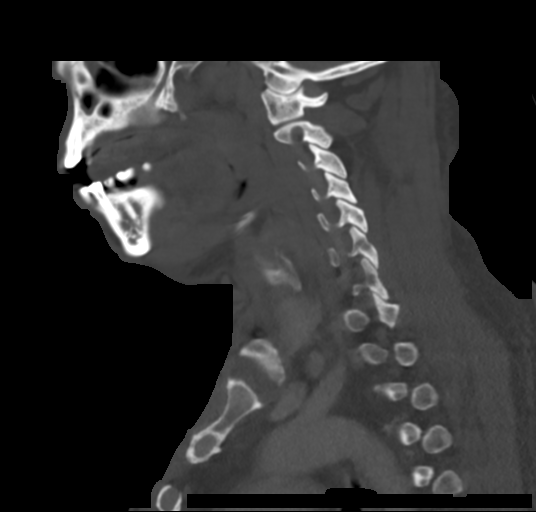

[Series 6: neck 2.0 st · coronal · 0.52mm/px · 3 of 106 slices shown (3 of 3)]
[im 25/106  bone]
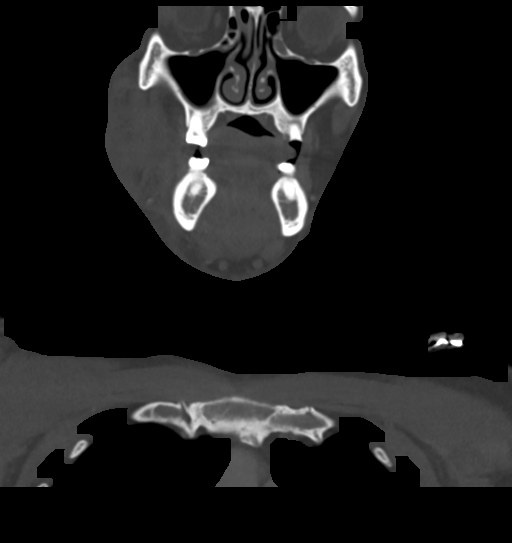
[im 44/106  bone]
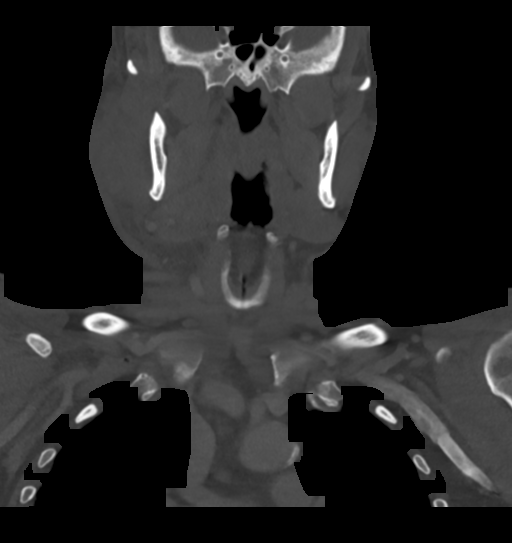
[im 62/106  bone]
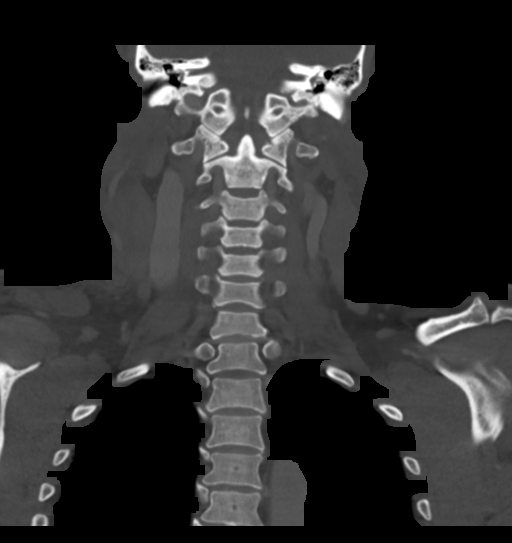

[Series 7: neck 2.0 st orthogonal · axial · 0.39mm/px · z∈[-295,-177]mm · 3 of 127 slices shown]
[im 32/127  bone]
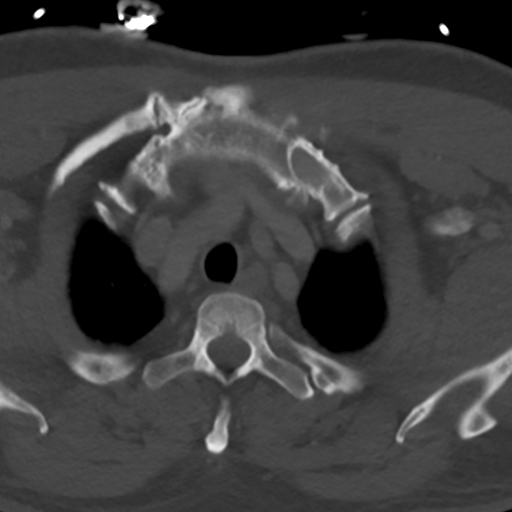
[im 64/127  bone]
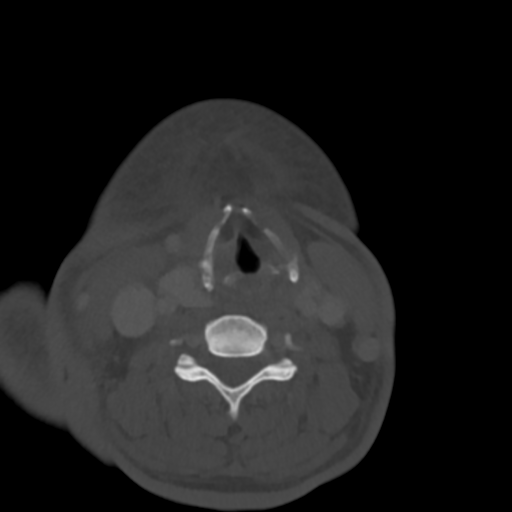
[im 95/127  bone]
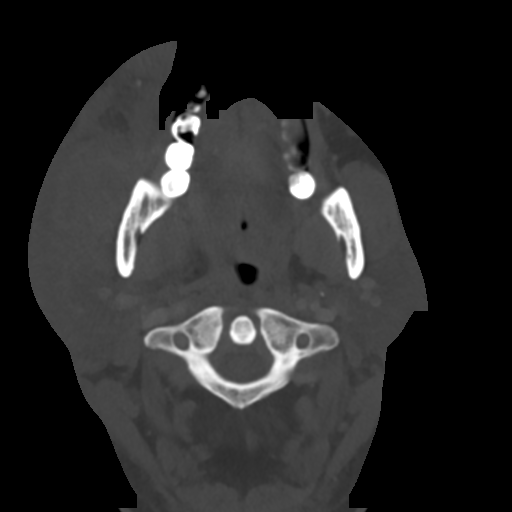

[14 of 33 positions shown; findings below may reference images not displayed]

FINDINGS: Pharynx and larynx: Mild extrinsic mass effect on the pharynx and
airway, due to RIGHT-sided inflammation

Salivary glands: Diffusely enlarged RIGHT parotid gland, marked
surrounding edema, extending downward over the RIGHT face. The RIGHT
masseter muscle is enlarged. LEFT parotid gland is normal. LEFT and
RIGHT submandibular glands do not show inflammation although the
RIGHT submandibular gland is surrounded by fluid. No calculi.

Thyroid: Normal.

Lymph nodes: Reactive lymph nodes on the RIGHT, not pathologically
enlarged.

Vascular: Unremarkable.

Limited intracranial: Negative.

Visualized orbits: Negative.

Mastoids and visualized paranasal sinuses: Minimal ethmoid sinus
fluid.

Skeleton: Unremarkable. Relatively minor dental caries and
periapical lucencies, out of proportion to the degree of
inflammation.

Upper chest: No acute findings.

Other: None.
IMPRESSION: Diffuse facial swelling favored to represent bacterial RIGHT
parotitis. Significant facial cellulitis, with RIGHT muscle of
mastication enlargement. No visible calculi. No drainable abscess.

Minor dental caries and periapical lucencies, not felt to be the
source of inflammation.

Findings discussed with ordering provider
